# Patient Record
Sex: Female | Born: 1935 | Race: White | Hispanic: No | Smoking: Former smoker
Health system: Southern US, Community
[De-identification: ages and names within clinical notes are randomized; demographics above are authoritative.]

## PROBLEM LIST (undated history)

## (undated) DIAGNOSIS — J45909 Unspecified asthma, uncomplicated: Secondary | ICD-10-CM

## (undated) DIAGNOSIS — Z860100 Personal history of colon polyps, unspecified: Secondary | ICD-10-CM

## (undated) DIAGNOSIS — R319 Hematuria, unspecified: Secondary | ICD-10-CM

## (undated) DIAGNOSIS — K222 Esophageal obstruction: Secondary | ICD-10-CM

## (undated) DIAGNOSIS — M109 Gout, unspecified: Secondary | ICD-10-CM

## (undated) DIAGNOSIS — D649 Anemia, unspecified: Secondary | ICD-10-CM

## (undated) DIAGNOSIS — K589 Irritable bowel syndrome without diarrhea: Secondary | ICD-10-CM

## (undated) DIAGNOSIS — E119 Type 2 diabetes mellitus without complications: Secondary | ICD-10-CM

## (undated) DIAGNOSIS — G4730 Sleep apnea, unspecified: Secondary | ICD-10-CM

## (undated) DIAGNOSIS — C189 Malignant neoplasm of colon, unspecified: Secondary | ICD-10-CM

## (undated) DIAGNOSIS — I10 Essential (primary) hypertension: Secondary | ICD-10-CM

## (undated) DIAGNOSIS — T8859XA Other complications of anesthesia, initial encounter: Secondary | ICD-10-CM

## (undated) DIAGNOSIS — E039 Hypothyroidism, unspecified: Secondary | ICD-10-CM

## (undated) DIAGNOSIS — R0600 Dyspnea, unspecified: Secondary | ICD-10-CM

## (undated) DIAGNOSIS — R079 Chest pain, unspecified: Secondary | ICD-10-CM

## (undated) DIAGNOSIS — E785 Hyperlipidemia, unspecified: Secondary | ICD-10-CM

## (undated) DIAGNOSIS — Z8739 Personal history of other diseases of the musculoskeletal system and connective tissue: Secondary | ICD-10-CM

## (undated) DIAGNOSIS — C801 Malignant (primary) neoplasm, unspecified: Secondary | ICD-10-CM

## (undated) DIAGNOSIS — Z87898 Personal history of other specified conditions: Secondary | ICD-10-CM

## (undated) DIAGNOSIS — I350 Nonrheumatic aortic (valve) stenosis: Secondary | ICD-10-CM

## (undated) HISTORY — PX: CHOLECYSTECTOMY: SHX55

## (undated) HISTORY — PX: COLONOSCOPY: SHX174

## (undated) HISTORY — PX: ESOPHAGOGASTRODUODENOSCOPY: SHX1529

## (undated) HISTORY — PX: CARDIAC PACEMAKER PLACEMENT: SHX583

## (undated) HISTORY — PX: AORTIC VALVE REPLACEMENT: SHX41

---

## 1995-02-08 HISTORY — PX: COLON SURGERY: SHX602

## 2004-05-20 ENCOUNTER — Emergency Department: Payer: Self-pay | Admitting: Emergency Medicine

## 2004-06-08 ENCOUNTER — Ambulatory Visit: Payer: Self-pay

## 2004-07-08 ENCOUNTER — Ambulatory Visit: Payer: Self-pay | Admitting: Internal Medicine

## 2004-07-30 ENCOUNTER — Ambulatory Visit: Payer: Self-pay | Admitting: Internal Medicine

## 2004-08-07 ENCOUNTER — Ambulatory Visit: Payer: Self-pay | Admitting: Internal Medicine

## 2005-09-13 ENCOUNTER — Ambulatory Visit: Payer: Self-pay | Admitting: Internal Medicine

## 2005-09-16 ENCOUNTER — Ambulatory Visit: Payer: Self-pay | Admitting: Internal Medicine

## 2005-12-02 ENCOUNTER — Ambulatory Visit: Payer: Self-pay | Admitting: Unknown Physician Specialty

## 2006-03-13 ENCOUNTER — Ambulatory Visit: Payer: Self-pay | Admitting: Internal Medicine

## 2006-04-03 ENCOUNTER — Ambulatory Visit: Payer: Self-pay | Admitting: Vascular Surgery

## 2006-05-01 ENCOUNTER — Ambulatory Visit: Payer: Self-pay | Admitting: General Surgery

## 2006-12-15 ENCOUNTER — Ambulatory Visit: Payer: Self-pay | Admitting: General Surgery

## 2007-12-24 ENCOUNTER — Ambulatory Visit: Payer: Self-pay | Admitting: Internal Medicine

## 2008-11-14 ENCOUNTER — Ambulatory Visit: Payer: Self-pay | Admitting: Otolaryngology

## 2008-12-25 ENCOUNTER — Ambulatory Visit: Payer: Self-pay | Admitting: Internal Medicine

## 2009-12-28 ENCOUNTER — Ambulatory Visit: Payer: Self-pay | Admitting: Internal Medicine

## 2010-02-24 ENCOUNTER — Inpatient Hospital Stay: Payer: Self-pay | Admitting: Internal Medicine

## 2011-01-06 ENCOUNTER — Ambulatory Visit: Payer: Self-pay | Admitting: Internal Medicine

## 2011-03-07 ENCOUNTER — Ambulatory Visit: Payer: Self-pay | Admitting: Unknown Physician Specialty

## 2012-01-19 ENCOUNTER — Ambulatory Visit: Payer: Self-pay | Admitting: Internal Medicine

## 2013-01-21 ENCOUNTER — Ambulatory Visit: Payer: Self-pay | Admitting: Family Medicine

## 2013-02-05 ENCOUNTER — Ambulatory Visit: Payer: Self-pay | Admitting: Internal Medicine

## 2013-03-06 DIAGNOSIS — E782 Mixed hyperlipidemia: Secondary | ICD-10-CM | POA: Insufficient documentation

## 2013-03-08 DIAGNOSIS — Z952 Presence of prosthetic heart valve: Secondary | ICD-10-CM | POA: Insufficient documentation

## 2013-03-08 HISTORY — PX: TEE WITHOUT CARDIOVERSION: SHX5443

## 2013-03-13 DIAGNOSIS — I442 Atrioventricular block, complete: Secondary | ICD-10-CM | POA: Insufficient documentation

## 2013-03-13 DIAGNOSIS — I444 Left anterior fascicular block: Secondary | ICD-10-CM | POA: Insufficient documentation

## 2013-03-21 ENCOUNTER — Encounter: Payer: Self-pay | Admitting: Internal Medicine

## 2013-03-26 LAB — BASIC METABOLIC PANEL
ANION GAP: 8 (ref 7–16)
BUN: 37 mg/dL — ABNORMAL HIGH (ref 7–18)
CHLORIDE: 105 mmol/L (ref 98–107)
CREATININE: 1.49 mg/dL — AB (ref 0.60–1.30)
Calcium, Total: 8.9 mg/dL (ref 8.5–10.1)
Co2: 26 mmol/L (ref 21–32)
EGFR (Non-African Amer.): 34 — ABNORMAL LOW
GFR CALC AF AMER: 39 — AB
GLUCOSE: 83 mg/dL (ref 65–99)
Osmolality: 285 (ref 275–301)
POTASSIUM: 3.9 mmol/L (ref 3.5–5.1)
Sodium: 139 mmol/L (ref 136–145)

## 2013-03-26 LAB — PROTIME-INR
INR: 3.4
Prothrombin Time: 33.2 secs — ABNORMAL HIGH (ref 11.5–14.7)

## 2013-03-29 LAB — PROTIME-INR
INR: 2.2
Prothrombin Time: 23.8 secs — ABNORMAL HIGH (ref 11.5–14.7)

## 2013-04-02 LAB — PROTIME-INR
INR: 2.1
Prothrombin Time: 23.3 secs — ABNORMAL HIGH (ref 11.5–14.7)

## 2013-04-07 ENCOUNTER — Encounter: Payer: Self-pay | Admitting: Internal Medicine

## 2013-04-26 ENCOUNTER — Emergency Department: Payer: Self-pay | Admitting: Emergency Medicine

## 2013-08-01 LAB — CBC WITH DIFFERENTIAL/PLATELET
Basophil #: 0.1 10*3/uL (ref 0.0–0.1)
Basophil %: 0.6 %
EOS ABS: 0.2 10*3/uL (ref 0.0–0.7)
Eosinophil %: 1 %
HCT: 49.2 % — ABNORMAL HIGH (ref 35.0–47.0)
HGB: 15.7 g/dL (ref 12.0–16.0)
LYMPHS PCT: 5.4 %
Lymphocyte #: 1 10*3/uL (ref 1.0–3.6)
MCH: 26.4 pg (ref 26.0–34.0)
MCHC: 32 g/dL (ref 32.0–36.0)
MCV: 83 fL (ref 80–100)
Monocyte #: 1.6 x10 3/mm — ABNORMAL HIGH (ref 0.2–0.9)
Monocyte %: 8.5 %
NEUTROS PCT: 84.5 %
Neutrophil #: 16.2 10*3/uL — ABNORMAL HIGH (ref 1.4–6.5)
Platelet: 230 10*3/uL (ref 150–440)
RBC: 5.96 10*6/uL — ABNORMAL HIGH (ref 3.80–5.20)
RDW: 16.4 % — ABNORMAL HIGH (ref 11.5–14.5)
WBC: 19.1 10*3/uL — ABNORMAL HIGH (ref 3.6–11.0)

## 2013-08-02 ENCOUNTER — Observation Stay: Payer: Self-pay | Admitting: Internal Medicine

## 2013-08-02 LAB — CBC WITH DIFFERENTIAL/PLATELET
Basophil #: 0 10*3/uL (ref 0.0–0.1)
Basophil %: 0.7 %
EOS ABS: 0.1 10*3/uL (ref 0.0–0.7)
Eosinophil %: 1.4 %
HCT: 38.7 % (ref 35.0–47.0)
HGB: 12.6 g/dL (ref 12.0–16.0)
LYMPHS ABS: 1.9 10*3/uL (ref 1.0–3.6)
Lymphocyte %: 25.9 %
MCH: 26.6 pg (ref 26.0–34.0)
MCHC: 32.5 g/dL (ref 32.0–36.0)
MCV: 82 fL (ref 80–100)
Monocyte #: 1 x10 3/mm — ABNORMAL HIGH (ref 0.2–0.9)
Monocyte %: 13.4 %
NEUTROS ABS: 4.2 10*3/uL (ref 1.4–6.5)
NEUTROS PCT: 58.6 %
Platelet: 200 10*3/uL (ref 150–440)
RBC: 4.74 10*6/uL (ref 3.80–5.20)
RDW: 16.4 % — AB (ref 11.5–14.5)
WBC: 7.2 10*3/uL (ref 3.6–11.0)

## 2013-08-02 LAB — COMPREHENSIVE METABOLIC PANEL
ALK PHOS: 149 U/L — AB
ALT: 56 U/L (ref 12–78)
Albumin: 3.8 g/dL (ref 3.4–5.0)
Anion Gap: 10 (ref 7–16)
BUN: 22 mg/dL — ABNORMAL HIGH (ref 7–18)
Bilirubin,Total: 0.6 mg/dL (ref 0.2–1.0)
CALCIUM: 9.6 mg/dL (ref 8.5–10.1)
CO2: 23 mmol/L (ref 21–32)
Chloride: 104 mmol/L (ref 98–107)
Creatinine: 1.67 mg/dL — ABNORMAL HIGH (ref 0.60–1.30)
EGFR (African American): 34 — ABNORMAL LOW
GFR CALC NON AF AMER: 29 — AB
Glucose: 196 mg/dL — ABNORMAL HIGH (ref 65–99)
Osmolality: 283 (ref 275–301)
Potassium: 4 mmol/L (ref 3.5–5.1)
SGOT(AST): 41 U/L — ABNORMAL HIGH (ref 15–37)
Sodium: 137 mmol/L (ref 136–145)
TOTAL PROTEIN: 8.6 g/dL — AB (ref 6.4–8.2)

## 2013-08-02 LAB — TROPONIN I: Troponin-I: <0.02 ng/mL

## 2013-08-02 LAB — BASIC METABOLIC PANEL
ANION GAP: 6 — AB (ref 7–16)
BUN: 26 mg/dL — ABNORMAL HIGH (ref 7–18)
CALCIUM: 8.2 mg/dL — AB (ref 8.5–10.1)
Chloride: 108 mmol/L — ABNORMAL HIGH (ref 98–107)
Co2: 27 mmol/L (ref 21–32)
Creatinine: 1.72 mg/dL — ABNORMAL HIGH (ref 0.60–1.30)
EGFR (African American): 32 — ABNORMAL LOW
EGFR (Non-African Amer.): 28 — ABNORMAL LOW
GLUCOSE: 103 mg/dL — AB (ref 65–99)
Osmolality: 286 (ref 275–301)
POTASSIUM: 3.7 mmol/L (ref 3.5–5.1)
SODIUM: 141 mmol/L (ref 136–145)

## 2013-08-02 LAB — URINALYSIS, COMPLETE
BILIRUBIN, UR: NEGATIVE
Blood: NEGATIVE
GLUCOSE, UR: NEGATIVE mg/dL (ref 0–75)
Hyaline Cast: 11
KETONE: NEGATIVE
NITRITE: NEGATIVE
Ph: 5 (ref 4.5–8.0)
Protein: 25
RBC,UR: 3 /HPF (ref 0–5)
Specific Gravity: 1.015 (ref 1.003–1.030)
Squamous Epithelial: 1

## 2013-08-02 LAB — AMYLASE: AMYLASE: 112 U/L (ref 25–115)

## 2013-08-02 LAB — PROTIME-INR
INR: 1.5
INR: 1.4
PROTHROMBIN TIME: 17 s — AB (ref 11.5–14.7)
Prothrombin Time: 17.7 secs — ABNORMAL HIGH (ref 11.5–14.7)

## 2013-08-02 LAB — LIPASE, BLOOD
LIPASE: 877 U/L — AB (ref 73–393)
Lipase: 161 U/L (ref 73–393)

## 2013-08-02 LAB — CLOSTRIDIUM DIFFICILE(ARMC)

## 2013-08-03 LAB — CBC WITH DIFFERENTIAL/PLATELET
BASOS ABS: 0.1 10*3/uL (ref 0.0–0.1)
Basophil %: 1.3 %
Eosinophil #: 0.3 10*3/uL (ref 0.0–0.7)
Eosinophil %: 6.9 %
HCT: 36.5 % (ref 35.0–47.0)
HGB: 11.9 g/dL — AB (ref 12.0–16.0)
LYMPHS PCT: 36.6 %
Lymphocyte #: 1.8 10*3/uL (ref 1.0–3.6)
MCH: 26.8 pg (ref 26.0–34.0)
MCHC: 32.5 g/dL (ref 32.0–36.0)
MCV: 82 fL (ref 80–100)
MONO ABS: 0.6 x10 3/mm (ref 0.2–0.9)
Monocyte %: 12.2 %
NEUTROS PCT: 43 %
Neutrophil #: 2.1 10*3/uL (ref 1.4–6.5)
PLATELETS: 168 10*3/uL (ref 150–440)
RBC: 4.44 10*6/uL (ref 3.80–5.20)
RDW: 16.6 % — AB (ref 11.5–14.5)
WBC: 4.9 10*3/uL (ref 3.6–11.0)

## 2013-08-03 LAB — BASIC METABOLIC PANEL
Anion Gap: 8 (ref 7–16)
BUN: 20 mg/dL — ABNORMAL HIGH (ref 7–18)
CHLORIDE: 112 mmol/L — AB (ref 98–107)
Calcium, Total: 8.3 mg/dL — ABNORMAL LOW (ref 8.5–10.1)
Co2: 23 mmol/L (ref 21–32)
Creatinine: 1.34 mg/dL — ABNORMAL HIGH (ref 0.60–1.30)
EGFR (African American): 44 — ABNORMAL LOW
EGFR (Non-African Amer.): 38 — ABNORMAL LOW
GLUCOSE: 87 mg/dL (ref 65–99)
Osmolality: 287 (ref 275–301)
POTASSIUM: 3.7 mmol/L (ref 3.5–5.1)
Sodium: 143 mmol/L (ref 136–145)

## 2013-08-03 LAB — MAGNESIUM: MAGNESIUM: 1.8 mg/dL

## 2013-08-03 LAB — PROTIME-INR
INR: 1.6
Prothrombin Time: 18.4 secs — ABNORMAL HIGH (ref 11.5–14.7)

## 2013-08-03 LAB — LIPASE, BLOOD: Lipase: 113 U/L (ref 73–393)

## 2013-08-07 LAB — CULTURE, BLOOD (SINGLE)

## 2013-09-29 ENCOUNTER — Emergency Department: Payer: Self-pay | Admitting: Student

## 2013-09-29 LAB — COMPREHENSIVE METABOLIC PANEL
ALBUMIN: 3.2 g/dL — AB (ref 3.4–5.0)
ALT: 40 U/L
AST: 48 U/L — AB (ref 15–37)
Alkaline Phosphatase: 96 U/L
Anion Gap: 10 (ref 7–16)
BUN: 20 mg/dL — AB (ref 7–18)
Bilirubin,Total: 0.9 mg/dL (ref 0.2–1.0)
CALCIUM: 8.4 mg/dL — AB (ref 8.5–10.1)
Chloride: 105 mmol/L (ref 98–107)
Co2: 25 mmol/L (ref 21–32)
Creatinine: 1.43 mg/dL — ABNORMAL HIGH (ref 0.60–1.30)
EGFR (African American): 41 — ABNORMAL LOW
EGFR (Non-African Amer.): 35 — ABNORMAL LOW
Glucose: 153 mg/dL — ABNORMAL HIGH (ref 65–99)
Osmolality: 285 (ref 275–301)
POTASSIUM: 4.1 mmol/L (ref 3.5–5.1)
Sodium: 140 mmol/L (ref 136–145)
TOTAL PROTEIN: 7.6 g/dL (ref 6.4–8.2)

## 2013-09-29 LAB — URINALYSIS, COMPLETE
BLOOD: NEGATIVE
Bacteria: NONE SEEN
Bilirubin,UR: NEGATIVE
Glucose,UR: NEGATIVE mg/dL (ref 0–75)
Hyaline Cast: 2
Ketone: NEGATIVE
Leukocyte Esterase: NEGATIVE
NITRITE: NEGATIVE
Ph: 5 (ref 4.5–8.0)
Protein: NEGATIVE
RBC,UR: 1 /HPF (ref 0–5)
SPECIFIC GRAVITY: 1.016 (ref 1.003–1.030)
Squamous Epithelial: 2
WBC UR: <1 /HPF (ref 0–5)

## 2013-09-29 LAB — PROTIME-INR
INR: 1.8
Prothrombin Time: 20.2 secs — ABNORMAL HIGH (ref 11.5–14.7)

## 2013-09-29 LAB — CBC
HCT: 38.5 % (ref 35.0–47.0)
HGB: 12.6 g/dL (ref 12.0–16.0)
MCH: 27.7 pg (ref 26.0–34.0)
MCHC: 32.6 g/dL (ref 32.0–36.0)
MCV: 85 fL (ref 80–100)
PLATELETS: 198 10*3/uL (ref 150–440)
RBC: 4.54 10*6/uL (ref 3.80–5.20)
RDW: 15.7 % — ABNORMAL HIGH (ref 11.5–14.5)
WBC: 10.3 10*3/uL (ref 3.6–11.0)

## 2013-09-29 LAB — LIPASE, BLOOD: Lipase: 136 U/L (ref 73–393)

## 2013-09-29 LAB — HEPATIC FUNCTION PANEL A (ARMC): Bilirubin, Direct: 0.3 mg/dL — ABNORMAL HIGH (ref 0.00–0.20)

## 2013-09-29 LAB — TROPONIN I

## 2014-01-22 ENCOUNTER — Ambulatory Visit: Payer: Self-pay | Admitting: Family Medicine

## 2014-01-29 ENCOUNTER — Ambulatory Visit: Payer: Self-pay | Admitting: Family Medicine

## 2014-05-31 NOTE — H&P (Signed)
PATIENT NAME:  Krystal Goodwin, Krystal Goodwin MR#:  [id number] DATE OF BIRTH:  1933/11/16  DATE OF ADMISSION:  08/02/2013  ADDENDUM  ASSESSMENT AND PLAN: Leukocytosis: The patient is afebrile. There is no acute finding on her CT abdomen and pelvis, as well there is no acute finding on the chest x-ray, so this is most likely reactive secondary to her vomiting. As well, we will hold on antibiotics as there is no apparent infection. Will check urinalysis.   ____________________________ Albertine Patricia, MD dse:sb D: 08/02/2013 05:24:49 ET T: 08/02/2013 08:10:31 ET JOB#: [id number]  cc: Albertine Patricia, MD, <Dictator> DAWOOD Graciela Husbands MD ELECTRONICALLY SIGNED 08/02/2013 20:36

## 2014-05-31 NOTE — Discharge Summary (Signed)
PATIENT NAME:  Krystal Goodwin, Krystal Goodwin MR#:  [id number] DATE OF BIRTH:  Jan 10, 1934  DATE OF ADMISSION:  08/02/2013 DATE OF DISCHARGE:  08/03/2013  ADMITTING DIAGNOSIS: Acute gastroenteritis.  DISCHARGE DIAGNOSES:  1.  Acute gastroenteritis, likely viral. 2.  Urinary tract infection, unknown etiology at this time. Urine cultures are pending. 3.  Acute on likely chronic renal failure.  4.  Atrial fibrillation on Coumadin therapy, subtherapeutic with INR of 1.6 today, on 08/03/2013. 5.  Hypertension.  6.  Diabetes mellitus.  7.  Gout.  8.  Hypothyroidism.  9.  Gastroesophageal reflux disease.  10.  Recent aortic valve replacement with bovine valve.  11.  Sinus syndrome, status post permanent pacemaker placement.  12.  Chronic diastolic congestive heart failure, stable.    DISCHARGE CONDITION: Stable.   DISCHARGE MEDICATIONS: The patient is to continue amlodipine 10 mg p.o. daily, levothyroxine 75 mcg p.o. daily, glipizide XL 10 mg p.o. daily, warfarin 3.5 mg once daily, aspirin 81 mg p.o. daily, iron sulfate 325 mg p.o. daily, Lasix 40 mg p.o. daily, metoprolol succinate 25 mg p.o. daily, Protonix 40 mg p.o. daily, potassium chloride 20, 2 tablets once daily, multivitamins once daily, melatonin 5 mg p.o. at bedtime, alprazolam 0.5 mg p.o. daily at bedtime, Levofloxacin 250 mg p.o. daily for five more days. The patient is not to take senna +50/8.6.   HOME OXYGEN: None.   DIET: 2 grams salt, low-fat, low-cholesterol, carbohydrate-controlled diet, mechanical soft, low-residue diet. The patient was advised to drink plenty of fluids and stop Lasix and potassium supplements if diarrhea recurs. She is to follow up with Dr. Loney Hering in two days after discharge.   CONSULTANTS: Care management, social work.   RADIOLOGIC STUDIES: Chest x-ray, PA and lateral, 08/02/2013 revealed indistinctness of pulmonary vasculature versus cephalization of blood flow, suggesting pulmonary venous hypertension, although no  cardiomegaly or overt edema was identified. Interval aortic valve prosthesis was noted. Small hiatal hernia and dual-lead pacemaker noted. Chronically calcified right axillary lymph node. CT scan of head without contrast, 08/02/2013, due to dizziness, showed no acute abnormalities, chronic atrophy and small vessel ischemic disease. CT of abdomen and pelvis without contrast, 08/02/2013, revealed large esophageal hiatal hernia, stable appearance, with findings of tiny nodules in the lung bases, present reactive lymph nodes, no focal acute process was demonstrated.   HISTORY AND HOSPITAL COURSE: The patient is a 81 year old Caucasian female with past medical history significant for history of recent aortic valve replacement, who presents to the hospital with complaints of abdominal pain, nausea, as well as vomiting, as well as diarrhea. Please refer to Dr. Graciela Husbands admission note on 08/02/2013. On arrival to the hospital, the patient's temperature was 98.2, pulse was 73, respiration rate was 20, blood pressure 133/60, saturation was 97% on room air. Physical exam was unremarkable.   The patient's lab data done on arrival to the hospital, 08/02/2013, showed elevation of BUN as well as creatinine to 22 and 1.67, glucose 196. Otherwise BMP was unremarkable. The patient's lipase level was evaluated at 877, total protein was 8.6, alkaline phosphatase 149, AST was 41. Otherwise liver enzymes were normal. Troponin was less than 0.02. White blood cell count was elevated 19.1, hemoglobin was 15.7, and platelet count was 230. Absolute neutrophil count was 16.2. Coagulation panel shows pro time of 17.7 and INR was 1.5. The patient's blood cultures taken on 08/02/2013 showed no growth. C. diff test was negative. Enteric pathogen test was pending. Urinalysis was remarkable for 34 white blood cells, 3 red blood  cells, 2+ leukocyte esterase, trace bacteria.  The patient was admitted to the hospital for further evaluation.  She was initiated on IV fluids, and her Lasix was suspended. With hydration as well as supportive therapy, antiemetics, the patient's condition improved, and her diarrhea resolved as time progressed. She was able to tolerate a full liquid diet which was advanced to a soft diet by the day of discharge. On 08/03/2013, she was able to eat a full liquid diet, and recommended to advance to soft low-residue diet at home. She was slowly to advance her diet to a regular diet, as long as she is able to tolerate. She was advised to continue high fluid intake if her diarrhea recurs. She was rehydrated, and her kidney function improved.   On the day of admission, 08/02/2013, the patient's creatinine was 1.57. It even worsened to 1.72 on 08/02/2013. However, it improved to 1.34 by the day of discharge, 08/03/2013. It is recommended to follow the patient's creatinine level and make decisions about holding her diuretics if her kidney function worsens again. The patient was felt to have, also, a urinary tract infection. She was initiated on Levaquin, and her condition improved. The patient was advised to continue antibiotic therapy for five more days to complete course.   In regards to her chronic medical problems including A. fib, hypertension, diabetes, gout, hypothyroidism, gastroesophageal reflux disease, aortic replacement, chronic diastolic congestive heart failure, the patient was advised to continue her outpatient medications. In regards to Coumadin use, it was felt that the patient's Coumadin dose should not be advanced at this point, as she is going to be on antibiotic therapy. It is recommended, however, to follow the patient's Coumadin levels as outpatient in the next two days after discharge, and make decisions about holding Coumadin therapy if needed, if her pro time is significantly elevated. On the day of discharge 08/03/2013, the patient's INR is 1.6. The patient has dysarthria on the day of discharge,  08/03/2013. She was able to eat, as mentioned above, and her vitals were stable. On the day of discharge, the patient's temperature was 97.6, pulse is 70, respiration was 18, blood pressure 120/68, saturation was 97% on room air at rest.   TIME SPENT: 40 minutes.   ____________________________ Theodoro Grist, MD rv:cg D: 08/03/2013 16:10:29 ET T: 08/04/2013 05:56:59 ET JOB#: [id number]  cc: Theodoro Grist, MD, <Dictator> Derinda Late, MD [place] MD ELECTRONICALLY SIGNED 08/26/2013 16:05

## 2014-05-31 NOTE — H&P (Signed)
PATIENT NAME:  Krystal Goodwin, GALAS MR#:  [id number] DATE OF BIRTH:  1933/07/25  DATE OF ADMISSION:  08/02/2013.  REFERRING PHYSICIAN:  Connye Burkitt. Lovena Le, M.D.    CHIEF COMPLAINT: Abdominal pain, nausea and vomiting.   HISTORY OF PRESENT ILLNESS: This is a 81 year old female with known past medical history of diabetes mellitus, hypertension, obstructive sleep apnea, hypothyroidism, and recent admission to [facility] in January of this year where she had bovine aortic valve replacement with diagnosis of atrial fibrillation and where she was started on anticoagulation and had pacemaker insertion, as well. The patient presents with complaints of abdominal pain, nausea and vomiting. She reports that it started yesterday evening. She initially had diarrhea and then abdominal cramps, then nausea and vomiting. The patient had nausea x 1 in the Emergency Department.  CT of the abdomen and pelvis did not show any acute findings. The patient was afebrile but had significant leukocytosis at 19,000. Apparently the patient's pain is much improved and she had no further diarrhea in the Emergency Department. She denies any recent antibiotic use. The patient has chronic kidney disease at baseline, which appears to be stable. Hospitalists were requested to admit the patient and as she appears clinically dehydrated, and for further evaluation of her abdominal pain, nausea and vomiting.   PAST MEDICAL HISTORY:  1.  History of atrial fibrillation on warfarin.  2.  Diabetes mellitus type 2.  3.  Hypertension.  4.  Asthma.  5.  Obstructive sleep apnea, on CPAP.  6.  History of gout.  7.  Hypothyroidism.   PAST SURGICAL HISTORY:  1.  Recent aortic valve replacement surgery with prosthetic valve, per her daughter.  2.  Permanent pacemaker insertion.  3.  Appendectomy.  4.  Cholecystectomy.  5.  Colon resection for a large polyp.   SOCIAL HISTORY: The patient lives at home alone. No alcohol. No illicit drug use. No  smoking.   FAMILY HISTORY: Significant for diabetes and colon cancer in her mother.   ALLERGIES: ERYTHROMYCIN. METFORMIN. VICODIN.   HOME MEDICATIONS:  1.  Aspirin 81 mg daily.  2.  Warfarin 3.5 mg oral daily.  3.  Glipizide XL 10 mg oral daily.  4.  Alprazolam 0.5 mg oral at bedtime.  5.  Metoprolol extended-release 25 mg daily.  6.  Amlodipine 10 mg daily.  7.  Lasix 40 mg daily.  8.  Ferrous sulfate 325 mg oral daily.  9.  Senna Plus 50/8.6 mg 2 tablets 2 times a day.  10. Potassium chloride 20 mEq oral 2 tablets daily.  11. Melatonin 5 mg oral at bedtime.  12. Protonix 40 mg oral daily.  13. Multivitamin 1 tablet daily.  14. Levothyroxine 75 mcg oral daily.   REVIEW OF SYSTEMS:  CONSTITUTIONAL: The patient denies fever or chills. Reports some fatigue, weakness. Denies weight gain, weight loss.  EYES: Denies blurred vision, double vision, inflammation, glaucoma.  ENT: Denies any epistaxis or hearing loss, runny nose.  RESPIRATORY: Denies cough productive of sputum, COPD. CARDIOVASCULAR: Denies chest pain, orthopnea, edema, palpitation.  GASTROINTESTINAL: Reports abdominal pain, nausea and vomiting, and diarrhea. Denies coffee-ground emesis, hematemesis.  GENITOURINARY: Denies dysuria, hematuria, renal colic.  ENDOCRINE: Denies polyuria, polydipsia, heat or cold intolerance.  INTEGUMENTARY: Denies any acne, rash, or skin lesion.  HEMATOLOGIC: Denies anemia, bleeding diathesis.   MUSCULOSKELETAL: No joint effusion. No back pain. Denies any arthritis, any cramps. Reports history of gout.  NEUROLOGIC: Denies CVA, TIA, focal deficits, dizziness.  PSYCHIATRIC: Denies anxiety, insomnia, or  depression.   PHYSICAL EXAMINATION:  VITAL SIGNS: Temperature 98.2, pulse 73, respiratory rate 20, blood pressure 133/60, saturating 97% on room air.  GENERAL: Well-nourished female, looks comfortable in bed, in no apparent distress.  HEENT: Head atraumatic, normocephalic. Pupils equal,  reactive to light. Pink conjunctivae. Anicteric sclerae. Dry oral mucosa.  NECK: Supple. No thyromegaly. No JVD.  CHEST: Good air entry bilaterally. No wheezes, rales, rhonchi.  CARDIOVASCULAR: S1, S2 heard. No rubs, or gallops. Has mild systolic ejection murmur.  ABDOMEN: Soft, nontender, nondistended. Bowel sounds present. No rebound, no guarding.  EXTREMITIES: No edema. No clubbing. No cyanosis. Pedal and radial pulses felt bilaterally.  PSYCHIATRIC: Appropriate affect. Awake, alert x3. Intact judgment and insight.  NEUROLOGIC: Cranial nerves grossly intact. Motor 5/5. No focal deficits.  MUSCULOSKELETAL: No joint effusion or erythema. No lower back tenderness.  LYMPHATIC: No cervical lymphadenopathy.   PERTINENT LABORATORY DATA: Glucose 196, BUN 22, creatinine 1.67, sodium 137, potassium 4, chloride 104, CO2 23, ALT 56, AST 41, alkaline phosphatase 149. Troponin less than 0.02. WBCs 19.1, hemoglobin 15.7, hematocrit 49.2, platelets 230.   IMAGING STUDIES: CT abdomen and pelvis without contrast showing a large spherical hiatal hernia, stable appearance of tiny nodules in the lung bases, present reactive lymph nodes. ASSESSMENT AND PLAN:  1.  Abdominal pain, nausea and vomiting. This is most likely related to her viral gastroenteritis. The patient will be kept on p.r.n. nausea and pain medicine, and we will hydrate her with IV fluids. We will check C. difficile as well, given her leukocytosis, even though it seems unlikely as she does not have any abdominal pain no recent antibiotic exposures; we will keep her on clear liquid diet, advance as tolerated.  2.  Mild acute pancreatitis with mild elevated lipase; unclear if this is pancreatitis or if this is related to her nausea and vomiting. Will keep her on clear liquids and advance as tolerated, will hydrate aggressively.  Will recheck lipase level in 24 hours.  3.  History of atrial fibrillation, the patient is rate controlled, on warfarin.  Continue with metoprolol. We will consult pharmacy to dose warfarin.  4.  Diabetes mellitus. Hold glipizide. Continue with insulin sliding scale.  5.  Hypertension: Blood pressure acceptable. Continue with her medication.  6.  History of obstructive sleep apnea. Continue with CPAP.  7.  Hypothyroidism. Continue with Synthroid.  8.  DVT prophylaxis. Continue with SCDs. The patient is on warfarin.   CODE STATUS: The patient has a living will, reports she is a full code.   TOTAL TIME SPENT:  Admission and patient care: 55 minutes.     ____________________________ Albertine Patricia, MD dse:lt D: 08/02/2013 05:21:59 ET T: 08/02/2013 08:03:09 ET JOB#: [id number]  cc: Albertine Patricia, MD, <Dictator> DAWOOD Graciela Husbands MD ELECTRONICALLY SIGNED 08/02/2013 20:36

## 2014-08-29 DIAGNOSIS — I10 Essential (primary) hypertension: Secondary | ICD-10-CM | POA: Insufficient documentation

## 2015-05-08 ENCOUNTER — Other Ambulatory Visit: Payer: Self-pay | Admitting: Family Medicine

## 2015-05-08 DIAGNOSIS — Z1231 Encounter for screening mammogram for malignant neoplasm of breast: Secondary | ICD-10-CM

## 2015-05-19 ENCOUNTER — Ambulatory Visit
Admission: RE | Admit: 2015-05-19 | Discharge: 2015-05-19 | Disposition: A | Discharge status: Home / Self Care | Payer: Medicare Other | Source: Ambulatory Visit | Attending: Family Medicine | Admitting: Family Medicine

## 2015-05-19 ENCOUNTER — Other Ambulatory Visit: Payer: Self-pay | Admitting: Family Medicine

## 2015-05-19 DIAGNOSIS — Z1231 Encounter for screening mammogram for malignant neoplasm of breast: Secondary | ICD-10-CM | POA: Insufficient documentation

## 2015-05-19 HISTORY — DX: Malignant (primary) neoplasm, unspecified: C80.1

## 2016-03-14 ENCOUNTER — Other Ambulatory Visit: Payer: Self-pay | Admitting: Family Medicine

## 2016-03-14 DIAGNOSIS — Z1231 Encounter for screening mammogram for malignant neoplasm of breast: Secondary | ICD-10-CM

## 2016-05-19 ENCOUNTER — Ambulatory Visit
Admission: RE | Admit: 2016-05-19 | Discharge: 2016-05-19 | Disposition: A | Discharge status: Home / Self Care | Payer: Medicare Other | Source: Ambulatory Visit | Attending: Family Medicine | Admitting: Family Medicine

## 2016-05-19 DIAGNOSIS — Z1231 Encounter for screening mammogram for malignant neoplasm of breast: Secondary | ICD-10-CM | POA: Diagnosis present

## 2016-06-10 DIAGNOSIS — Z86010 Personal history of colonic polyps: Secondary | ICD-10-CM | POA: Insufficient documentation

## 2016-06-24 DIAGNOSIS — M7062 Trochanteric bursitis, left hip: Secondary | ICD-10-CM | POA: Insufficient documentation

## 2016-09-06 ENCOUNTER — Encounter: Payer: Self-pay | Admitting: *Deleted

## 2016-09-07 ENCOUNTER — Encounter: Payer: Self-pay | Admitting: Anesthesiology

## 2016-09-07 ENCOUNTER — Ambulatory Visit
Admission: RE | Admit: 2016-09-07 | Discharge: 2016-09-07 | Disposition: A | Discharge status: Home / Self Care | Payer: Medicare Other | Source: Ambulatory Visit | Attending: Unknown Physician Specialty | Admitting: Unknown Physician Specialty

## 2016-09-07 ENCOUNTER — Ambulatory Visit: Payer: Medicare Other | Admitting: Anesthesiology

## 2016-09-07 ENCOUNTER — Encounter
Admission: RE | Disposition: A | Discharge status: Home / Self Care | Payer: Self-pay | Source: Ambulatory Visit | Attending: Unknown Physician Specialty

## 2016-09-07 DIAGNOSIS — E785 Hyperlipidemia, unspecified: Secondary | ICD-10-CM | POA: Insufficient documentation

## 2016-09-07 DIAGNOSIS — I10 Essential (primary) hypertension: Secondary | ICD-10-CM | POA: Diagnosis not present

## 2016-09-07 DIAGNOSIS — E119 Type 2 diabetes mellitus without complications: Secondary | ICD-10-CM | POA: Insufficient documentation

## 2016-09-07 DIAGNOSIS — Z87891 Personal history of nicotine dependence: Secondary | ICD-10-CM | POA: Insufficient documentation

## 2016-09-07 DIAGNOSIS — D123 Benign neoplasm of transverse colon: Secondary | ICD-10-CM | POA: Diagnosis not present

## 2016-09-07 DIAGNOSIS — G4730 Sleep apnea, unspecified: Secondary | ICD-10-CM | POA: Insufficient documentation

## 2016-09-07 DIAGNOSIS — J45909 Unspecified asthma, uncomplicated: Secondary | ICD-10-CM | POA: Insufficient documentation

## 2016-09-07 DIAGNOSIS — Z1211 Encounter for screening for malignant neoplasm of colon: Secondary | ICD-10-CM | POA: Insufficient documentation

## 2016-09-07 DIAGNOSIS — Z950 Presence of cardiac pacemaker: Secondary | ICD-10-CM | POA: Insufficient documentation

## 2016-09-07 DIAGNOSIS — Z86010 Personal history of colonic polyps: Secondary | ICD-10-CM | POA: Insufficient documentation

## 2016-09-07 DIAGNOSIS — E039 Hypothyroidism, unspecified: Secondary | ICD-10-CM | POA: Insufficient documentation

## 2016-09-07 DIAGNOSIS — Z952 Presence of prosthetic heart valve: Secondary | ICD-10-CM | POA: Diagnosis not present

## 2016-09-07 DIAGNOSIS — M810 Age-related osteoporosis without current pathological fracture: Secondary | ICD-10-CM | POA: Insufficient documentation

## 2016-09-07 DIAGNOSIS — K589 Irritable bowel syndrome without diarrhea: Secondary | ICD-10-CM | POA: Insufficient documentation

## 2016-09-07 DIAGNOSIS — M109 Gout, unspecified: Secondary | ICD-10-CM | POA: Diagnosis not present

## 2016-09-07 DIAGNOSIS — Z79899 Other long term (current) drug therapy: Secondary | ICD-10-CM | POA: Diagnosis not present

## 2016-09-07 DIAGNOSIS — I350 Nonrheumatic aortic (valve) stenosis: Secondary | ICD-10-CM | POA: Diagnosis not present

## 2016-09-07 DIAGNOSIS — K5730 Diverticulosis of large intestine without perforation or abscess without bleeding: Secondary | ICD-10-CM | POA: Diagnosis not present

## 2016-09-07 DIAGNOSIS — K635 Polyp of colon: Secondary | ICD-10-CM | POA: Diagnosis not present

## 2016-09-07 DIAGNOSIS — Z7982 Long term (current) use of aspirin: Secondary | ICD-10-CM | POA: Insufficient documentation

## 2016-09-07 DIAGNOSIS — D509 Iron deficiency anemia, unspecified: Secondary | ICD-10-CM | POA: Insufficient documentation

## 2016-09-07 DIAGNOSIS — Z9049 Acquired absence of other specified parts of digestive tract: Secondary | ICD-10-CM | POA: Insufficient documentation

## 2016-09-07 DIAGNOSIS — Z85038 Personal history of other malignant neoplasm of large intestine: Secondary | ICD-10-CM | POA: Insufficient documentation

## 2016-09-07 DIAGNOSIS — K648 Other hemorrhoids: Secondary | ICD-10-CM | POA: Diagnosis not present

## 2016-09-07 HISTORY — DX: Personal history of colon polyps, unspecified: Z86.0100

## 2016-09-07 HISTORY — DX: Personal history of other specified conditions: Z87.898

## 2016-09-07 HISTORY — DX: Dyspnea, unspecified: R06.00

## 2016-09-07 HISTORY — DX: Nonrheumatic aortic (valve) stenosis: I35.0

## 2016-09-07 HISTORY — DX: Personal history of colonic polyps: Z86.010

## 2016-09-07 HISTORY — DX: Personal history of other diseases of the musculoskeletal system and connective tissue: Z87.39

## 2016-09-07 HISTORY — DX: Anemia, unspecified: D64.9

## 2016-09-07 HISTORY — DX: Unspecified asthma, uncomplicated: J45.909

## 2016-09-07 HISTORY — PX: COLONOSCOPY WITH PROPOFOL: SHX5780

## 2016-09-07 HISTORY — DX: Type 2 diabetes mellitus without complications: E11.9

## 2016-09-07 HISTORY — DX: Sleep apnea, unspecified: G47.30

## 2016-09-07 HISTORY — DX: Essential (primary) hypertension: I10

## 2016-09-07 HISTORY — DX: Gout, unspecified: M10.9

## 2016-09-07 HISTORY — DX: Hematuria, unspecified: R31.9

## 2016-09-07 HISTORY — DX: Hyperlipidemia, unspecified: E78.5

## 2016-09-07 HISTORY — DX: Irritable bowel syndrome, unspecified: K58.9

## 2016-09-07 HISTORY — DX: Hypothyroidism, unspecified: E03.9

## 2016-09-07 HISTORY — DX: Esophageal obstruction: K22.2

## 2016-09-07 HISTORY — DX: Chest pain, unspecified: R07.9

## 2016-09-07 LAB — GLUCOSE, CAPILLARY: Glucose-Capillary: 99 mg/dL (ref 65–99)

## 2016-09-07 SURGERY — COLONOSCOPY WITH PROPOFOL
Anesthesia: General

## 2016-09-07 MED ORDER — PROPOFOL 500 MG/50ML IV EMUL
INTRAVENOUS | Status: DC | PRN
  Administered 2016-09-07: 50 ug/kg/min via INTRAVENOUS

## 2016-09-07 MED ORDER — VANCOMYCIN HCL IN DEXTROSE 1-5 GM/200ML-% IV SOLN
INTRAVENOUS | Status: AC
  Filled 2016-09-07: qty 200

## 2016-09-07 MED ORDER — GENTAMICIN IN SALINE 1-0.9 MG/ML-% IV SOLN
100.0000 mg | Freq: Once | INTRAVENOUS | Status: DC
  Filled 2016-09-07: qty 100

## 2016-09-07 MED ORDER — LIDOCAINE HCL (PF) 2 % IJ SOLN
INTRAMUSCULAR | Status: AC
  Filled 2016-09-07: qty 2

## 2016-09-07 MED ORDER — GENTAMICIN SULFATE 40 MG/ML IJ SOLN
100.0000 mg | Freq: Once | INTRAVENOUS | Status: AC
  Administered 2016-09-07: 100 mg via INTRAVENOUS
  Filled 2016-09-07: qty 2.5

## 2016-09-07 MED ORDER — SODIUM CHLORIDE 0.9 % IV SOLN: INTRAVENOUS | Status: DC

## 2016-09-07 MED ORDER — SODIUM CHLORIDE 0.9 % IV SOLN
INTRAVENOUS | Status: DC
  Administered 2016-09-07: 08:00:00 via INTRAVENOUS

## 2016-09-07 MED ORDER — FENTANYL CITRATE (PF) 100 MCG/2ML IJ SOLN
INTRAMUSCULAR | Status: AC
  Filled 2016-09-07: qty 2

## 2016-09-07 MED ORDER — PHENYLEPHRINE HCL 10 MG/ML IJ SOLN
INTRAMUSCULAR | Status: AC
  Filled 2016-09-07: qty 1

## 2016-09-07 MED ORDER — MIDAZOLAM HCL 2 MG/2ML IJ SOLN
INTRAMUSCULAR | Status: AC
  Filled 2016-09-07: qty 2

## 2016-09-07 MED ORDER — PROPOFOL 500 MG/50ML IV EMUL
INTRAVENOUS | Status: AC
  Filled 2016-09-07: qty 50

## 2016-09-07 MED ORDER — VANCOMYCIN HCL IN DEXTROSE 1-5 GM/200ML-% IV SOLN
INTRAVENOUS | Status: AC
  Administered 2016-09-07: 1000 mg via INTRAVENOUS
  Filled 2016-09-07: qty 200

## 2016-09-07 MED ORDER — EPHEDRINE SULFATE 50 MG/ML IJ SOLN
INTRAMUSCULAR | Status: AC
  Filled 2016-09-07: qty 1

## 2016-09-07 MED ORDER — DIPHENHYDRAMINE HCL 50 MG/ML IJ SOLN
INTRAMUSCULAR | Status: DC | PRN
  Administered 2016-09-07: 6.25 mg via INTRAVENOUS

## 2016-09-07 MED ORDER — FENTANYL CITRATE (PF) 100 MCG/2ML IJ SOLN
INTRAMUSCULAR | Status: DC | PRN
  Administered 2016-09-07: 50 ug via INTRAVENOUS
  Administered 2016-09-07 (×2): 25 ug via INTRAVENOUS

## 2016-09-07 MED ORDER — PROPOFOL 10 MG/ML IV BOLUS
INTRAVENOUS | Status: DC | PRN
  Administered 2016-09-07: 30 mg via INTRAVENOUS

## 2016-09-07 MED ORDER — MIDAZOLAM HCL 5 MG/5ML IJ SOLN
INTRAMUSCULAR | Status: DC | PRN
  Administered 2016-09-07 (×2): 1 mg via INTRAVENOUS

## 2016-09-07 MED ORDER — LIDOCAINE HCL (PF) 2 % IJ SOLN
INTRAMUSCULAR | Status: DC | PRN
  Administered 2016-09-07: 50 mg

## 2016-09-07 MED ORDER — VANCOMYCIN HCL IN DEXTROSE 1-5 GM/200ML-% IV SOLN
1000.0000 mg | Freq: Once | INTRAVENOUS | Status: AC
  Administered 2016-09-07: 1000 mg via INTRAVENOUS
  Filled 2016-09-07: qty 200

## 2016-09-07 NOTE — H&P (Signed)
Primary Care Physician:  Derinda Late, MD Primary Gastroenterologist:  Dr. Vira Agar  Pre-Procedure History & Physical: HPI:  Krystal Goodwin is a 83 y.o. female is here for an colonoscopy.   Past Medical History:  Diagnosis Date  . Anemia    iron def.anemia  . Aortic stenosis, severe   . Asthma   . Cancer ([place])    colon  . Chest pain   . Diabetes mellitus without complication ([place])   . Dyslipidemia   . Dyspnea   . Gout, joint   . H/O postmenopausal osteoporosis   . Hematuria   . History of palpitations   . Hx of colonic polyps   . Hypertension   . Hypothyroidism   . IBS (irritable bowel syndrome)   . Schatzki's ring   . Sleep apnea     Past Surgical History:  Procedure Laterality Date  . AORTIC VALVE REPLACEMENT    . CARDIAC PACEMAKER PLACEMENT    . CHOLECYSTECTOMY    . COLON SURGERY  1997   RIGHT HEMICOLECTOMY  . COLONOSCOPY    . ESOPHAGOGASTRODUODENOSCOPY    . TEE WITHOUT CARDIOVERSION  03/08/2013    Prior to Admission medications   Medication Sig Start Date End Date Taking? Authorizing Provider  ALPRAZolam Duanne Moron) 0.5 MG tablet Take 0.5 mg by mouth at bedtime as needed for anxiety.   Yes [provider]  amLODipine (NORVASC) 10 MG tablet Take 10 mg by mouth daily.   Yes [provider]  aspirin EC 81 MG tablet Take 81 mg by mouth daily.   Yes [provider]  budesonide-formoterol (SYMBICORT) 160-4.5 MCG/ACT inhaler Inhale 2 puffs into the lungs 2 (two) times daily.   Yes [provider]  ferrous sulfate 325 (65 FE) MG tablet Take 325 mg by mouth daily with breakfast.   Yes [provider]  furosemide (LASIX) 40 MG tablet Take 40 mg by mouth.   Yes [provider]  glipiZIDE (GLUCOTROL XL) 10 MG 24 hr tablet Take 10 mg by mouth daily with breakfast.   Yes [provider]  levothyroxine (SYNTHROID, LEVOTHROID) 88 MCG tablet Take 88 mcg by mouth daily before breakfast.   Yes [provider]   losartan (COZAAR) 100 MG tablet Take 100 mg by mouth daily.   Yes [provider]  Melatonin-Pyridoxine 1-10 MG TABS Take by mouth at bedtime as needed.   Yes [provider]  Multiple Vitamin (MULTIVITAMIN) tablet Take 1 tablet by mouth daily.   Yes [provider]  pantoprazole (PROTONIX) 40 MG tablet Take 40 mg by mouth daily.   Yes [provider]  PARoxetine (PAXIL) 20 MG tablet Take 20 mg by mouth daily.    [provider]  traMADol (ULTRAM) 50 MG tablet Take 50 mg by mouth every 6 (six) hours as needed.    [provider]    Allergies as of 07/14/2016  . (Not on File)    Family History  Problem Relation Age of Onset  . Breast cancer Neg Hx     Social History   Social History  . Marital status: Widowed    Spouse name: N/A  . Number of children: N/A  . Years of education: N/A   Occupational History  . Not on file.   Social History Main Topics  . Smoking status: Former Smoker    Packs/day: 1.00    Years: 20.00    Types: Cigarettes    Quit date: 02/21/1989  . Smokeless tobacco:  Never Used  . Alcohol use No  . Drug use: No  . Sexual activity: Not on file   Other Topics Concern  . Not on file   Social History Narrative  . No narrative on file    Review of Systems: See HPI, otherwise negative ROS  Physical Exam: BP (!) 128/58   Pulse 92   Temp 98.8 F (37.1 C) (Tympanic)   Resp 20   Ht 5\' 5"  (1.651 m)   Wt 90.7 kg (200 lb)   SpO2 99%   BMI 33.28 kg/m  General:   Alert,  pleasant and cooperative in NAD Head:  Normocephalic and atraumatic. Neck:  Supple; no masses or thyromegaly. Lungs:  Clear throughout to auscultation.    Heart:  Regular rate and rhythm. Abdomen:  Soft, nontender and nondistended. Normal bowel sounds, without guarding, and without rebound.   Neurologic:  Alert and  oriented x4;  grossly normal neurologically.  Impression/Plan: [name] is here for an colonoscopy to be  performed for [facility] colon polyp removal.  Risks, benefits, limitations, and alternatives regarding  colonoscopy have been reviewed with the patient.  Questions have been answered.  All parties agreeable.   Gaylyn Cheers, MD  09/07/2016, 8:57 AM

## 2016-09-07 NOTE — Transfer of Care (Signed)
Immediate Anesthesia Transfer of Care Note  Patient: Krystal Goodwin  Procedure(s) Performed: Procedure(s): COLONOSCOPY WITH PROPOFOL (N/A)  Patient Location: PACU  Anesthesia Type:General  Level of Consciousness: sedated  Airway & Oxygen Therapy: Patient Spontanous Breathing and Patient connected to nasal cannula oxygen  Post-op Assessment: Report given to RN and Post -op Vital signs reviewed and stable  Post vital signs: Reviewed and stable  Last Vitals:  Vitals:   09/07/16 0717 09/07/16 0937  BP: (!) 128/58 (!) 107/56  Pulse: 92 79  Resp: 20 18  Temp: 37.1 C (!) 36.2 C    Last Pain:  Vitals:   09/07/16 0937  TempSrc: Tympanic         Complications: No apparent anesthesia complications

## 2016-09-07 NOTE — Anesthesia Postprocedure Evaluation (Signed)
Anesthesia Post Note  Patient: Krystal Goodwin  Procedure(s) Performed: Procedure(s) (LRB): COLONOSCOPY WITH PROPOFOL (N/A)  Patient location during evaluation: Endoscopy Anesthesia Type: General Level of consciousness: awake and alert Pain management: pain level controlled Vital Signs Assessment: post-procedure vital signs reviewed and stable Respiratory status: spontaneous breathing, nonlabored ventilation, respiratory function stable and patient connected to nasal cannula oxygen Cardiovascular status: blood pressure returned to baseline and stable Postop Assessment: no signs of nausea or vomiting Anesthetic complications: no     Last Vitals:  Vitals:   09/07/16 1007 09/07/16 1017  BP: (!) 118/58 (!) 103/55  Pulse: 79 77  Resp: 19 20  Temp:      Last Pain:  Vitals:   09/07/16 0937  TempSrc: Tympanic                 Anias Bartol S

## 2016-09-07 NOTE — Anesthesia Preprocedure Evaluation (Addendum)
Anesthesia Evaluation  Patient identified by MRN, date of birth, ID band Patient awake    Reviewed: Allergy & Precautions, NPO status , Patient's Chart, lab work & pertinent test results, reviewed documented beta blocker date and time   Airway Mallampati: III  TM Distance: >3 FB     Dental  (+) Chipped, Partial Lower, Missing   Pulmonary shortness of breath, asthma , sleep apnea and Continuous Positive Airway Pressure Ventilation , former smoker,           Cardiovascular hypertension, Pt. on medications + pacemaker      Neuro/Psych    GI/Hepatic   Endo/Other  diabetes, Type 2Hypothyroidism   Renal/GU      Musculoskeletal  (+) Arthritis ,   Abdominal   Peds  Hematology  (+) anemia ,   Anesthesia Other Findings Gout. AVR.  Reproductive/Obstetrics                            Anesthesia Physical Anesthesia Plan  ASA: III  Anesthesia Plan: General   Post-op Pain Management:    Induction: Intravenous  PONV Risk Score and Plan:   Airway Management Planned:   Additional Equipment:   Intra-op Plan:   Post-operative Plan:   Informed Consent: I have reviewed the patients History and Physical, chart, labs and discussed the procedure including the risks, benefits and alternatives for the proposed anesthesia with the patient or authorized representative who has indicated his/her understanding and acceptance.     Plan Discussed with: CRNA  Anesthesia Plan Comments:         Anesthesia Quick Evaluation

## 2016-09-07 NOTE — Op Note (Signed)
[facility] Gastroenterology Patient Name: Krystal Goodwin Procedure Date: 09/07/2016 8:38 AM MRN: [id number] Account #: [id number] Date of Birth: November 28, 1933 Admit Type: Outpatient Age: 83 Room: [facility] ENDO ROOM 3 Gender: Female Note Status: Finalized Procedure:            Colonoscopy Indications:          High risk colon cancer surveillance: Personal history                        of colonic polyps Providers:            Manya Silvas, MD Referring MD:         Caprice Renshaw MD (Referring MD) Medicines:            Propofol per Anesthesia Complications:        No immediate complications. Procedure:            Pre-Anesthesia Assessment:                       - After reviewing the risks and benefits, the patient                        was deemed in satisfactory condition to undergo the                        procedure.                       After obtaining informed consent, the colonoscope was                        passed under direct vision. Throughout the procedure,                        the patient's blood pressure, pulse, and oxygen                        saturations were monitored continuously. The                        Colonoscope was introduced through the anus and                        advanced to the the ileocolonic anastomosis. The                        colonoscopy was somewhat difficult due to a tortuous                        colon. The patient tolerated the procedure well. The                        quality of the bowel preparation was excellent. Findings:      A small polyp was found in the transverse colon. The polyp was sessile.       The polyp was removed with a hot snare. Resection and retrieval were       complete.      Two sessile polyps were found in the transverse colon. The polyps were       diminutive in size. These polyps were removed with a jumbo cold forceps.  Resection and retrieval were complete.      A diminutive polyp was  found in the descending colon. The polyp was       sessile. The polyp was removed with a cold snare. Resection and       retrieval were complete.      The exam was otherwise without abnormality. Impression:           - One small polyp in the transverse colon, removed with                        a hot snare. Resected and retrieved.                       - Two diminutive polyps in the transverse colon,                        removed with a jumbo cold forceps. Resected and                        retrieved.                       - One diminutive polyp in the descending colon, removed                        with a cold snare. Resected and retrieved.                       - The examination was otherwise normal. Recommendation:       - Await pathology results. Manya Silvas, MD 09/07/2016 9:36:35 AM This report has been signed electronically. Number of Addenda: 0 Note Initiated On: 09/07/2016 8:38 AM Scope Withdrawal Time: 0 hours 11 minutes 57 seconds  Total Procedure Duration: 0 hours 24 minutes 18 seconds       [facility]

## 2016-09-07 NOTE — Anesthesia Post-op Follow-up Note (Cosign Needed)
Anesthesia QCDR form completed.        

## 2016-09-08 ENCOUNTER — Encounter: Payer: Self-pay | Admitting: Unknown Physician Specialty

## 2016-09-08 LAB — SURGICAL PATHOLOGY

## 2016-10-25 DIAGNOSIS — M109 Gout, unspecified: Secondary | ICD-10-CM | POA: Insufficient documentation

## 2016-12-06 DIAGNOSIS — H353210 Exudative age-related macular degeneration, right eye, stage unspecified: Secondary | ICD-10-CM | POA: Insufficient documentation

## 2017-04-10 ENCOUNTER — Other Ambulatory Visit: Payer: Self-pay | Admitting: Family Medicine

## 2017-04-11 ENCOUNTER — Other Ambulatory Visit: Payer: Self-pay | Admitting: Family Medicine

## 2017-04-11 DIAGNOSIS — Z1231 Encounter for screening mammogram for malignant neoplasm of breast: Secondary | ICD-10-CM

## 2017-05-23 ENCOUNTER — Ambulatory Visit
Admission: RE | Admit: 2017-05-23 | Discharge: 2017-05-23 | Disposition: A | Discharge status: Home / Self Care | Payer: Medicare Other | Source: Ambulatory Visit | Attending: Family Medicine | Admitting: Family Medicine

## 2017-05-23 DIAGNOSIS — Z1231 Encounter for screening mammogram for malignant neoplasm of breast: Secondary | ICD-10-CM | POA: Diagnosis not present

## 2017-05-23 HISTORY — DX: Malignant neoplasm of colon, unspecified: C18.9

## 2017-06-26 DIAGNOSIS — K449 Diaphragmatic hernia without obstruction or gangrene: Secondary | ICD-10-CM | POA: Insufficient documentation

## 2017-07-26 DIAGNOSIS — R0789 Other chest pain: Secondary | ICD-10-CM | POA: Insufficient documentation

## 2017-10-06 ENCOUNTER — Other Ambulatory Visit: Payer: Self-pay | Admitting: Physical Medicine and Rehabilitation

## 2017-10-06 DIAGNOSIS — M5416 Radiculopathy, lumbar region: Secondary | ICD-10-CM

## 2017-10-07 ENCOUNTER — Emergency Department
Admission: EM | Admit: 2017-10-07 | Discharge: 2017-10-07 | Disposition: A | Discharge status: Home / Self Care | Payer: Medicare Other | Attending: Emergency Medicine | Admitting: Emergency Medicine

## 2017-10-07 ENCOUNTER — Other Ambulatory Visit: Payer: Self-pay

## 2017-10-07 DIAGNOSIS — J45909 Unspecified asthma, uncomplicated: Secondary | ICD-10-CM | POA: Insufficient documentation

## 2017-10-07 DIAGNOSIS — Z87891 Personal history of nicotine dependence: Secondary | ICD-10-CM | POA: Diagnosis not present

## 2017-10-07 DIAGNOSIS — R739 Hyperglycemia, unspecified: Secondary | ICD-10-CM

## 2017-10-07 DIAGNOSIS — Z85038 Personal history of other malignant neoplasm of large intestine: Secondary | ICD-10-CM | POA: Diagnosis not present

## 2017-10-07 DIAGNOSIS — E039 Hypothyroidism, unspecified: Secondary | ICD-10-CM | POA: Diagnosis not present

## 2017-10-07 DIAGNOSIS — Z950 Presence of cardiac pacemaker: Secondary | ICD-10-CM | POA: Diagnosis not present

## 2017-10-07 DIAGNOSIS — I10 Essential (primary) hypertension: Secondary | ICD-10-CM | POA: Diagnosis not present

## 2017-10-07 DIAGNOSIS — Z7984 Long term (current) use of oral hypoglycemic drugs: Secondary | ICD-10-CM | POA: Insufficient documentation

## 2017-10-07 DIAGNOSIS — Z952 Presence of prosthetic heart valve: Secondary | ICD-10-CM | POA: Insufficient documentation

## 2017-10-07 DIAGNOSIS — Z79899 Other long term (current) drug therapy: Secondary | ICD-10-CM | POA: Insufficient documentation

## 2017-10-07 DIAGNOSIS — E1165 Type 2 diabetes mellitus with hyperglycemia: Secondary | ICD-10-CM | POA: Diagnosis present

## 2017-10-07 DIAGNOSIS — Z7982 Long term (current) use of aspirin: Secondary | ICD-10-CM | POA: Diagnosis not present

## 2017-10-07 LAB — BASIC METABOLIC PANEL
ANION GAP: 11 (ref 5–15)
BUN: 42 mg/dL — ABNORMAL HIGH (ref 8–23)
CO2: 18 mmol/L — AB (ref 22–32)
Calcium: 9.1 mg/dL (ref 8.9–10.3)
Chloride: 106 mmol/L (ref 98–111)
Creatinine, Ser: 1.46 mg/dL — ABNORMAL HIGH (ref 0.44–1.00)
GFR calc non Af Amer: 32 mL/min — ABNORMAL LOW (ref >60)
GFR, EST AFRICAN AMERICAN: 37 mL/min — AB (ref >60)
GLUCOSE: 467 mg/dL — AB (ref 70–99)
Potassium: 4.6 mmol/L (ref 3.5–5.1)
Sodium: 135 mmol/L (ref 135–145)

## 2017-10-07 LAB — CBC
HCT: 39.5 % (ref 35.0–47.0)
HEMOGLOBIN: 13.2 g/dL (ref 12.0–16.0)
MCH: 29.7 pg (ref 26.0–34.0)
MCHC: 33.3 g/dL (ref 32.0–36.0)
MCV: 89 fL (ref 80.0–100.0)
Platelets: 207 10*3/uL (ref 150–440)
RBC: 4.44 MIL/uL (ref 3.80–5.20)
RDW: 14.8 % — ABNORMAL HIGH (ref 11.5–14.5)
WBC: 7.4 10*3/uL (ref 3.6–11.0)

## 2017-10-07 LAB — URINALYSIS, COMPLETE (UACMP) WITH MICROSCOPIC
BACTERIA UA: NONE SEEN
Bilirubin Urine: NEGATIVE
Glucose, UA: >=500 mg/dL — AB
Hgb urine dipstick: NEGATIVE
KETONES UR: NEGATIVE mg/dL
Nitrite: NEGATIVE
PROTEIN: NEGATIVE mg/dL
Specific Gravity, Urine: 1.011 (ref 1.005–1.030)
pH: 5 (ref 5.0–8.0)

## 2017-10-07 LAB — GLUCOSE, CAPILLARY
GLUCOSE-CAPILLARY: 228 mg/dL — AB (ref 70–99)
Glucose-Capillary: 414 mg/dL — ABNORMAL HIGH (ref 70–99)

## 2017-10-07 MED ORDER — INSULIN ASPART 100 UNIT/ML ~~LOC~~ SOLN
6.0000 [IU] | Freq: Once | SUBCUTANEOUS | Status: AC
  Administered 2017-10-07: 6 [IU] via INTRAVENOUS
  Filled 2017-10-07: qty 1

## 2017-10-07 MED ORDER — SODIUM CHLORIDE 0.9 % IV BOLUS
1000.0000 mL | Freq: Once | INTRAVENOUS | Status: AC
  Administered 2017-10-07: 1000 mL via INTRAVENOUS

## 2017-10-07 NOTE — ED Provider Notes (Addendum)
[facility] Emergency Department Provider Note  ____________________________________________  Time seen: Approximately 6:46 PM  I have reviewed the triage vital signs and the nursing notes.   HISTORY  Chief Complaint Hyperglycemia    HPI LOWELL MCGURK is a 84 y.o. female with a history of aortic stenosis colon cancer diabetes who comes today for evaluation of her blood sugar being 500.  Denies any specific symptoms but has been having increased thirst today and increased urine output.  Usually her blood sugar ranges from about 70-1 10 on her oral hypoglycemic medicines, but yesterday she saw orthopedics for her bilateral hip pain and they prescribed her a steroid taper.  This morning she took 60 mg of prednisone and has felt jittery since and watched her blood sugars go up on repeat assessments.  Denies any acute pain anywhere.  Denies dizziness fevers chills or shortness of breath.      Past Medical History:  Diagnosis Date  . Anemia    iron def.anemia  . Aortic stenosis, severe   . Asthma   . Cancer ([place])    colon  . Chest pain   . Colon cancer ([place])   . Diabetes mellitus without complication ([place])   . Dyslipidemia   . Dyspnea   . Gout, joint   . H/O postmenopausal osteoporosis   . Hematuria   . History of palpitations   . Hx of colonic polyps   . Hypertension   . Hypothyroidism   . IBS (irritable bowel syndrome)   . Schatzki's ring   . Sleep apnea      There are no active problems to display for this patient.    Past Surgical History:  Procedure Laterality Date  . AORTIC VALVE REPLACEMENT    . CARDIAC PACEMAKER PLACEMENT    . CHOLECYSTECTOMY    . COLON SURGERY  1997   RIGHT HEMICOLECTOMY  . COLONOSCOPY    . COLONOSCOPY WITH PROPOFOL N/A 09/07/2016   Procedure: COLONOSCOPY WITH PROPOFOL;  Surgeon: Manya Silvas, MD;  Location: [facility] ENDOSCOPY;  Service: Endoscopy;  Laterality: N/A;  . ESOPHAGOGASTRODUODENOSCOPY    . TEE WITHOUT  CARDIOVERSION  03/08/2013     Prior to Admission medications   Medication Sig Start Date End Date Taking? Authorizing Provider  ALPRAZolam Duanne Moron) 0.5 MG tablet Take 0.5 mg by mouth at bedtime as needed for anxiety.    [provider]  amLODipine (NORVASC) 10 MG tablet Take 10 mg by mouth daily.    [provider]  aspirin EC 81 MG tablet Take 81 mg by mouth daily.    [provider]  budesonide-formoterol (SYMBICORT) 160-4.5 MCG/ACT inhaler Inhale 2 puffs into the lungs 2 (two) times daily.    [provider]  ferrous sulfate 325 (65 FE) MG tablet Take 325 mg by mouth daily with breakfast.    [provider]  furosemide (LASIX) 40 MG tablet Take 40 mg by mouth.    [provider]  glipiZIDE (GLUCOTROL XL) 10 MG 24 hr tablet Take 10 mg by mouth daily with breakfast.    [provider]  levothyroxine (SYNTHROID, LEVOTHROID) 88 MCG tablet Take 88 mcg by mouth daily before breakfast.    [provider]  losartan (COZAAR) 100 MG tablet Take 100 mg by mouth daily.    [provider]  Melatonin-Pyridoxine 1-10 MG TABS Take by mouth at bedtime as needed.    [provider]  Multiple Vitamin (MULTIVITAMIN) tablet Take 1 tablet by mouth  daily.    [provider]  pantoprazole (PROTONIX) 40 MG tablet Take 40 mg by mouth daily.    [provider]  PARoxetine (PAXIL) 20 MG tablet Take 20 mg by mouth daily.    [provider]  traMADol (ULTRAM) 50 MG tablet Take 50 mg by mouth every 6 (six) hours as needed.    [provider]     Allergies Amoxicillin; Erythromycin; and Vicodin [hydrocodone-acetaminophen]   Family History  Problem Relation Age of Onset  . Breast cancer Neg Hx     Social History Social History   Tobacco Use  . Smoking status: Former Smoker    Packs/day: 1.00    Years: 20.00    Pack years: 20.00    Types: Cigarettes    Last attempt to quit: 02/21/1989     Years since quitting: 28.6  . Smokeless tobacco: Never Used  Substance Use Topics  . Alcohol use: No  . Drug use: No    Review of Systems  Constitutional:   No fever or chills.  ENT:   No sore throat. No rhinorrhea. Cardiovascular:   No chest pain or syncope. Respiratory:   No dyspnea or cough. Gastrointestinal:   Negative for abdominal pain, vomiting and diarrhea.  Musculoskeletal:   Chronic bilateral hip pain All other systems reviewed and are negative except as documented above in ROS and HPI.  ____________________________________________   PHYSICAL EXAM:  VITAL SIGNS: ED Triage Vitals [10/07/17 1530]  Enc Vitals Group     BP (!) 156/71     Pulse Rate 87     Resp 16     Temp 98.3 F (36.8 C)     Temp Source Oral     SpO2 95 %     Weight 200 lb (90.7 kg)     Height 5\' 6"  (1.676 m)     Head Circumference      Peak Flow      Pain Score 0     Pain Loc      Pain Edu?      Excl. in [place]?     Vital signs reviewed, nursing assessments reviewed.   Constitutional:   Alert and oriented. Non-toxic appearance. Eyes:   Conjunctivae are normal. EOMI. PERRL. ENT      Head:   Normocephalic and atraumatic.      Nose:   No congestion/rhinnorhea.       Mouth/Throat:   MMM, no pharyngeal erythema. No peritonsillar mass.       Neck:   No meningismus. Full ROM. Hematological/Lymphatic/Immunilogical:   No cervical lymphadenopathy. Cardiovascular:   RRR. Symmetric bilateral radial and DP pulses.  No murmurs. Cap refill less than 2 seconds. Respiratory:   Normal respiratory effort without tachypnea/retractions. Breath sounds are clear and equal bilaterally. No wheezes/rales/rhonchi. Gastrointestinal:   Soft and nontender. Non distended. There is no CVA tenderness.  No rebound, rigidity, or guarding.  Musculoskeletal:   Normal range of motion in all extremities. No joint effusions.  No lower extremity tenderness.  No edema. Neurologic:   Normal speech and language.  Motor  grossly intact. No acute focal neurologic deficits are appreciated.  Skin:    Skin is warm, dry and intact. No rash noted.  No petechiae, purpura, or bullae.  ____________________________________________    LABS (pertinent positives/negatives) (all labs ordered are listed, but only abnormal results are displayed) Labs Reviewed  GLUCOSE, CAPILLARY - Abnormal; Notable for the following components:      Result Value  Glucose-Capillary 414 (*)    All other components within normal limits  BASIC METABOLIC PANEL - Abnormal; Notable for the following components:   CO2 18 (*)    Glucose, Bld 467 (*)    BUN 42 (*)    Creatinine, Ser 1.46 (*)    GFR calc non Af Amer 32 (*)    GFR calc Af Amer 37 (*)    All other components within normal limits  CBC - Abnormal; Notable for the following components:   RDW 14.8 (*)    All other components within normal limits  URINALYSIS, COMPLETE (UACMP) WITH MICROSCOPIC - Abnormal; Notable for the following components:   Color, Urine YELLOW (*)    APPearance HAZY (*)    Glucose, UA >=500 (*)    Leukocytes, UA SMALL (*)    All other components within normal limits  CBG MONITORING, ED   ____________________________________________   EKG    ____________________________________________    RADIOLOGY  No results found.  ____________________________________________   PROCEDURES Procedures  ____________________________________________    CLINICAL IMPRESSION / ASSESSMENT AND PLAN / ED COURSE  Pertinent labs & imaging results that were available during my care of the patient were reviewed by me and considered in my medical decision making (see chart for details).    Patient comes to the ED for evaluation of her blood sugar being 500 at home.  She is having jitteriness which is likely secondary to the prednisone.  I think this is also causing the hyperglycemia which is resulted in some increased thirst and urine output today.  She also takes  Lasix every day.  Advised her to hold the Lasix for 24 hours and stop the prednisone as she is going to have significant diuresis from the hyperglycemia.    Labs here confirm a blood sugar of 500.  No ketosis or acidosis.  Chemistry is overall unremarkable and not worrisome for DKA at this time.  No evidence of infection.  Patient given a liter of saline and 6 units of IV insulin given that she is insulin nave.  She is very well-appearing calm and comfortable, exam is reassuring and benign I think she is suitable for outpatient follow-up with the instructions to stop the prednisone and follow-up with her doctor this week.  Return precautions discussed for any worsening symptoms.  We will recheck her blood sugar to ensure that it is improved with a goal being less than 300 for now.      ____________________________________________   FINAL CLINICAL IMPRESSION(S) / ED DIAGNOSES    Final diagnoses:  Hyperglycemia     ED Discharge Orders    None      Portions of this note were generated with dragon dictation software. Dictation errors may occur despite best attempts at proofreading.    Carrie Mew, MD 10/07/17 [place], MD 10/07/17 [phone]

## 2017-10-07 NOTE — ED Triage Notes (Signed)
Pt arrives to ED alert, oriented, ambulatory. Taking prednisone for her back, started this AM (taper pack. Took 6 this AM). Type 2 DM. CBG at home 511.

## 2017-10-07 NOTE — Discharge Instructions (Signed)
As we discussed, stop taking the prednisone since it is causing problems with your glucose control.  Resume taking your lasix tomorrow, and follow up with your doctor to continue monitoring your symptoms. It will be helpful to weigh yourself every day to track your fluid balance during the time.

## 2017-10-12 ENCOUNTER — Ambulatory Visit
Admission: RE | Admit: 2017-10-12 | Discharge: 2017-10-12 | Disposition: A | Discharge status: Home / Self Care | Payer: Medicare Other | Source: Ambulatory Visit | Attending: Physical Medicine and Rehabilitation | Admitting: Physical Medicine and Rehabilitation

## 2017-10-12 DIAGNOSIS — I700 Atherosclerosis of aorta: Secondary | ICD-10-CM | POA: Diagnosis not present

## 2017-10-12 DIAGNOSIS — M48061 Spinal stenosis, lumbar region without neurogenic claudication: Secondary | ICD-10-CM | POA: Diagnosis not present

## 2017-10-12 DIAGNOSIS — M5416 Radiculopathy, lumbar region: Secondary | ICD-10-CM

## 2017-10-12 DIAGNOSIS — M4807 Spinal stenosis, lumbosacral region: Secondary | ICD-10-CM | POA: Insufficient documentation

## 2018-04-13 ENCOUNTER — Other Ambulatory Visit: Payer: Self-pay | Admitting: Family Medicine

## 2018-04-13 DIAGNOSIS — Z1231 Encounter for screening mammogram for malignant neoplasm of breast: Secondary | ICD-10-CM

## 2018-06-28 ENCOUNTER — Other Ambulatory Visit: Payer: Self-pay

## 2018-06-28 ENCOUNTER — Emergency Department
Admission: EM | Admit: 2018-06-28 | Discharge: 2018-06-28 | Disposition: A | Discharge status: Home / Self Care | Payer: Medicare Other | Attending: Emergency Medicine | Admitting: Emergency Medicine

## 2018-06-28 ENCOUNTER — Encounter: Payer: Self-pay | Admitting: Emergency Medicine

## 2018-06-28 DIAGNOSIS — Z5321 Procedure and treatment not carried out due to patient leaving prior to being seen by health care provider: Secondary | ICD-10-CM | POA: Insufficient documentation

## 2018-06-28 DIAGNOSIS — R739 Hyperglycemia, unspecified: Secondary | ICD-10-CM | POA: Diagnosis not present

## 2018-06-28 LAB — COMPREHENSIVE METABOLIC PANEL
ALT: 23 U/L (ref 0–44)
AST: 30 U/L (ref 15–41)
Albumin: 4 g/dL (ref 3.5–5.0)
Alkaline Phosphatase: 94 U/L (ref 38–126)
Anion gap: 13 (ref 5–15)
BUN: 43 mg/dL — ABNORMAL HIGH (ref 8–23)
CO2: 17 mmol/L — ABNORMAL LOW (ref 22–32)
Calcium: 9.4 mg/dL (ref 8.9–10.3)
Chloride: 107 mmol/L (ref 98–111)
Creatinine, Ser: 1.32 mg/dL — ABNORMAL HIGH (ref 0.44–1.00)
GFR calc Af Amer: 43 mL/min — ABNORMAL LOW (ref >60)
GFR calc non Af Amer: 37 mL/min — ABNORMAL LOW (ref >60)
Glucose, Bld: 291 mg/dL — ABNORMAL HIGH (ref 70–99)
Potassium: 4.4 mmol/L (ref 3.5–5.1)
Sodium: 137 mmol/L (ref 135–145)
Total Bilirubin: 0.8 mg/dL (ref 0.3–1.2)
Total Protein: 7.9 g/dL (ref 6.5–8.1)

## 2018-06-28 LAB — CBC WITH DIFFERENTIAL/PLATELET
Abs Immature Granulocytes: 0.1 10*3/uL — ABNORMAL HIGH (ref 0.00–0.07)
Basophils Absolute: 0 10*3/uL (ref 0.0–0.1)
Basophils Relative: 0 %
Eosinophils Absolute: 0 10*3/uL (ref 0.0–0.5)
Eosinophils Relative: 0 %
HCT: 41.5 % (ref 36.0–46.0)
Hemoglobin: 13.6 g/dL (ref 12.0–15.0)
Immature Granulocytes: 2 %
Lymphocytes Relative: 13 %
Lymphs Abs: 0.9 10*3/uL (ref 0.7–4.0)
MCH: 28.7 pg (ref 26.0–34.0)
MCHC: 32.8 g/dL (ref 30.0–36.0)
MCV: 87.6 fL (ref 80.0–100.0)
Monocytes Absolute: 0.1 10*3/uL (ref 0.1–1.0)
Monocytes Relative: 1 %
Neutro Abs: 5.7 10*3/uL (ref 1.7–7.7)
Neutrophils Relative %: 84 %
Platelets: 234 10*3/uL (ref 150–400)
RBC: 4.74 MIL/uL (ref 3.87–5.11)
RDW: 14.5 % (ref 11.5–15.5)
WBC: 6.8 10*3/uL (ref 4.0–10.5)
nRBC: 0 % (ref 0.0–0.2)

## 2018-06-28 LAB — GLUCOSE, CAPILLARY: Glucose-Capillary: 265 mg/dL — ABNORMAL HIGH (ref 70–99)

## 2018-06-28 NOTE — ED Notes (Signed)
Pt reports feeling much better, denies any c/o and going home now; instr to return for any new or worsening symptoms

## 2018-06-28 NOTE — ED Triage Notes (Signed)
Patient ambulatory to triage with steady gait, without difficulty or distress noted, mask in place; pt reports that she received a steroid injection today in her right hip; c/o elevated FSBS (315 at home); denies any accomp symptoms

## 2018-07-11 ENCOUNTER — Other Ambulatory Visit: Payer: Self-pay

## 2018-07-11 ENCOUNTER — Ambulatory Visit
Admission: RE | Admit: 2018-07-11 | Discharge: 2018-07-11 | Disposition: A | Discharge status: Home / Self Care | Payer: Medicare Other | Source: Ambulatory Visit | Attending: Family Medicine | Admitting: Family Medicine

## 2018-07-11 DIAGNOSIS — Z1231 Encounter for screening mammogram for malignant neoplasm of breast: Secondary | ICD-10-CM | POA: Insufficient documentation

## 2018-11-05 ENCOUNTER — Emergency Department: Payer: Medicare Other

## 2018-11-05 ENCOUNTER — Other Ambulatory Visit: Payer: Self-pay

## 2018-11-05 ENCOUNTER — Emergency Department
Admission: EM | Admit: 2018-11-05 | Discharge: 2018-11-05 | Disposition: A | Discharge status: Home / Self Care | Payer: Medicare Other | Attending: Student | Admitting: Student

## 2018-11-05 ENCOUNTER — Encounter: Payer: Self-pay | Admitting: Emergency Medicine

## 2018-11-05 DIAGNOSIS — Z7984 Long term (current) use of oral hypoglycemic drugs: Secondary | ICD-10-CM | POA: Insufficient documentation

## 2018-11-05 DIAGNOSIS — Z952 Presence of prosthetic heart valve: Secondary | ICD-10-CM | POA: Insufficient documentation

## 2018-11-05 DIAGNOSIS — M25511 Pain in right shoulder: Secondary | ICD-10-CM | POA: Diagnosis not present

## 2018-11-05 DIAGNOSIS — E119 Type 2 diabetes mellitus without complications: Secondary | ICD-10-CM | POA: Diagnosis not present

## 2018-11-05 DIAGNOSIS — Z87891 Personal history of nicotine dependence: Secondary | ICD-10-CM | POA: Insufficient documentation

## 2018-11-05 DIAGNOSIS — W19XXXA Unspecified fall, initial encounter: Secondary | ICD-10-CM

## 2018-11-05 DIAGNOSIS — E039 Hypothyroidism, unspecified: Secondary | ICD-10-CM | POA: Insufficient documentation

## 2018-11-05 DIAGNOSIS — J45909 Unspecified asthma, uncomplicated: Secondary | ICD-10-CM | POA: Diagnosis not present

## 2018-11-05 DIAGNOSIS — Z950 Presence of cardiac pacemaker: Secondary | ICD-10-CM | POA: Insufficient documentation

## 2018-11-05 DIAGNOSIS — I10 Essential (primary) hypertension: Secondary | ICD-10-CM | POA: Diagnosis not present

## 2018-11-05 DIAGNOSIS — R51 Headache: Secondary | ICD-10-CM | POA: Insufficient documentation

## 2018-11-05 DIAGNOSIS — Z7982 Long term (current) use of aspirin: Secondary | ICD-10-CM | POA: Insufficient documentation

## 2018-11-05 DIAGNOSIS — Z79899 Other long term (current) drug therapy: Secondary | ICD-10-CM | POA: Diagnosis not present

## 2018-11-05 LAB — GLUCOSE, CAPILLARY: Glucose-Capillary: 137 mg/dL — ABNORMAL HIGH (ref 70–99)

## 2018-11-05 MED ORDER — LIDOCAINE 5 % EX PTCH: 1.0000 | MEDICATED_PATCH | Freq: Every day | CUTANEOUS | 0 refills | Status: AC

## 2018-11-05 MED ORDER — LIDOCAINE 5 % EX PTCH
1.0000 | MEDICATED_PATCH | CUTANEOUS | Status: DC
  Administered 2018-11-05: 18:00:00 1 via TRANSDERMAL
  Filled 2018-11-05: qty 1

## 2018-11-05 NOTE — ED Triage Notes (Addendum)
States approx 1 hour ago got foot caught on piece of furniture and fell. Pain R shoulder. States takes Eloquis due to pacemaker but has no head pain.

## 2018-11-05 NOTE — Discharge Instructions (Signed)
Your exam, XR, and CT scans are essentially normal following your fall. There is no evidence of any shoulder fracture or dislocation. You also have no evidence of a serious head injury or bleeding. Take your home meds as directed. Use the arm sling and the Lidoderm Patches as directed. Follow-up with your provider for ongoing symptoms.

## 2018-11-05 NOTE — ED Notes (Signed)
CBG 137 

## 2018-11-05 NOTE — ED Notes (Addendum)
See triage note  Presents with pain to right shoulder  States she fell   Hitting her shoulder  Also having pain to right knee and pain to ear

## 2018-11-05 NOTE — ED Provider Notes (Signed)
[facility] Emergency Department Provider Note ____________________________________________  Time seen: 1650  I have reviewed the triage vital signs and the nursing notes.  HISTORY  Chief Complaint  Shoulder Pain  HPI Krystal Goodwin is a 85 y.o. female presents to the ED for evaluation following a mechanical fall at home.  Patient describes about an hour ago she tripped on a piece of furniture after her foot got caught, and fell to the right side.  She describes right shoulder pain following the fall.  She denies any head pain   Past Medical History:  Diagnosis Date  . Anemia    iron def.anemia  . Aortic stenosis, severe   . Asthma   . Cancer ([place])    colon  . Chest pain   . Colon cancer ([place])   . Diabetes mellitus without complication ([place])   . Dyslipidemia   . Dyspnea   . Gout, joint   . H/O postmenopausal osteoporosis   . Hematuria   . History of palpitations   . Hx of colonic polyps   . Hypertension   . Hypothyroidism   . IBS (irritable bowel syndrome)   . Schatzki's ring   . Sleep apnea     There are no active problems to display for this patient.   Past Surgical History:  Procedure Laterality Date  . AORTIC VALVE REPLACEMENT    . CARDIAC PACEMAKER PLACEMENT    . CHOLECYSTECTOMY    . COLON SURGERY  1997   RIGHT HEMICOLECTOMY  . COLONOSCOPY    . COLONOSCOPY WITH PROPOFOL N/A 09/07/2016   Procedure: COLONOSCOPY WITH PROPOFOL;  Surgeon: Manya Silvas, MD;  Location: [facility] ENDOSCOPY;  Service: Endoscopy;  Laterality: N/A;  . ESOPHAGOGASTRODUODENOSCOPY    . TEE WITHOUT CARDIOVERSION  03/08/2013    Prior to Admission medications   Medication Sig Start Date End Date Taking? Authorizing Provider  ALPRAZolam Duanne Moron) 0.5 MG tablet Take 0.5 mg by mouth at bedtime as needed for anxiety.    [provider]  amLODipine (NORVASC) 10 MG tablet Take 10 mg by mouth daily.    [provider]  aspirin EC 81 MG tablet Take 81 mg by  mouth daily.    [provider]  budesonide-formoterol (SYMBICORT) 160-4.5 MCG/ACT inhaler Inhale 2 puffs into the lungs 2 (two) times daily.    [provider]  ferrous sulfate 325 (65 FE) MG tablet Take 325 mg by mouth daily with breakfast.    [provider]  furosemide (LASIX) 40 MG tablet Take 40 mg by mouth.    [provider]  glipiZIDE (GLUCOTROL XL) 10 MG 24 hr tablet Take 10 mg by mouth daily with breakfast.    [provider]  levothyroxine (SYNTHROID, LEVOTHROID) 88 MCG tablet Take 88 mcg by mouth daily before breakfast.    [provider]  lidocaine (LIDODERM) 5 % Place 1 patch onto the skin daily for 5 days. Remove & Discard patch after 12 hours of wear each day. 11/05/18 11/10/18  Asher Babilonia, Dannielle Karvonen, PA-C  losartan (COZAAR) 100 MG tablet Take 100 mg by mouth daily.    [provider]  Melatonin-Pyridoxine 1-10 MG TABS Take by mouth at bedtime as needed.    [provider]  Multiple Vitamin (MULTIVITAMIN) tablet Take 1 tablet by mouth daily.    [provider]  pantoprazole (PROTONIX) 40 MG tablet Take 40 mg by mouth daily.    [provider]  PARoxetine (PAXIL) 20 MG tablet  Take 20 mg by mouth daily.    [provider]  traMADol (ULTRAM) 50 MG tablet Take 50 mg by mouth every 6 (six) hours as needed.    [provider]    Allergies Amoxicillin, Erythromycin, and Vicodin [hydrocodone-acetaminophen]  Family History  Problem Relation Age of Onset  . Breast cancer Neg Hx     Social History Social History   Tobacco Use  . Smoking status: Former Smoker    Packs/day: 1.00    Years: 20.00    Pack years: 20.00    Types: Cigarettes    Quit date: 02/21/1989    Years since quitting: 29.7  . Smokeless tobacco: Never Used  Substance Use Topics  . Alcohol use: No  . Drug use: No    Review of Systems  Constitutional: Negative for fever. Eyes: Negative for visual  changes. ENT: Negative for sore throat. Cardiovascular: Negative for chest pain. Respiratory: Negative for shortness of breath. Gastrointestinal: Negative for abdominal pain, vomiting and diarrhea. Genitourinary: Negative for dysuria. Musculoskeletal: Negative for back pain. Right shoulder pain as above Skin: Negative for rash. Neurological: Negative for headaches, focal weakness or numbness. ____________________________________________  PHYSICAL EXAM:  VITAL SIGNS: ED Triage Vitals  Enc Vitals Group     BP 11/05/18 1557 (!) 120/44     Pulse Rate 11/05/18 1557 81     Resp 11/05/18 1557 18     Temp 11/05/18 1557 98 F (36.7 C)     Temp Source 11/05/18 1557 Oral     SpO2 11/05/18 1557 97 %     Weight 11/05/18 1559 200 lb (90.7 kg)     Height 11/05/18 1559 5\' 6"  (1.676 m)     Head Circumference --      Peak Flow --      Pain Score --      Pain Loc --      Pain Edu? --      Excl. in [place]? --     Constitutional: Alert and oriented. Well appearing and in no distress. GCS =15 Head: Normocephalic and atraumatic. No Battle's Sign Eyes: Conjunctivae are normal. PERRL. Normal extraocular movements Ears: Canals clear. TMs intact bilaterally. Superficial wound to the left pinna Nose: No congestion/rhinorrhea/epistaxis. Neck: Supple. No midline tenderness.  Cardiovascular: Normal rate, regular rhythm. Normal distal pulses. Respiratory: Normal respiratory effort. No wheezes/rales/rhonchi. Gastrointestinal: Soft and nontender. No distention. Musculoskeletal: Right shoulder without deformity or dislocation or sulcus sign. Normal ROM without crepitus. Right knee without deformity. Nontender with normal range of motion in all extremities.  Neurologic:  Normal gait without ataxia. Normal speech and language. No gross focal neurologic deficits are appreciated. Skin:  Skin is warm, dry and intact. No rash noted. ____________________________________________   LABORATORY  Labs Reviewed   GLUCOSE, CAPILLARY - Abnormal; Notable for the following components:      Result Value   Glucose-Capillary 137 (*)    All other components within normal limits  CBG MONITORING, ED  ____________________________________________  RADIOLOGY  DG Right Shoulder Negative  CT Head w/o CM Negative  I, Britlee Skolnik V Bacon-Natalia Wittmeyer, personally viewed and evaluated these images (plain radiographs) as part of my medical decision making, as well as reviewing the written report by the radiologist. ____________________________________________  PROCEDURES  Lidoderm patch Arm sling Procedures ____________________________________________  INITIAL IMPRESSION / ASSESSMENT AND PLAN / ED COURSE  Geriatric patient with ED evaluation of injury sustained following mechanical fall.  Patient's clinical features overall benign reassuring at this time.  No signs of any  acute neuromuscular deficit or close head injury.  CT imaging of the head screen by me does not indicate any acute intracranial process or subdural hemorrhage.  X-ray of the shoulder is also negative and reassuring at this time.  Patient was placed in arm sling for support and is given Lidoderm patches to the shoulder and knee for pain relief.  She will follow with primary provider for ongoing symptom management.  Return precautions have been reviewed.  Krystal Goodwin was evaluated in Emergency Department on 11/05/2018 for the symptoms described in the history of present illness. She was evaluated in the context of the global COVID-19 pandemic, which necessitated consideration that the patient might be at risk for infection with the SARS-CoV-2 virus that causes COVID-19. Institutional protocols and algorithms that pertain to the evaluation of patients at risk for COVID-19 are in a state of rapid change based on information released by regulatory bodies including the CDC and federal and state organizations. These policies and algorithms were followed during  the patient's care in the ED. ____________________________________________  FINAL CLINICAL IMPRESSION(S) / ED DIAGNOSES  Final diagnoses:  Fall, initial encounter  Acute pain of right shoulder      Edia Pursifull, Dannielle Karvonen, PA-C 11/05/18 1926    Lilia Pro., MD 11/05/18 2222

## 2019-05-01 ENCOUNTER — Other Ambulatory Visit: Payer: Self-pay | Admitting: Otolaryngology

## 2019-05-01 DIAGNOSIS — K118 Other diseases of salivary glands: Secondary | ICD-10-CM

## 2019-05-13 ENCOUNTER — Ambulatory Visit
Admission: RE | Admit: 2019-05-13 | Discharge: 2019-05-13 | Disposition: A | Discharge status: Home / Self Care | Payer: Medicare PPO | Source: Ambulatory Visit | Attending: Otolaryngology | Admitting: Otolaryngology

## 2019-05-13 ENCOUNTER — Other Ambulatory Visit: Payer: Self-pay

## 2019-05-13 ENCOUNTER — Other Ambulatory Visit: Payer: Medicare Other

## 2019-05-13 DIAGNOSIS — K118 Other diseases of salivary glands: Secondary | ICD-10-CM | POA: Diagnosis present

## 2019-05-13 MED ORDER — IOHEXOL 300 MG/ML  SOLN
60.0000 mL | Freq: Once | INTRAMUSCULAR | Status: AC | PRN
  Administered 2019-05-13: 14:00:00 60 mL via INTRAVENOUS

## 2019-05-27 DIAGNOSIS — Z6841 Body Mass Index (BMI) 40.0 and over, adult: Secondary | ICD-10-CM | POA: Insufficient documentation

## 2019-05-31 ENCOUNTER — Other Ambulatory Visit: Payer: Self-pay | Admitting: Otolaryngology

## 2019-05-31 DIAGNOSIS — R42 Dizziness and giddiness: Secondary | ICD-10-CM

## 2019-06-07 ENCOUNTER — Ambulatory Visit
Admission: RE | Admit: 2019-06-07 | Discharge: 2019-06-07 | Disposition: A | Discharge status: Home / Self Care | Payer: Medicare PPO | Source: Ambulatory Visit | Attending: Otolaryngology | Admitting: Otolaryngology

## 2019-06-07 ENCOUNTER — Other Ambulatory Visit: Payer: Self-pay

## 2019-06-07 DIAGNOSIS — R42 Dizziness and giddiness: Secondary | ICD-10-CM

## 2019-09-04 ENCOUNTER — Other Ambulatory Visit: Payer: Self-pay

## 2019-09-04 ENCOUNTER — Inpatient Hospital Stay
Admission: EM | Admit: 2019-09-04 | Discharge: 2019-09-10 | DRG: 522 | Disposition: A | Discharge status: Skilled Nursing Facility | Payer: Medicare PPO | Attending: Internal Medicine | Admitting: Internal Medicine

## 2019-09-04 ENCOUNTER — Emergency Department: Payer: Medicare PPO

## 2019-09-04 ENCOUNTER — Encounter: Payer: Self-pay | Admitting: *Deleted

## 2019-09-04 DIAGNOSIS — Z96649 Presence of unspecified artificial hip joint: Secondary | ICD-10-CM

## 2019-09-04 DIAGNOSIS — S72012A Unspecified intracapsular fracture of left femur, initial encounter for closed fracture: Secondary | ICD-10-CM | POA: Diagnosis not present

## 2019-09-04 DIAGNOSIS — Z7984 Long term (current) use of oral hypoglycemic drugs: Secondary | ICD-10-CM

## 2019-09-04 DIAGNOSIS — Z952 Presence of prosthetic heart valve: Secondary | ICD-10-CM

## 2019-09-04 DIAGNOSIS — Z79890 Hormone replacement therapy (postmenopausal): Secondary | ICD-10-CM

## 2019-09-04 DIAGNOSIS — G8929 Other chronic pain: Secondary | ICD-10-CM | POA: Diagnosis present

## 2019-09-04 DIAGNOSIS — I129 Hypertensive chronic kidney disease with stage 1 through stage 4 chronic kidney disease, or unspecified chronic kidney disease: Secondary | ICD-10-CM | POA: Diagnosis present

## 2019-09-04 DIAGNOSIS — S72002A Fracture of unspecified part of neck of left femur, initial encounter for closed fracture: Secondary | ICD-10-CM

## 2019-09-04 DIAGNOSIS — J45909 Unspecified asthma, uncomplicated: Secondary | ICD-10-CM | POA: Diagnosis present

## 2019-09-04 DIAGNOSIS — W010XXA Fall on same level from slipping, tripping and stumbling without subsequent striking against object, initial encounter: Secondary | ICD-10-CM | POA: Diagnosis present

## 2019-09-04 DIAGNOSIS — Z85038 Personal history of other malignant neoplasm of large intestine: Secondary | ICD-10-CM

## 2019-09-04 DIAGNOSIS — Y92000 Kitchen of unspecified non-institutional (private) residence as  the place of occurrence of the external cause: Secondary | ICD-10-CM

## 2019-09-04 DIAGNOSIS — W19XXXA Unspecified fall, initial encounter: Secondary | ICD-10-CM

## 2019-09-04 DIAGNOSIS — Z9049 Acquired absence of other specified parts of digestive tract: Secondary | ICD-10-CM

## 2019-09-04 DIAGNOSIS — E669 Obesity, unspecified: Secondary | ICD-10-CM | POA: Diagnosis present

## 2019-09-04 DIAGNOSIS — N1832 Chronic kidney disease, stage 3b: Secondary | ICD-10-CM | POA: Diagnosis not present

## 2019-09-04 DIAGNOSIS — Z87891 Personal history of nicotine dependence: Secondary | ICD-10-CM

## 2019-09-04 DIAGNOSIS — Z20822 Contact with and (suspected) exposure to covid-19: Secondary | ICD-10-CM | POA: Diagnosis present

## 2019-09-04 DIAGNOSIS — Z6833 Body mass index (BMI) 33.0-33.9, adult: Secondary | ICD-10-CM

## 2019-09-04 DIAGNOSIS — E1122 Type 2 diabetes mellitus with diabetic chronic kidney disease: Secondary | ICD-10-CM | POA: Diagnosis present

## 2019-09-04 DIAGNOSIS — I10 Essential (primary) hypertension: Secondary | ICD-10-CM | POA: Diagnosis not present

## 2019-09-04 DIAGNOSIS — G4733 Obstructive sleep apnea (adult) (pediatric): Secondary | ICD-10-CM | POA: Diagnosis present

## 2019-09-04 DIAGNOSIS — Z79899 Other long term (current) drug therapy: Secondary | ICD-10-CM

## 2019-09-04 DIAGNOSIS — E785 Hyperlipidemia, unspecified: Secondary | ICD-10-CM | POA: Diagnosis present

## 2019-09-04 DIAGNOSIS — D72829 Elevated white blood cell count, unspecified: Secondary | ICD-10-CM | POA: Diagnosis present

## 2019-09-04 DIAGNOSIS — D509 Iron deficiency anemia, unspecified: Secondary | ICD-10-CM | POA: Diagnosis present

## 2019-09-04 DIAGNOSIS — I480 Paroxysmal atrial fibrillation: Secondary | ICD-10-CM | POA: Diagnosis present

## 2019-09-04 DIAGNOSIS — Z950 Presence of cardiac pacemaker: Secondary | ICD-10-CM

## 2019-09-04 DIAGNOSIS — E7800 Pure hypercholesterolemia, unspecified: Secondary | ICD-10-CM | POA: Diagnosis present

## 2019-09-04 DIAGNOSIS — E119 Type 2 diabetes mellitus without complications: Secondary | ICD-10-CM

## 2019-09-04 DIAGNOSIS — Z7901 Long term (current) use of anticoagulants: Secondary | ICD-10-CM

## 2019-09-04 DIAGNOSIS — Y9301 Activity, walking, marching and hiking: Secondary | ICD-10-CM | POA: Diagnosis present

## 2019-09-04 DIAGNOSIS — E039 Hypothyroidism, unspecified: Secondary | ICD-10-CM | POA: Diagnosis present

## 2019-09-04 DIAGNOSIS — N183 Chronic kidney disease, stage 3 unspecified: Secondary | ICD-10-CM | POA: Diagnosis present

## 2019-09-04 DIAGNOSIS — I440 Atrioventricular block, first degree: Secondary | ICD-10-CM | POA: Diagnosis present

## 2019-09-04 DIAGNOSIS — M25562 Pain in left knee: Secondary | ICD-10-CM | POA: Diagnosis present

## 2019-09-04 DIAGNOSIS — G4730 Sleep apnea, unspecified: Secondary | ICD-10-CM | POA: Diagnosis present

## 2019-09-04 DIAGNOSIS — M810 Age-related osteoporosis without current pathological fracture: Secondary | ICD-10-CM | POA: Diagnosis present

## 2019-09-04 DIAGNOSIS — Z7951 Long term (current) use of inhaled steroids: Secondary | ICD-10-CM

## 2019-09-04 DIAGNOSIS — Z8719 Personal history of other diseases of the digestive system: Secondary | ICD-10-CM

## 2019-09-04 DIAGNOSIS — I495 Sick sinus syndrome: Secondary | ICD-10-CM | POA: Diagnosis present

## 2019-09-04 HISTORY — DX: Other complications of anesthesia, initial encounter: T88.59XA

## 2019-09-04 LAB — CBC WITH DIFFERENTIAL/PLATELET
Abs Immature Granulocytes: 0.19 10*3/uL — ABNORMAL HIGH (ref 0.00–0.07)
Basophils Absolute: 0.1 10*3/uL (ref 0.0–0.1)
Basophils Relative: 1 %
Eosinophils Absolute: 0.1 10*3/uL (ref 0.0–0.5)
Eosinophils Relative: 1 %
HCT: 36.5 % (ref 36.0–46.0)
Hemoglobin: 11.9 g/dL — ABNORMAL LOW (ref 12.0–15.0)
Immature Granulocytes: 1 %
Lymphocytes Relative: 14 %
Lymphs Abs: 2.2 10*3/uL (ref 0.7–4.0)
MCH: 27.8 pg (ref 26.0–34.0)
MCHC: 32.6 g/dL (ref 30.0–36.0)
MCV: 85.3 fL (ref 80.0–100.0)
Monocytes Absolute: 1 10*3/uL (ref 0.1–1.0)
Monocytes Relative: 6 %
Neutro Abs: 11.8 10*3/uL — ABNORMAL HIGH (ref 1.7–7.7)
Neutrophils Relative %: 77 %
Platelets: 212 10*3/uL (ref 150–400)
RBC: 4.28 MIL/uL (ref 3.87–5.11)
RDW: 15 % (ref 11.5–15.5)
WBC: 15.4 10*3/uL — ABNORMAL HIGH (ref 4.0–10.5)
nRBC: 0 % (ref 0.0–0.2)

## 2019-09-04 LAB — BASIC METABOLIC PANEL
Anion gap: 10 (ref 5–15)
BUN: 37 mg/dL — ABNORMAL HIGH (ref 8–23)
CO2: 22 mmol/L (ref 22–32)
Calcium: 9.1 mg/dL (ref 8.9–10.3)
Chloride: 105 mmol/L (ref 98–111)
Creatinine, Ser: 1.21 mg/dL — ABNORMAL HIGH (ref 0.44–1.00)
GFR calc Af Amer: 48 mL/min — ABNORMAL LOW (ref >60)
GFR calc non Af Amer: 41 mL/min — ABNORMAL LOW (ref >60)
Glucose, Bld: 99 mg/dL (ref 70–99)
Potassium: 4.2 mmol/L (ref 3.5–5.1)
Sodium: 137 mmol/L (ref 135–145)

## 2019-09-04 MED ORDER — CEFAZOLIN SODIUM-DEXTROSE 2-4 GM/100ML-% IV SOLN
2.0000 g | INTRAVENOUS | Status: AC
  Administered 2019-09-05: 2 g via INTRAVENOUS
  Filled 2019-09-04: qty 100

## 2019-09-04 MED ORDER — MORPHINE SULFATE (PF) 4 MG/ML IV SOLN
4.0000 mg | Freq: Once | INTRAVENOUS | Status: AC
  Administered 2019-09-04: 4 mg via INTRAVENOUS
  Filled 2019-09-04: qty 1

## 2019-09-04 MED ORDER — LORAZEPAM 2 MG/ML IJ SOLN
INTRAMUSCULAR | Status: AC
  Administered 2019-09-05: 0.5 mg via INTRAVENOUS
  Filled 2019-09-04: qty 1

## 2019-09-04 NOTE — ED Triage Notes (Addendum)
Pt brought in via ems from home.  Pt fell at home in the kitchen.  Pt has left hip pain.  Pt has swelling to left knee  No loc.  No back or neck pain.  Pt alert  Speech clear.

## 2019-09-04 NOTE — ED Provider Notes (Signed)
[facility] Emergency Department Provider Note   ____________________________________________   First MD Initiated Contact with Patient 09/04/19 2156     (approximate)  I have reviewed the triage vital signs and the nursing notes.   HISTORY  Chief Complaint Fall    HPI Krystal Goodwin is a 86 y.o. female with past medical history of hypertension, hyperlipidemia, diabetes, asthma, aortic stenosis, and colon cancer who presents to the ED following fall.  Patient reports that she was walking in her home when she fell backwards onto her backside.  She is not sure what caused her to fall and she does not think she hit her head, denies losing consciousness.  She now primarily complains of pain in her left hip, also states she has had increasing pain in her left knee for the past couple of days with associated swelling.  She denies any headache, vision changes, numbness, or weakness.  She denies any pain to her chest or abdomen.        Past Medical History:  Diagnosis Date  . Anemia    iron def.anemia  . Aortic stenosis, severe   . Asthma   . Chest pain   . Colon cancer ([place])   . Diabetes mellitus without complication ([place])   . Dyslipidemia   . Dyspnea   . Gout, joint   . H/O postmenopausal osteoporosis   . Hematuria   . History of palpitations   . Hx of colonic polyps   . Hypertension   . Hypothyroidism   . IBS (irritable bowel syndrome)   . Schatzki's ring   . Sleep apnea     There are no problems to display for this patient.   Past Surgical History:  Procedure Laterality Date  . AORTIC VALVE REPLACEMENT    . CARDIAC PACEMAKER PLACEMENT    . CHOLECYSTECTOMY    . COLON SURGERY  1997   RIGHT HEMICOLECTOMY  . COLONOSCOPY    . COLONOSCOPY WITH PROPOFOL N/A 09/07/2016   Procedure: COLONOSCOPY WITH PROPOFOL;  Surgeon: Manya Silvas, MD;  Location: [facility] ENDOSCOPY;  Service: Endoscopy;  Laterality: N/A;  . ESOPHAGOGASTRODUODENOSCOPY    . TEE  WITHOUT CARDIOVERSION  03/08/2013    Prior to Admission medications   Medication Sig Start Date End Date Taking? Authorizing Provider  ALPRAZolam Duanne Moron) 0.5 MG tablet Take 0.5 mg by mouth at bedtime as needed for anxiety.    [provider]  amLODipine (NORVASC) 10 MG tablet Take 10 mg by mouth daily.    [provider]  aspirin EC 81 MG tablet Take 81 mg by mouth daily.    [provider]  budesonide-formoterol (SYMBICORT) 160-4.5 MCG/ACT inhaler Inhale 2 puffs into the lungs 2 (two) times daily.    [provider]  ferrous sulfate 325 (65 FE) MG tablet Take 325 mg by mouth daily with breakfast.    [provider]  furosemide (LASIX) 40 MG tablet Take 40 mg by mouth.    [provider]  glipiZIDE (GLUCOTROL XL) 10 MG 24 hr tablet Take 10 mg by mouth daily with breakfast.    [provider]  levothyroxine (SYNTHROID, LEVOTHROID) 88 MCG tablet Take 88 mcg by mouth daily before breakfast.    [provider]  losartan (COZAAR) 100 MG tablet Take 100 mg by mouth daily.    [provider]  Melatonin-Pyridoxine 1-10 MG TABS Take by mouth at bedtime as needed.    [provider]  Multiple Vitamin (MULTIVITAMIN) tablet  Take 1 tablet by mouth daily.    [provider]  pantoprazole (PROTONIX) 40 MG tablet Take 40 mg by mouth daily.    [provider]  PARoxetine (PAXIL) 20 MG tablet Take 20 mg by mouth daily.    [provider]  traMADol (ULTRAM) 50 MG tablet Take 50 mg by mouth every 6 (six) hours as needed.    [provider]    Allergies Amoxicillin, Erythromycin, and Vicodin [hydrocodone-acetaminophen]  Family History  Problem Relation Age of Onset  . Breast cancer Neg Hx     Social History Social History   Tobacco Use  . Smoking status: Former Smoker    Packs/day: 1.00    Years: 20.00    Pack years: 20.00    Types: Cigarettes    Quit date: 02/21/1989     Years since quitting: 30.5  . Smokeless tobacco: Never Used  Vaping Use  . Vaping Use: Never used  Substance Use Topics  . Alcohol use: No  . Drug use: No    Review of Systems  Constitutional: No fever/chills Eyes: No visual changes. ENT: No sore throat. Cardiovascular: Denies chest pain. Respiratory: Denies shortness of breath. Gastrointestinal: No abdominal pain.  No nausea, no vomiting.  No diarrhea.  No constipation. Genitourinary: Negative for dysuria. Musculoskeletal: Negative for back pain.  Positive for left hip pain and left knee pain. Skin: Negative for rash. Neurological: Negative for headaches, focal weakness or numbness.  ____________________________________________   PHYSICAL EXAM:  VITAL SIGNS: ED Triage Vitals  Enc Vitals Group     BP 09/04/19 2055 (!) 147/53     Pulse Rate 09/04/19 2055 83     Resp 09/04/19 2055 17     Temp 09/04/19 2055 98.1 F (36.7 C)     Temp Source 09/04/19 2055 Oral     SpO2 09/04/19 2055 98 %     Weight 09/04/19 2055 200 lb (90.7 kg)     Height 09/04/19 2055 5\' 5"  (1.651 m)     Head Circumference --      Peak Flow --      Pain Score 09/04/19 2108 10     Pain Loc --      Pain Edu? --      Excl. in [place]? --     Constitutional: Alert and oriented. Eyes: Conjunctivae are normal. Head: Atraumatic. Nose: No congestion/rhinnorhea. Mouth/Throat: Mucous membranes are moist. Neck: Normal ROM, no midline cervical spine tenderness. Cardiovascular: Normal rate, regular rhythm. Grossly normal heart sounds. Respiratory: Normal respiratory effort.  No retractions. Lungs CTAB. Gastrointestinal: Soft and nontender. No distention. Genitourinary: deferred Musculoskeletal: Diffuse tenderness to palpation at left hip with range of motion of left hip limited secondary to pain.  Diffuse tenderness to left knee with mild edema.  2+ DP pulses bilaterally, full range of motion to right lower extremity without pain.  No tenderness to bilateral upper  extremities. Neurologic:  Normal speech and language. No gross focal neurologic deficits are appreciated. Skin:  Skin is warm, dry and intact. No rash noted. Psychiatric: Mood and affect are normal. Speech and behavior are normal.  ____________________________________________   LABS (all labs ordered are listed, but only abnormal results are displayed)  Labs Reviewed  CBC WITH DIFFERENTIAL/PLATELET - Abnormal; Notable for the following components:      Result Value   WBC 15.4 (*)    Hemoglobin 11.9 (*)    Neutro Abs 11.8 (*)    Abs Immature Granulocytes 0.19 (*)  All other components within normal limits  BASIC METABOLIC PANEL - Abnormal; Notable for the following components:   BUN 37 (*)    Creatinine, Ser 1.21 (*)    GFR calc non Af Amer 41 (*)    GFR calc Af Amer 48 (*)    All other components within normal limits  SARS CORONAVIRUS 2 BY RT PCR (HOSPITAL ORDER, [place] [facility])  URINALYSIS, COMPLETE (UACMP) WITH MICROSCOPIC   ____________________________________________  EKG  ED ECG REPORT I, Blake Divine, the attending physician, personally viewed and interpreted this ECG.   Date: 09/04/2019  EKG Time: 23:03  Rate: 87  Rhythm: normal sinus rhythm  Axis: Normal  Intervals:none  ST&T Change: None   PROCEDURES  Procedure(s) performed (including Critical Care):  Procedures   ____________________________________________   INITIAL IMPRESSION / ASSESSMENT AND PLAN / ED COURSE       86 year old female with past medical history of hypertension, hyperlipidemia, diabetes, asthma, colon cancer, and aortic stenosis who presents to the ED following fall of unclear etiology.  She now primarily complains of left hip pain, but is unsure whether she hit her head.  She is neurologically intact with no focal deficits.  Initial x-rays of chest, left hip, and left knee are negative for acute process.  We will further assess with CT scan of head,  C-spine, and left hip, also screen EKG and labs given unclear etiology of fall.  CT head and C-spine are negative for acute process, CT of left hip is consistent with femoral neck fracture.  Case discussed with Dr. Roland Rack of orthopedics, who states that patient will likely undergo surgery tomorrow.  Her pain is improved following IV morphine and she remains neurovascularly intact to her left lower extremity.  Lab work thus far is unremarkable, case discussed with hospitalist for admission.      ____________________________________________   FINAL CLINICAL IMPRESSION(S) / ED DIAGNOSES  Final diagnoses:  Closed fracture of left hip, initial encounter [facility])     ED Discharge Orders    None       Note:  This document was prepared using Dragon voice recognition software and may include unintentional dictation errors.   Blake Divine, MD 09/05/19 0001

## 2019-09-04 NOTE — ED Notes (Signed)
Pt transported to CT ?

## 2019-09-05 ENCOUNTER — Inpatient Hospital Stay: Payer: Medicare PPO | Admitting: Anesthesiology

## 2019-09-05 ENCOUNTER — Encounter
Admission: EM | Disposition: A | Discharge status: Skilled Nursing Facility | Payer: Self-pay | Source: Home / Self Care | Attending: Internal Medicine

## 2019-09-05 ENCOUNTER — Encounter: Payer: Self-pay | Admitting: Family Medicine

## 2019-09-05 ENCOUNTER — Inpatient Hospital Stay: Payer: Medicare PPO

## 2019-09-05 ENCOUNTER — Other Ambulatory Visit: Payer: Self-pay

## 2019-09-05 DIAGNOSIS — I129 Hypertensive chronic kidney disease with stage 1 through stage 4 chronic kidney disease, or unspecified chronic kidney disease: Secondary | ICD-10-CM | POA: Diagnosis present

## 2019-09-05 DIAGNOSIS — S72002A Fracture of unspecified part of neck of left femur, initial encounter for closed fracture: Secondary | ICD-10-CM

## 2019-09-05 DIAGNOSIS — M25562 Pain in left knee: Secondary | ICD-10-CM | POA: Diagnosis present

## 2019-09-05 DIAGNOSIS — I495 Sick sinus syndrome: Secondary | ICD-10-CM | POA: Diagnosis present

## 2019-09-05 DIAGNOSIS — J45909 Unspecified asthma, uncomplicated: Secondary | ICD-10-CM | POA: Diagnosis present

## 2019-09-05 DIAGNOSIS — Z7901 Long term (current) use of anticoagulants: Secondary | ICD-10-CM | POA: Diagnosis not present

## 2019-09-05 DIAGNOSIS — I480 Paroxysmal atrial fibrillation: Secondary | ICD-10-CM | POA: Diagnosis present

## 2019-09-05 DIAGNOSIS — W19XXXA Unspecified fall, initial encounter: Secondary | ICD-10-CM | POA: Diagnosis not present

## 2019-09-05 DIAGNOSIS — E039 Hypothyroidism, unspecified: Secondary | ICD-10-CM | POA: Diagnosis present

## 2019-09-05 DIAGNOSIS — S72012A Unspecified intracapsular fracture of left femur, initial encounter for closed fracture: Secondary | ICD-10-CM | POA: Diagnosis present

## 2019-09-05 DIAGNOSIS — Z7984 Long term (current) use of oral hypoglycemic drugs: Secondary | ICD-10-CM | POA: Diagnosis not present

## 2019-09-05 DIAGNOSIS — I10 Essential (primary) hypertension: Secondary | ICD-10-CM | POA: Diagnosis present

## 2019-09-05 DIAGNOSIS — E119 Type 2 diabetes mellitus without complications: Secondary | ICD-10-CM

## 2019-09-05 DIAGNOSIS — E785 Hyperlipidemia, unspecified: Secondary | ICD-10-CM | POA: Diagnosis present

## 2019-09-05 DIAGNOSIS — Y9301 Activity, walking, marching and hiking: Secondary | ICD-10-CM | POA: Diagnosis present

## 2019-09-05 DIAGNOSIS — Z20822 Contact with and (suspected) exposure to covid-19: Secondary | ICD-10-CM | POA: Diagnosis present

## 2019-09-05 DIAGNOSIS — D509 Iron deficiency anemia, unspecified: Secondary | ICD-10-CM | POA: Diagnosis present

## 2019-09-05 DIAGNOSIS — Z7951 Long term (current) use of inhaled steroids: Secondary | ICD-10-CM | POA: Diagnosis not present

## 2019-09-05 DIAGNOSIS — Z85038 Personal history of other malignant neoplasm of large intestine: Secondary | ICD-10-CM | POA: Diagnosis not present

## 2019-09-05 DIAGNOSIS — Z952 Presence of prosthetic heart valve: Secondary | ICD-10-CM | POA: Diagnosis not present

## 2019-09-05 DIAGNOSIS — Y92000 Kitchen of unspecified non-institutional (private) residence as  the place of occurrence of the external cause: Secondary | ICD-10-CM | POA: Diagnosis not present

## 2019-09-05 DIAGNOSIS — Z79890 Hormone replacement therapy (postmenopausal): Secondary | ICD-10-CM | POA: Diagnosis not present

## 2019-09-05 DIAGNOSIS — Z79899 Other long term (current) drug therapy: Secondary | ICD-10-CM | POA: Diagnosis not present

## 2019-09-05 DIAGNOSIS — N183 Chronic kidney disease, stage 3 unspecified: Secondary | ICD-10-CM | POA: Diagnosis present

## 2019-09-05 DIAGNOSIS — E1122 Type 2 diabetes mellitus with diabetic chronic kidney disease: Secondary | ICD-10-CM | POA: Diagnosis present

## 2019-09-05 DIAGNOSIS — G4730 Sleep apnea, unspecified: Secondary | ICD-10-CM | POA: Diagnosis present

## 2019-09-05 DIAGNOSIS — W010XXA Fall on same level from slipping, tripping and stumbling without subsequent striking against object, initial encounter: Secondary | ICD-10-CM | POA: Diagnosis present

## 2019-09-05 DIAGNOSIS — Z9049 Acquired absence of other specified parts of digestive tract: Secondary | ICD-10-CM | POA: Diagnosis not present

## 2019-09-05 DIAGNOSIS — Z950 Presence of cardiac pacemaker: Secondary | ICD-10-CM | POA: Diagnosis not present

## 2019-09-05 DIAGNOSIS — G4733 Obstructive sleep apnea (adult) (pediatric): Secondary | ICD-10-CM | POA: Diagnosis present

## 2019-09-05 DIAGNOSIS — N1832 Chronic kidney disease, stage 3b: Secondary | ICD-10-CM | POA: Diagnosis present

## 2019-09-05 DIAGNOSIS — M810 Age-related osteoporosis without current pathological fracture: Secondary | ICD-10-CM | POA: Diagnosis present

## 2019-09-05 DIAGNOSIS — Z87891 Personal history of nicotine dependence: Secondary | ICD-10-CM | POA: Diagnosis not present

## 2019-09-05 HISTORY — PX: HIP ARTHROPLASTY: SHX981

## 2019-09-05 LAB — BASIC METABOLIC PANEL
Anion gap: 9 (ref 5–15)
BUN: 35 mg/dL — ABNORMAL HIGH (ref 8–23)
CO2: 26 mmol/L (ref 22–32)
Calcium: 8.9 mg/dL (ref 8.9–10.3)
Chloride: 104 mmol/L (ref 98–111)
Creatinine, Ser: 1.29 mg/dL — ABNORMAL HIGH (ref 0.44–1.00)
GFR calc Af Amer: 44 mL/min — ABNORMAL LOW (ref >60)
GFR calc non Af Amer: 38 mL/min — ABNORMAL LOW (ref >60)
Glucose, Bld: 141 mg/dL — ABNORMAL HIGH (ref 70–99)
Potassium: 4 mmol/L (ref 3.5–5.1)
Sodium: 139 mmol/L (ref 135–145)

## 2019-09-05 LAB — HEMOGLOBIN A1C
Hgb A1c MFr Bld: 5.5 % (ref 4.8–5.6)
Mean Plasma Glucose: 111.15 mg/dL

## 2019-09-05 LAB — CBC
HCT: 34.1 % — ABNORMAL LOW (ref 36.0–46.0)
Hemoglobin: 10.7 g/dL — ABNORMAL LOW (ref 12.0–15.0)
MCH: 27.2 pg (ref 26.0–34.0)
MCHC: 31.4 g/dL (ref 30.0–36.0)
MCV: 86.8 fL (ref 80.0–100.0)
Platelets: 203 10*3/uL (ref 150–400)
RBC: 3.93 MIL/uL (ref 3.87–5.11)
RDW: 14.6 % (ref 11.5–15.5)
WBC: 12.7 10*3/uL — ABNORMAL HIGH (ref 4.0–10.5)
nRBC: 0 % (ref 0.0–0.2)

## 2019-09-05 LAB — TYPE AND SCREEN
ABO/RH(D): O POS
Antibody Screen: NEGATIVE

## 2019-09-05 LAB — ABO/RH: ABO/RH(D): O POS

## 2019-09-05 LAB — GLUCOSE, CAPILLARY
Glucose-Capillary: 100 mg/dL — ABNORMAL HIGH (ref 70–99)
Glucose-Capillary: 107 mg/dL — ABNORMAL HIGH (ref 70–99)
Glucose-Capillary: 127 mg/dL — ABNORMAL HIGH (ref 70–99)
Glucose-Capillary: 128 mg/dL — ABNORMAL HIGH (ref 70–99)
Glucose-Capillary: 139 mg/dL — ABNORMAL HIGH (ref 70–99)
Glucose-Capillary: 154 mg/dL — ABNORMAL HIGH (ref 70–99)
Glucose-Capillary: 80 mg/dL (ref 70–99)
Glucose-Capillary: 97 mg/dL (ref 70–99)

## 2019-09-05 LAB — SARS CORONAVIRUS 2 BY RT PCR (HOSPITAL ORDER, PERFORMED IN ~~LOC~~ HOSPITAL LAB): SARS Coronavirus 2: NEGATIVE

## 2019-09-05 SURGERY — HEMIARTHROPLASTY, HIP, DIRECT ANTERIOR APPROACH, FOR FRACTURE
Anesthesia: General | Site: Hip | Laterality: Left

## 2019-09-05 MED ORDER — SODIUM CHLORIDE FLUSH 0.9 % IV SOLN
INTRAVENOUS | Status: AC
  Filled 2019-09-05: qty 40

## 2019-09-05 MED ORDER — ACETAMINOPHEN 10 MG/ML IV SOLN
INTRAVENOUS | Status: AC
  Filled 2019-09-05: qty 100

## 2019-09-05 MED ORDER — HYDROCODONE-ACETAMINOPHEN 5-325 MG PO TABS
1.0000 | ORAL_TABLET | ORAL | Status: DC | PRN
  Administered 2019-09-06 (×2): 1 via ORAL
  Administered 2019-09-06 – 2019-09-09 (×3): 2 via ORAL
  Administered 2019-09-10 (×2): 1 via ORAL
  Filled 2019-09-05: qty 2
  Filled 2019-09-05: qty 1
  Filled 2019-09-05: qty 2
  Filled 2019-09-05: qty 1
  Filled 2019-09-05: qty 2
  Filled 2019-09-05 (×2): qty 1

## 2019-09-05 MED ORDER — CEFAZOLIN SODIUM-DEXTROSE 2-4 GM/100ML-% IV SOLN
INTRAVENOUS | Status: AC
  Filled 2019-09-05: qty 100

## 2019-09-05 MED ORDER — LORAZEPAM 2 MG/ML IJ SOLN: 0.5000 mg | Freq: Once | INTRAMUSCULAR | Status: AC

## 2019-09-05 MED ORDER — FUROSEMIDE 40 MG PO TABS: 40.0000 mg | ORAL_TABLET | Freq: Every day | ORAL | Status: DC

## 2019-09-05 MED ORDER — BISACODYL 10 MG RE SUPP
10.0000 mg | Freq: Every day | RECTAL | Status: DC | PRN
  Administered 2019-09-09: 10 mg via RECTAL
  Filled 2019-09-05: qty 1

## 2019-09-05 MED ORDER — BUPIVACAINE LIPOSOME 1.3 % IJ SUSP: INTRAMUSCULAR | Status: DC | PRN

## 2019-09-05 MED ORDER — SODIUM CHLORIDE 0.45 % IV SOLN: INTRAVENOUS | Status: AC

## 2019-09-05 MED ORDER — ACETAMINOPHEN 325 MG PO TABS: 325.0000 mg | ORAL_TABLET | ORAL | Status: DC | PRN

## 2019-09-05 MED ORDER — ACETAMINOPHEN 500 MG PO TABS
500.0000 mg | ORAL_TABLET | Freq: Four times a day (QID) | ORAL | Status: AC
  Administered 2019-09-05 – 2019-09-06 (×4): 500 mg via ORAL
  Filled 2019-09-05 (×4): qty 1

## 2019-09-05 MED ORDER — ROCURONIUM BROMIDE 100 MG/10ML IV SOLN
INTRAVENOUS | Status: DC | PRN
  Administered 2019-09-05: 50 mg via INTRAVENOUS

## 2019-09-05 MED ORDER — MAGNESIUM HYDROXIDE 400 MG/5ML PO SUSP
30.0000 mL | Freq: Every day | ORAL | Status: DC | PRN
  Administered 2019-09-08: 30 mL via ORAL
  Filled 2019-09-05: qty 30

## 2019-09-05 MED ORDER — PHENOL 1.4 % MT LIQD
1.0000 | OROMUCOSAL | Status: DC | PRN
  Filled 2019-09-05: qty 177

## 2019-09-05 MED ORDER — PROPOFOL 10 MG/ML IV BOLUS
INTRAVENOUS | Status: DC | PRN
  Administered 2019-09-05: 80 mg via INTRAVENOUS

## 2019-09-05 MED ORDER — BUPIVACAINE LIPOSOME 1.3 % IJ SUSP
INTRAMUSCULAR | Status: AC
  Filled 2019-09-05: qty 20

## 2019-09-05 MED ORDER — METOCLOPRAMIDE HCL 5 MG/ML IJ SOLN: 5.0000 mg | Freq: Three times a day (TID) | INTRAMUSCULAR | Status: DC | PRN

## 2019-09-05 MED ORDER — DEXTROSE-NACL 5-0.45 % IV SOLN: INTRAVENOUS | Status: DC

## 2019-09-05 MED ORDER — CEFAZOLIN SODIUM-DEXTROSE 2-4 GM/100ML-% IV SOLN
2.0000 g | Freq: Four times a day (QID) | INTRAVENOUS | Status: AC
  Administered 2019-09-05 – 2019-09-06 (×3): 2 g via INTRAVENOUS
  Filled 2019-09-05 (×5): qty 100

## 2019-09-05 MED ORDER — SODIUM CHLORIDE 0.45 % IV SOLN: INTRAVENOUS | Status: DC

## 2019-09-05 MED ORDER — FENTANYL CITRATE (PF) 100 MCG/2ML IJ SOLN: 25.0000 ug | INTRAMUSCULAR | Status: DC | PRN

## 2019-09-05 MED ORDER — ACETAMINOPHEN 160 MG/5ML PO SOLN
325.0000 mg | ORAL | Status: DC | PRN
  Filled 2019-09-05: qty 20.3

## 2019-09-05 MED ORDER — ATORVASTATIN CALCIUM 10 MG PO TABS
10.0000 mg | ORAL_TABLET | Freq: Every evening | ORAL | Status: DC
  Administered 2019-09-05 – 2019-09-09 (×5): 10 mg via ORAL
  Filled 2019-09-05 (×5): qty 1

## 2019-09-05 MED ORDER — PANTOPRAZOLE SODIUM 40 MG PO TBEC
40.0000 mg | DELAYED_RELEASE_TABLET | Freq: Every day | ORAL | Status: DC
  Administered 2019-09-05 – 2019-09-10 (×6): 40 mg via ORAL
  Filled 2019-09-05 (×6): qty 1

## 2019-09-05 MED ORDER — MORPHINE SULFATE (PF) 2 MG/ML IV SOLN: 0.5000 mg | INTRAVENOUS | Status: DC | PRN

## 2019-09-05 MED ORDER — SODIUM CHLORIDE FLUSH 0.9 % IV SOLN
INTRAVENOUS | Status: AC
  Filled 2019-09-05: qty 10

## 2019-09-05 MED ORDER — BUPIVACAINE-EPINEPHRINE (PF) 0.5% -1:200000 IJ SOLN
INTRAMUSCULAR | Status: DC | PRN
  Administered 2019-09-05: 30 mL

## 2019-09-05 MED ORDER — GLYCOPYRROLATE 0.2 MG/ML IJ SOLN
INTRAMUSCULAR | Status: DC | PRN
  Administered 2019-09-05: .2 mg via INTRAVENOUS

## 2019-09-05 MED ORDER — ENSURE MAX PROTEIN PO LIQD
11.0000 [oz_av] | Freq: Every day | ORAL | Status: DC
  Administered 2019-09-06 – 2019-09-10 (×5): 11 [oz_av] via ORAL
  Filled 2019-09-05: qty 330

## 2019-09-05 MED ORDER — SODIUM CHLORIDE 0.9 % IV SOLN
INTRAVENOUS | Status: DC | PRN
  Administered 2019-09-05: 40 ug/min via INTRAVENOUS

## 2019-09-05 MED ORDER — CITALOPRAM HYDROBROMIDE 20 MG PO TABS
20.0000 mg | ORAL_TABLET | Freq: Every day | ORAL | Status: DC
  Administered 2019-09-06 – 2019-09-10 (×5): 20 mg via ORAL
  Filled 2019-09-05 (×6): qty 1

## 2019-09-05 MED ORDER — METOCLOPRAMIDE HCL 10 MG PO TABS: 5.0000 mg | ORAL_TABLET | Freq: Three times a day (TID) | ORAL | Status: DC | PRN

## 2019-09-05 MED ORDER — TRAMADOL HCL 50 MG PO TABS
50.0000 mg | ORAL_TABLET | Freq: Four times a day (QID) | ORAL | Status: DC | PRN
  Administered 2019-09-05: 50 mg via ORAL
  Filled 2019-09-05: qty 1

## 2019-09-05 MED ORDER — METOPROLOL SUCCINATE ER 25 MG PO TB24
25.0000 mg | ORAL_TABLET | Freq: Every day | ORAL | Status: DC
  Administered 2019-09-05 – 2019-09-10 (×6): 25 mg via ORAL
  Filled 2019-09-05 (×6): qty 1

## 2019-09-05 MED ORDER — SUGAMMADEX SODIUM 200 MG/2ML IV SOLN
INTRAVENOUS | Status: DC | PRN
  Administered 2019-09-05: 200 mg via INTRAVENOUS

## 2019-09-05 MED ORDER — APIXABAN 5 MG PO TABS
5.0000 mg | ORAL_TABLET | Freq: Two times a day (BID) | ORAL | Status: DC
  Administered 2019-09-06 – 2019-09-10 (×9): 5 mg via ORAL
  Filled 2019-09-05 (×9): qty 1

## 2019-09-05 MED ORDER — LIDOCAINE HCL (CARDIAC) PF 100 MG/5ML IV SOSY
PREFILLED_SYRINGE | INTRAVENOUS | Status: DC | PRN
  Administered 2019-09-05: 20 mg via INTRAVENOUS

## 2019-09-05 MED ORDER — TRAMADOL HCL 50 MG PO TABS
50.0000 mg | ORAL_TABLET | Freq: Four times a day (QID) | ORAL | Status: DC | PRN
  Filled 2019-09-05: qty 1

## 2019-09-05 MED ORDER — DIPHENHYDRAMINE HCL 12.5 MG/5ML PO ELIX: 12.5000 mg | ORAL_SOLUTION | ORAL | Status: DC | PRN

## 2019-09-05 MED ORDER — TRAMADOL HCL 50 MG PO TABS: 50.0000 mg | ORAL_TABLET | Freq: Four times a day (QID) | ORAL | Status: DC | PRN

## 2019-09-05 MED ORDER — INSULIN ASPART 100 UNIT/ML ~~LOC~~ SOLN
0.0000 [IU] | SUBCUTANEOUS | Status: DC
  Administered 2019-09-05: 2 [IU] via SUBCUTANEOUS
  Administered 2019-09-05 – 2019-09-06 (×3): 1 [IU] via SUBCUTANEOUS
  Administered 2019-09-06: 2 [IU] via SUBCUTANEOUS
  Administered 2019-09-06 – 2019-09-07 (×4): 1 [IU] via SUBCUTANEOUS
  Filled 2019-09-05 (×9): qty 1

## 2019-09-05 MED ORDER — EPHEDRINE SULFATE 50 MG/ML IJ SOLN
INTRAMUSCULAR | Status: AC
  Filled 2019-09-05: qty 1

## 2019-09-05 MED ORDER — ONDANSETRON HCL 4 MG PO TABS: 4.0000 mg | ORAL_TABLET | Freq: Four times a day (QID) | ORAL | Status: DC | PRN

## 2019-09-05 MED ORDER — ONDANSETRON HCL 4 MG/2ML IJ SOLN: 4.0000 mg | Freq: Four times a day (QID) | INTRAMUSCULAR | Status: DC | PRN

## 2019-09-05 MED ORDER — BUPIVACAINE-EPINEPHRINE (PF) 0.5% -1:200000 IJ SOLN
INTRAMUSCULAR | Status: AC
  Filled 2019-09-05: qty 30

## 2019-09-05 MED ORDER — EPHEDRINE 5 MG/ML INJ
INTRAVENOUS | Status: AC
  Filled 2019-09-05: qty 10

## 2019-09-05 MED ORDER — EPHEDRINE SULFATE 50 MG/ML IJ SOLN
5.0000 mg | Freq: Once | INTRAMUSCULAR | Status: AC
  Administered 2019-09-05: 5 mg via INTRAVENOUS

## 2019-09-05 MED ORDER — GLYCOPYRROLATE 0.2 MG/ML IJ SOLN
INTRAMUSCULAR | Status: AC
  Filled 2019-09-05: qty 1

## 2019-09-05 MED ORDER — TRANEXAMIC ACID 1000 MG/10ML IV SOLN
INTRAVENOUS | Status: AC
  Filled 2019-09-05: qty 10

## 2019-09-05 MED ORDER — SENNOSIDES-DOCUSATE SODIUM 8.6-50 MG PO TABS
1.0000 | ORAL_TABLET | Freq: Every evening | ORAL | Status: DC | PRN
  Administered 2019-09-09: 1 via ORAL
  Filled 2019-09-05: qty 1

## 2019-09-05 MED ORDER — ACETAMINOPHEN 325 MG PO TABS
325.0000 mg | ORAL_TABLET | Freq: Four times a day (QID) | ORAL | Status: DC | PRN
  Administered 2019-09-07 – 2019-09-08 (×3): 650 mg via ORAL
  Filled 2019-09-05 (×3): qty 2

## 2019-09-05 MED ORDER — KETOROLAC TROMETHAMINE 30 MG/ML IJ SOLN
INTRAMUSCULAR | Status: AC
  Filled 2019-09-05: qty 1

## 2019-09-05 MED ORDER — FLEET ENEMA 7-19 GM/118ML RE ENEM: 1.0000 | ENEMA | Freq: Once | RECTAL | Status: DC | PRN

## 2019-09-05 MED ORDER — ALPRAZOLAM 0.25 MG PO TABS
0.2500 mg | ORAL_TABLET | ORAL | Status: DC | PRN
  Administered 2019-09-09: 0.25 mg via ORAL
  Filled 2019-09-05: qty 1

## 2019-09-05 MED ORDER — SODIUM CHLORIDE 0.9 % IV SOLN
INTRAVENOUS | Status: DC | PRN
  Administered 2019-09-05: 60 mL

## 2019-09-05 MED ORDER — PHENYLEPHRINE HCL (PRESSORS) 10 MG/ML IV SOLN
INTRAVENOUS | Status: AC
  Filled 2019-09-05: qty 1

## 2019-09-05 MED ORDER — KETAMINE HCL 50 MG/ML IJ SOLN
INTRAMUSCULAR | Status: DC | PRN
  Administered 2019-09-05: 40 mg via INTRAMUSCULAR

## 2019-09-05 MED ORDER — PROMETHAZINE HCL 25 MG/ML IJ SOLN: 6.2500 mg | INTRAMUSCULAR | Status: DC | PRN

## 2019-09-05 MED ORDER — LEVOTHYROXINE SODIUM 100 MCG PO TABS
100.0000 ug | ORAL_TABLET | Freq: Every day | ORAL | Status: DC
  Administered 2019-09-06 – 2019-09-10 (×5): 100 ug via ORAL
  Filled 2019-09-05 (×5): qty 1

## 2019-09-05 MED ORDER — FENTANYL CITRATE (PF) 100 MCG/2ML IJ SOLN
INTRAMUSCULAR | Status: DC | PRN
  Administered 2019-09-05 (×2): 50 ug via INTRAVENOUS

## 2019-09-05 MED ORDER — LOSARTAN POTASSIUM 50 MG PO TABS: 100.0000 mg | ORAL_TABLET | Freq: Every day | ORAL | Status: DC

## 2019-09-05 MED ORDER — KETAMINE HCL 50 MG/ML IJ SOLN
INTRAMUSCULAR | Status: AC
  Filled 2019-09-05: qty 10

## 2019-09-05 MED ORDER — DOCUSATE SODIUM 100 MG PO CAPS
100.0000 mg | ORAL_CAPSULE | Freq: Two times a day (BID) | ORAL | Status: DC
  Administered 2019-09-05 – 2019-09-10 (×10): 100 mg via ORAL
  Filled 2019-09-05 (×10): qty 1

## 2019-09-05 MED ORDER — ADULT MULTIVITAMIN W/MINERALS CH
1.0000 | ORAL_TABLET | Freq: Every day | ORAL | Status: DC
  Administered 2019-09-06 – 2019-09-10 (×5): 1 via ORAL
  Filled 2019-09-05 (×5): qty 1

## 2019-09-05 MED ORDER — PHENYLEPHRINE HCL (PRESSORS) 10 MG/ML IV SOLN
INTRAVENOUS | Status: DC | PRN
  Administered 2019-09-05 (×3): 100 ug via INTRAVENOUS

## 2019-09-05 MED ORDER — FENTANYL CITRATE (PF) 100 MCG/2ML IJ SOLN
INTRAMUSCULAR | Status: AC
  Filled 2019-09-05: qty 2

## 2019-09-05 MED ORDER — FERROUS SULFATE 325 (65 FE) MG PO TABS
325.0000 mg | ORAL_TABLET | Freq: Every day | ORAL | Status: DC
  Administered 2019-09-06 – 2019-09-10 (×5): 325 mg via ORAL
  Filled 2019-09-05 (×5): qty 1

## 2019-09-05 MED ORDER — TRANEXAMIC ACID 1000 MG/10ML IV SOLN
INTRAVENOUS | Status: DC | PRN
  Administered 2019-09-05: 1000 mg via TOPICAL

## 2019-09-05 MED ORDER — PROPOFOL 10 MG/ML IV BOLUS
INTRAVENOUS | Status: AC
  Filled 2019-09-05: qty 20

## 2019-09-05 MED ORDER — EPHEDRINE SULFATE 50 MG/ML IJ SOLN
INTRAMUSCULAR | Status: DC | PRN
  Administered 2019-09-05: 10 mg via INTRAVENOUS

## 2019-09-05 MED ORDER — SODIUM CHLORIDE 0.9 % IV BOLUS: 500.0000 mL | Freq: Once | INTRAVENOUS | Status: DC

## 2019-09-05 SURGICAL SUPPLY — 64 items
BAG DECANTER FOR FLEXI CONT (MISCELLANEOUS) IMPLANT
BLADE SAGITTAL WIDE XTHICK NO (BLADE) ×3 IMPLANT
BLADE SURG SZ20 CARB STEEL (BLADE) ×3 IMPLANT
BNDG COHESIVE 6X5 TAN STRL LF (GAUZE/BANDAGES/DRESSINGS) ×3 IMPLANT
BOWL CEMENT MIXING ADV NOZZLE (MISCELLANEOUS) IMPLANT
CANISTER SUCT 1200ML W/VALVE (MISCELLANEOUS) ×3 IMPLANT
CANISTER SUCT 3000ML PPV (MISCELLANEOUS) ×6 IMPLANT
CHLORAPREP W/TINT 26 (MISCELLANEOUS) ×6 IMPLANT
COVER WAND RF STERILE (DRAPES) ×3 IMPLANT
DECANTER SPIKE VIAL GLASS SM (MISCELLANEOUS) IMPLANT
DRAPE 3/4 80X56 (DRAPES) ×3 IMPLANT
DRAPE INCISE IOBAN 66X60 STRL (DRAPES) ×3 IMPLANT
DRAPE SPLIT 6X30 W/TAPE (DRAPES) ×6 IMPLANT
DRAPE SURG 17X11 SM STRL (DRAPES) ×3 IMPLANT
DRAPE SURG 17X23 STRL (DRAPES) ×3 IMPLANT
DRSG OPSITE POSTOP 4X12 (GAUZE/BANDAGES/DRESSINGS) ×3 IMPLANT
DRSG OPSITE POSTOP 4X14 (GAUZE/BANDAGES/DRESSINGS) IMPLANT
DRSG OPSITE POSTOP 4X8 (GAUZE/BANDAGES/DRESSINGS) IMPLANT
ELECT BLADE 6.5 EXT (BLADE) ×3 IMPLANT
ELECT CAUTERY BLADE 6.4 (BLADE) ×3 IMPLANT
ELECT REM PT RETURN 9FT ADLT (ELECTROSURGICAL) ×3
ELECTRODE REM PT RTRN 9FT ADLT (ELECTROSURGICAL) ×1 IMPLANT
GAUZE PACK 2X3YD (GAUZE/BANDAGES/DRESSINGS) IMPLANT
GLOVE BIO SURGEON STRL SZ8 (GLOVE) ×6 IMPLANT
GLOVE BIOGEL M STRL SZ7.5 (GLOVE) IMPLANT
GLOVE BIOGEL PI IND STRL 8 (GLOVE) IMPLANT
GLOVE BIOGEL PI INDICATOR 8 (GLOVE)
GLOVE INDICATOR 8.0 STRL GRN (GLOVE) ×3 IMPLANT
GOWN STRL REUS W/ TWL LRG LVL3 (GOWN DISPOSABLE) ×1 IMPLANT
GOWN STRL REUS W/ TWL XL LVL3 (GOWN DISPOSABLE) ×1 IMPLANT
GOWN STRL REUS W/TWL LRG LVL3 (GOWN DISPOSABLE) ×2
GOWN STRL REUS W/TWL XL LVL3 (GOWN DISPOSABLE) ×2
HANDLE YANKAUER SUCT BULB TIP (MISCELLANEOUS) ×3 IMPLANT
HEAD ENDO II MOD SZ 47 (Orthopedic Implant) ×3 IMPLANT
HOOD PEEL AWAY FLYTE STAYCOOL (MISCELLANEOUS) ×12 IMPLANT
INSERT TAPER ENDO II STD (Orthopedic Implant) ×3 IMPLANT
IV NS 100ML SINGLE PACK (IV SOLUTION) IMPLANT
LABEL OR SOLS (LABEL) ×3 IMPLANT
NDL SAFETY ECLIPSE 18X1.5 (NEEDLE) ×1 IMPLANT
NEEDLE FILTER BLUNT 18X 1/2SAF (NEEDLE) ×2
NEEDLE FILTER BLUNT 18X1 1/2 (NEEDLE) ×1 IMPLANT
NEEDLE HYPO 18GX1.5 SHARP (NEEDLE) ×2
NEEDLE SPNL 20GX3.5 QUINCKE YW (NEEDLE) ×3 IMPLANT
NS IRRIG 1000ML POUR BTL (IV SOLUTION) ×3 IMPLANT
PACK HIP PROSTHESIS (MISCELLANEOUS) ×3 IMPLANT
PULSAVAC PLUS IRRIG FAN TIP (DISPOSABLE) ×3
SOL .9 NS 3000ML IRR  AL (IV SOLUTION) ×4
SOL .9 NS 3000ML IRR UROMATIC (IV SOLUTION) ×2 IMPLANT
STAPLER SKIN PROX 35W (STAPLE) ×3 IMPLANT
STEM FEMORAL 11MM X 130MM ECHO (Stem) ×3 IMPLANT
STRAP SAFETY 5IN WIDE (MISCELLANEOUS) ×3 IMPLANT
SUT ETHIBOND 2 V 37 (SUTURE) ×3 IMPLANT
SUT VIC AB 1 CT1 36 (SUTURE) IMPLANT
SUT VIC AB 2-0 CT1 (SUTURE) ×6 IMPLANT
SUT VIC AB 2-0 CT1 27 (SUTURE) ×2
SUT VIC AB 2-0 CT1 TAPERPNT 27 (SUTURE) ×1 IMPLANT
SUT VICRYL 1-0 27IN ABS (SUTURE) ×6
SUTURE VICRYL 1-0 27IN ABS (SUTURE) ×2 IMPLANT
SYR 10ML LL (SYRINGE) ×3 IMPLANT
SYR 30ML LL (SYRINGE) ×9 IMPLANT
SYR TB 1ML 27GX1/2 LL (SYRINGE) IMPLANT
TAPE TRANSPORE STRL 2 31045 (GAUZE/BANDAGES/DRESSINGS) ×3 IMPLANT
TIP BRUSH PULSAVAC PLUS 24.33 (MISCELLANEOUS) ×3 IMPLANT
TIP FAN IRRIG PULSAVAC PLUS (DISPOSABLE) ×1 IMPLANT

## 2019-09-05 NOTE — Anesthesia Procedure Notes (Signed)
Procedure Name: Intubation Date/Time: 09/05/2019 2:53 PM Performed by: Lerry Liner, CRNA Pre-anesthesia Checklist: Patient identified, Emergency Drugs available, Suction available, Patient being monitored and Timeout performed Patient Re-evaluated:Patient Re-evaluated prior to induction Oxygen Delivery Method: Circle system utilized Preoxygenation: Pre-oxygenation with 100% oxygen Induction Type: IV induction Ventilation: Mask ventilation without difficulty Laryngoscope Size: McGraph and 3 Grade View: Grade I Tube type: Oral Tube size: 7.0 mm Number of attempts: 1 Airway Equipment and Method: Stylet Placement Confirmation: ETT inserted through vocal cords under direct vision Secured at: 21 cm Tube secured with: Tape Dental Injury: Teeth and Oropharynx as per pre-operative assessment

## 2019-09-05 NOTE — Progress Notes (Signed)
[place] [facility] [place] NAME: Krystal Goodwin    MR#:  [id number]  DATE OF BIRTH:  03-Mar-1933  SUBJECTIVE:  patient came in from [place] independent living after mechanical fall. Complains of left hip pain. Daughter in the room. Denies chest pain or shortness of breath.  REVIEW OF SYSTEMS:   Review of Systems  Constitutional: Negative for chills, fever and weight loss.  HENT: Negative for ear discharge, ear pain and nosebleeds.   Eyes: Negative for blurred vision, pain and discharge.  Respiratory: Negative for sputum production, shortness of breath, wheezing and stridor.   Cardiovascular: Negative for chest pain, palpitations, orthopnea and PND.  Gastrointestinal: Negative for abdominal pain, diarrhea, nausea and vomiting.  Genitourinary: Negative for frequency and urgency.  Musculoskeletal: Positive for falls and joint pain. Negative for back pain.  Neurological: Positive for weakness. Negative for sensory change, speech change and focal weakness.  Psychiatric/Behavioral: Positive for hallucinations. Negative for depression. The patient is not nervous/anxious.    Tolerating Diet:npo Tolerating PT: pending  DRUG ALLERGIES:   Allergies  Allergen Reactions  . Amoxicillin Itching    Breaks out in a rash  . Erythromycin Nausea And Vomiting  . Vicodin [Hydrocodone-Acetaminophen] Nausea And Vomiting    VITALS:  Blood pressure (!) 113/43, pulse 78, temperature 98.7 F (37.1 C), temperature source Oral, resp. rate 17, height 5\' 5"  (1.651 m), weight 90.7 kg, SpO2 100 %.  PHYSICAL EXAMINATION:   Physical Exam  GENERAL:  86 y.o.-year-old patient lying in the bed with no acute distress. Obese EYES: Pupils equal, round, reactive to light and accommodation. No scleral icterus.   HEENT: Head atraumatic, normocephalic. Oropharynx and nasopharynx clear.  NECK:  Supple, no jugular venous distention. No thyroid enlargement, no tenderness.  LUNGS:  Normal breath sounds bilaterally, no wheezing, rales, rhonchi. No use of accessory muscles of respiration.  CARDIOVASCULAR: S1, S2 normal. No murmurs, rubs, or gallops.  ABDOMEN: Soft, nontender, nondistended. Bowel sounds present. No organomegaly or mass.  EXTREMITIES: left lower extremity restricted range of motion secondary to fracture NEUROLOGIC: Cranial nerves II through XII are intact. No focal Motor or sensory deficits b/l.   PSYCHIATRIC:  patient is alert and oriented x 3.  SKIN: No obvious rash, lesion, or ulcer.   LABORATORY PANEL:  CBC Recent Labs  Lab 09/05/19 0353  WBC 12.7*  HGB 10.7*  HCT 34.1*  PLT 203    Chemistries  Recent Labs  Lab 09/05/19 0353  NA 139  K 4.0  CL 104  CO2 26  GLUCOSE 141*  BUN 35*  CREATININE 1.29*  CALCIUM 8.9   Cardiac Enzymes No results for input(s): TROPONINI in the last 168 hours. RADIOLOGY:  DG Chest 1 View  Result Date: 09/04/2019 CLINICAL DATA:  Recent fall with left hip pain, initial encounter EXAM: CHEST  1 VIEW COMPARISON:  08/02/2013 FINDINGS: Cardiac shadow is within normal limits. Postsurgical changes are noted. Pacing device is again seen. The lungs are well aerated bilaterally. No focal infiltrate or sizable effusion is noted. No bony abnormality noted. IMPRESSION: No active disease. Electronically Signed   By: Inez Catalina M.D.   On: 09/04/2019 22:00   CT Head Wo Contrast  Result Date: 09/04/2019 CLINICAL DATA:  Fall at home EXAM: CT HEAD WITHOUT CONTRAST TECHNIQUE: Contiguous axial images were obtained from the base of the skull through the vertex without intravenous contrast. COMPARISON:  None. FINDINGS: Brain: No evidence of acute territorial infarction, hemorrhage, hydrocephalus,extra-axial collection or  mass lesion/mass effect. There is dilatation the ventricles and sulci consistent with age-related atrophy. Low-attenuation changes in the deep white matter consistent with small vessel ischemia. Vascular: No hyperdense  vessel or unexpected calcification. Skull: The skull is intact. No fracture or focal lesion identified. Sinuses/Orbits: The visualized paranasal sinuses and mastoid air cells are clear. The orbits and globes intact. Other: None Cervical spine: Alignment: There is straightening of the normal cervical lordosis. Skull base and vertebrae: Visualized skull base is intact. No atlanto-occipital dissociation. The vertebral body heights are well maintained. No fracture or pathologic osseous lesion seen. Soft tissues and spinal canal: The visualized paraspinal soft tissues are unremarkable. No prevertebral soft tissue swelling is seen. The spinal canal is grossly unremarkable, no large epidural collection or significant canal narrowing. Disc levels: Multilevel cervical spine spondylosis is seen with disc height loss disc osteophyte complex and uncovertebral osteophytes most C5-C6 C6-C7 mild neural foraminal narrowing and central stenosis. Upper chest: The lung apices are clear. Thoracic inlet is within normal limits. Scattered carotid artery calcifications are seen. Other: None IMPRESSION: No acute intracranial abnormality. Findings consistent with age related atrophy and chronic small vessel ischemia No acute fracture or malalignment of the spine. Electronically Signed   By: Prudencio Pair M.D.   On: 09/04/2019 23:02   CT Cervical Spine Wo Contrast  Result Date: 09/04/2019 CLINICAL DATA:  Fall at home EXAM: CT HEAD WITHOUT CONTRAST TECHNIQUE: Contiguous axial images were obtained from the base of the skull through the vertex without intravenous contrast. COMPARISON:  None. FINDINGS: Brain: No evidence of acute territorial infarction, hemorrhage, hydrocephalus,extra-axial collection or mass lesion/mass effect. There is dilatation the ventricles and sulci consistent with age-related atrophy. Low-attenuation changes in the deep white matter consistent with small vessel ischemia. Vascular: No hyperdense vessel or unexpected  calcification. Skull: The skull is intact. No fracture or focal lesion identified. Sinuses/Orbits: The visualized paranasal sinuses and mastoid air cells are clear. The orbits and globes intact. Other: None Cervical spine: Alignment: There is straightening of the normal cervical lordosis. Skull base and vertebrae: Visualized skull base is intact. No atlanto-occipital dissociation. The vertebral body heights are well maintained. No fracture or pathologic osseous lesion seen. Soft tissues and spinal canal: The visualized paraspinal soft tissues are unremarkable. No prevertebral soft tissue swelling is seen. The spinal canal is grossly unremarkable, no large epidural collection or significant canal narrowing. Disc levels: Multilevel cervical spine spondylosis is seen with disc height loss disc osteophyte complex and uncovertebral osteophytes most C5-C6 C6-C7 mild neural foraminal narrowing and central stenosis. Upper chest: The lung apices are clear. Thoracic inlet is within normal limits. Scattered carotid artery calcifications are seen. Other: None IMPRESSION: No acute intracranial abnormality. Findings consistent with age related atrophy and chronic small vessel ischemia No acute fracture or malalignment of the spine. Electronically Signed   By: Prudencio Pair M.D.   On: 09/04/2019 23:02   CT Hip Left Wo Contrast  Result Date: 09/04/2019 CLINICAL DATA:  Left hip pain after fall at home in the kitchen. EXAM: CT OF THE LEFT HIP WITHOUT CONTRAST TECHNIQUE: Multidetector CT imaging of the left hip was performed according to the standard protocol. Multiplanar CT image reconstructions were also generated. COMPARISON:  Radiograph earlier this day. FINDINGS: Bones/Joint/Cartilage Impacted mildly displaced subcapital femoral neck fracture. Mild comminution and apex anterior angulation. Acetabulum and pubic rami are intact. Minor hip joint osteoarthritis. Ligaments Suboptimally assessed by CT. Muscles and Tendons No  evidence of intramuscular hematoma. Soft tissues No focal soft tissue  hematoma. IMPRESSION: Impacted mildly displaced subcapital femoral neck fracture. Electronically Signed   By: Keith Rake M.D.   On: 09/04/2019 22:59   DG Knee Complete 4 Views Left  Result Date: 09/04/2019 CLINICAL DATA:  Recent fall with left knee pain, initial encounter EXAM: LEFT KNEE - COMPLETE 4+ VIEW COMPARISON:  None. FINDINGS: Degenerative changes are noted within the lateral and patellofemoral joint spaces. No joint effusion is seen. No acute fracture or dislocation is noted. IMPRESSION: Degenerative change without acute abnormality. Electronically Signed   By: Inez Catalina M.D.   On: 09/04/2019 21:58   DG Hip Unilat With Pelvis 2-3 Views Left  Result Date: 09/04/2019 CLINICAL DATA:  Recent fall with hip pain, initial encounter EXAM: DG HIP (WITH OR WITHOUT PELVIS) 3V LEFT COMPARISON:  None. FINDINGS: Pelvic ring is intact. Degenerative changes of lower lumbar spine are noted. Right hip shows mild degenerative change. The femur somewhat rotated although no definitive fracture is seen. No soft tissue abnormality is noted. IMPRESSION: No definitive fracture is noted. Electronically Signed   By: Inez Catalina M.D.   On: 09/04/2019 21:57   ASSESSMENT AND PLAN:   Krystal Goodwin is a 86 y.o. female with medical history significant for type 2 diabetes mellitus, hypertension, hypothyroidism, chronic kidney disease stage III, heart block with pacer, and paroxysmal atrial fibrillation on Eliquis, now presenting to the emergency department with severe left hip and knee pain after fall at home.  Patient reports that she had been in her usual state of health but fell twice recently, is unsure of exactly how this happened, but denies any loss of consciousness or head injury.  She fell backwards last night and began to experience severe left hip pain  1. Left hip fracture  status post mechanical fall - Presents with left hip pain after  a fall and is found to have femoral neck fracture  - Orthopedic surgery consult with Dr Roland Rack appreciated -- surgery plan for 230 this afternoon - Based on the available data, Mrs. Diop presents an estimated 1.7% risk of perioperative MI or cardiac arrest  -cardiology consultation with Dr. Nehemiah Massed placed  for preop cardiac clearance. Patient has history of paroxysmal atrial fibrillation and severe aortic stenosis status post aortic valve replacement by prostatic.  - Hold Eliquis (last dose 9:30 am on 7/28) - hold ARB, continue pain-control and supportive care   2. Paroxysmal atrial fibrillation  - Patient reports last dose of Eliquis was 09:30 am on 09/04/19  - Hold Eliquis preoperatively   -continue beta-blockers  3. Type II DM  - A1c was normal recently and she was going to be given a trial off of glipizide  - Check CBGs and use a low-intensity SSI with Novolog if needed    4. CKD IIIb - SCr is 1.21 on admission, similar to priors  - Renally-dose medications, monitor    5. OSA  - Continue CPAP qHS   6. Hypertension  - BP at goal, hold losartan preoperatively     DVT prophylaxis: Eliquis pta, held on admission  Code Status: Full  Family Communication: Discussed with patient  and daughter in the room Disposition Plan:  Patient is from: Home  Anticipated d/c is to: TBD Anticipated d/c date is: 09/08/19 Patient currently:  patient is going for hip repair  Consults called: Orthopedic surgery consulted by ED physician  Admission status: Inpatient    TOTAL TIME TAKING CARE OF THIS PATIENT: *25* minutes.  >50% time spent on counselling and coordination  of care  Note: This dictation was prepared with Dragon dictation along with smaller phrase technology. Any transcriptional errors that result from this process are unintentional.  Fritzi Mandes M.D    [facility]   CC: Primary care physician; Derinda Late, MDPatient ID: Krystal Goodwin, female   DOB: 12-31-1933, 86  y.o.   MRN: [id number]

## 2019-09-05 NOTE — Consult Note (Signed)
ORTHOPAEDIC CONSULTATION  REQUESTING PHYSICIAN: Fritzi Mandes, MD  Chief Complaint:   Left hip pain.  History of Present Illness: Krystal Goodwin is an 86 y.o. female with multiple medical problems including severe aortic stenosis, diabetes, paroxysmal atrial fibrillation, gout, hypertension, hypothyroidism, iron deficiency anemia, irritable bowel syndrome, hypercholesterolemia, asthma, and sleep apnea who lives independently.  Apparently, the patient was in her kitchen last evening when she lost her balance and fell backwards onto her left hip.  She was unable to get up so EMS was called.  Upon presentation to the emergency room, x-rays of her pelvis and left hip were inconclusive for a left hip fracture.  However, an impacted left femoral neck fracture was confirmed by pelvic CT scan.  The patient denies any associated injuries.  She did not strike her head or lose consciousness.  She denies any lightheadedness, dizziness, chest pain, shortness of breath, or other symptoms which may have precipitated her fall.  However, she does note that she has fallen 7 or 8 times recently.  Past Medical History:  Diagnosis Date  . Anemia    iron def.anemia  . Aortic stenosis, severe   . Asthma   . Chest pain   . Colon cancer ([place])   . Diabetes mellitus without complication ([place])   . Dyslipidemia   . Dyspnea   . Gout, joint   . H/O postmenopausal osteoporosis   . Hematuria   . History of palpitations   . Hx of colonic polyps   . Hypertension   . Hypothyroidism   . IBS (irritable bowel syndrome)   . Schatzki's ring   . Sleep apnea    Past Surgical History:  Procedure Laterality Date  . AORTIC VALVE REPLACEMENT    . CARDIAC PACEMAKER PLACEMENT    . CHOLECYSTECTOMY    . COLON SURGERY  1997   RIGHT HEMICOLECTOMY  . COLONOSCOPY    . COLONOSCOPY WITH PROPOFOL N/A 09/07/2016   Procedure: COLONOSCOPY WITH PROPOFOL;  Surgeon: Manya Silvas, MD;  Location: [facility] ENDOSCOPY;  Service: Endoscopy;  Laterality: N/A;  . ESOPHAGOGASTRODUODENOSCOPY    . TEE WITHOUT CARDIOVERSION  03/08/2013   Social History   Socioeconomic History  . Marital status: Widowed    Spouse name: Not on file  . Number of children: Not on file  . Years of education: Not on file  . Highest education level: Not on file  Occupational History  . Not on file  Tobacco Use  . Smoking status: Former Smoker    Packs/day: 1.00    Years: 20.00    Pack years: 20.00    Types: Cigarettes    Quit date: 02/21/1989    Years since quitting: 30.5  . Smokeless tobacco: Never Used  Vaping Use  . Vaping Use: Never used  Substance and Sexual Activity  . Alcohol use: No  . Drug use: No  . Sexual activity: Not on file  Other Topics Concern  . Not on file  Social History Narrative  . Not on file   Social Determinants of Health   Financial Resource Strain:   . Difficulty of Paying Living Expenses:   Food Insecurity:   . Worried About Charity fundraiser in the Last Year:   . Arboriculturist in the Last Year:   Transportation Needs:   . Film/video editor (Medical):   Marland Kitchen Lack of Transportation (Non-Medical):   Physical Activity:   . Days of Exercise per Week:   . Minutes of  Exercise per Session:   Stress:   . Feeling of Stress :   Social Connections:   . Frequency of Communication with Friends and Family:   . Frequency of Social Gatherings with Friends and Family:   . Attends Religious Services:   . Active Member of Clubs or Organizations:   . Attends Archivist Meetings:   Marland Kitchen Marital Status:    Family History  Problem Relation Age of Onset  . Breast cancer Neg Hx    Allergies  Allergen Reactions  . Amoxicillin Itching    Breaks out in a rash  . Erythromycin Nausea And Vomiting  . Vicodin [Hydrocodone-Acetaminophen] Nausea And Vomiting   Prior to Admission medications   Medication Sig Start Date End Date Taking? Authorizing Provider   ALPRAZolam (XANAX) 0.25 MG tablet Take 0.25-0.5 mg by mouth every 4 (four) hours as needed for anxiety or sleep.   Yes [provider]  atorvastatin (LIPITOR) 10 MG tablet Take 10 mg by mouth daily. 06/20/19  Yes [provider]  citalopram (CELEXA) 20 MG tablet Take 20 mg by mouth daily. 08/21/19  Yes [provider]  ELIQUIS 5 MG TABS tablet Take 5 mg by mouth 2 (two) times daily. 08/21/19  Yes [provider]  ferrous sulfate 325 (65 FE) MG tablet Take 325 mg by mouth daily with breakfast.   Yes [provider]  furosemide (LASIX) 40 MG tablet Take 40 mg by mouth.   Yes [provider]  glipiZIDE (GLUCOTROL XL) 5 MG 24 hr tablet Take 5 mg by mouth daily with breakfast.    Yes [provider]  levothyroxine (SYNTHROID) 100 MCG tablet Take 100 mcg by mouth daily before breakfast.    Yes [provider]  losartan (COZAAR) 100 MG tablet Take 100 mg by mouth daily.   Yes [provider]  Melatonin-Pyridoxine 1-10 MG TABS Take by mouth at bedtime as needed.   Yes [provider]  metoprolol succinate (TOPROL-XL) 25 MG 24 hr tablet Take 25 mg by mouth daily. 06/20/19  Yes [provider]  Multiple Vitamin (MULTIVITAMIN) tablet Take 1 tablet by mouth daily.   Yes [provider]  pantoprazole (PROTONIX) 40 MG tablet Take 40 mg by mouth daily.   Yes [provider]   DG Chest 1 View  Result Date: 09/04/2019 CLINICAL DATA:  Recent fall with left hip pain, initial encounter EXAM: CHEST  1 VIEW COMPARISON:  08/02/2013 FINDINGS: Cardiac shadow is within normal limits. Postsurgical changes are noted. Pacing device is again seen. The lungs are well aerated bilaterally. No focal infiltrate or sizable effusion is noted. No bony abnormality noted. IMPRESSION: No active disease. Electronically Signed   By: Inez Catalina M.D.   On: 09/04/2019 22:00   CT Head Wo Contrast  Result Date:  09/04/2019 CLINICAL DATA:  Fall at home EXAM: CT HEAD WITHOUT CONTRAST TECHNIQUE: Contiguous axial images were obtained from the base of the skull through the vertex without intravenous contrast. COMPARISON:  None. FINDINGS: Brain: No evidence of acute territorial infarction, hemorrhage, hydrocephalus,extra-axial collection or mass lesion/mass effect. There is dilatation the ventricles and sulci consistent with age-related atrophy. Low-attenuation changes in the deep white matter consistent with small vessel ischemia. Vascular: No hyperdense vessel or unexpected calcification. Skull: The skull is intact. No fracture or focal lesion identified. Sinuses/Orbits: The visualized paranasal sinuses and mastoid air cells are clear. The orbits and globes intact. Other: None Cervical spine: Alignment: There is straightening of the normal  cervical lordosis. Skull base and vertebrae: Visualized skull base is intact. No atlanto-occipital dissociation. The vertebral body heights are well maintained. No fracture or pathologic osseous lesion seen. Soft tissues and spinal canal: The visualized paraspinal soft tissues are unremarkable. No prevertebral soft tissue swelling is seen. The spinal canal is grossly unremarkable, no large epidural collection or significant canal narrowing. Disc levels: Multilevel cervical spine spondylosis is seen with disc height loss disc osteophyte complex and uncovertebral osteophytes most C5-C6 C6-C7 mild neural foraminal narrowing and central stenosis. Upper chest: The lung apices are clear. Thoracic inlet is within normal limits. Scattered carotid artery calcifications are seen. Other: None IMPRESSION: No acute intracranial abnormality. Findings consistent with age related atrophy and chronic small vessel ischemia No acute fracture or malalignment of the spine. Electronically Signed   By: Prudencio Pair M.D.   On: 09/04/2019 23:02   CT Cervical Spine Wo Contrast  Result Date: 09/04/2019 CLINICAL  DATA:  Fall at home EXAM: CT HEAD WITHOUT CONTRAST TECHNIQUE: Contiguous axial images were obtained from the base of the skull through the vertex without intravenous contrast. COMPARISON:  None. FINDINGS: Brain: No evidence of acute territorial infarction, hemorrhage, hydrocephalus,extra-axial collection or mass lesion/mass effect. There is dilatation the ventricles and sulci consistent with age-related atrophy. Low-attenuation changes in the deep white matter consistent with small vessel ischemia. Vascular: No hyperdense vessel or unexpected calcification. Skull: The skull is intact. No fracture or focal lesion identified. Sinuses/Orbits: The visualized paranasal sinuses and mastoid air cells are clear. The orbits and globes intact. Other: None Cervical spine: Alignment: There is straightening of the normal cervical lordosis. Skull base and vertebrae: Visualized skull base is intact. No atlanto-occipital dissociation. The vertebral body heights are well maintained. No fracture or pathologic osseous lesion seen. Soft tissues and spinal canal: The visualized paraspinal soft tissues are unremarkable. No prevertebral soft tissue swelling is seen. The spinal canal is grossly unremarkable, no large epidural collection or significant canal narrowing. Disc levels: Multilevel cervical spine spondylosis is seen with disc height loss disc osteophyte complex and uncovertebral osteophytes most C5-C6 C6-C7 mild neural foraminal narrowing and central stenosis. Upper chest: The lung apices are clear. Thoracic inlet is within normal limits. Scattered carotid artery calcifications are seen. Other: None IMPRESSION: No acute intracranial abnormality. Findings consistent with age related atrophy and chronic small vessel ischemia No acute fracture or malalignment of the spine. Electronically Signed   By: Prudencio Pair M.D.   On: 09/04/2019 23:02   CT Hip Left Wo Contrast  Result Date: 09/04/2019 CLINICAL DATA:  Left hip pain after  fall at home in the kitchen. EXAM: CT OF THE LEFT HIP WITHOUT CONTRAST TECHNIQUE: Multidetector CT imaging of the left hip was performed according to the standard protocol. Multiplanar CT image reconstructions were also generated. COMPARISON:  Radiograph earlier this day. FINDINGS: Bones/Joint/Cartilage Impacted mildly displaced subcapital femoral neck fracture. Mild comminution and apex anterior angulation. Acetabulum and pubic rami are intact. Minor hip joint osteoarthritis. Ligaments Suboptimally assessed by CT. Muscles and Tendons No evidence of intramuscular hematoma. Soft tissues No focal soft tissue hematoma. IMPRESSION: Impacted mildly displaced subcapital femoral neck fracture. Electronically Signed   By: Keith Rake M.D.   On: 09/04/2019 22:59   DG Knee Complete 4 Views Left  Result Date: 09/04/2019 CLINICAL DATA:  Recent fall with left knee pain, initial encounter EXAM: LEFT KNEE - COMPLETE 4+ VIEW COMPARISON:  None. FINDINGS: Degenerative changes are noted within the lateral and patellofemoral joint spaces. No joint effusion is  seen. No acute fracture or dislocation is noted. IMPRESSION: Degenerative change without acute abnormality. Electronically Signed   By: Inez Catalina M.D.   On: 09/04/2019 21:58   DG Hip Unilat With Pelvis 2-3 Views Left  Result Date: 09/04/2019 CLINICAL DATA:  Recent fall with hip pain, initial encounter EXAM: DG HIP (WITH OR WITHOUT PELVIS) 3V LEFT COMPARISON:  None. FINDINGS: Pelvic ring is intact. Degenerative changes of lower lumbar spine are noted. Right hip shows mild degenerative change. The femur somewhat rotated although no definitive fracture is seen. No soft tissue abnormality is noted. IMPRESSION: No definitive fracture is noted. Electronically Signed   By: Inez Catalina M.D.   On: 09/04/2019 21:57    Positive ROS: All other systems have been reviewed and were otherwise negative with the exception of those mentioned in the HPI and as above.  Physical  Exam: General:  Alert and appropriately responsive, no acute distress Psychiatric:  Patient exhibits normal mood and affect   Cardiovascular:  No pedal edema Respiratory:  No wheezing, non-labored breathing GI:  Abdomen is soft and non-tender Skin:  No lesions in the area of chief complaint Neurologic:  Sensation intact distally Lymphatic:  No axillary or cervical lymphadenopathy  Orthopedic Exam:  Orthopedic examination is limited to the left hip and lower extremity.  The left lower extremity appears to be slightly shortened and externally rotated as compared to the right.  Skin inspection around the left hip is unremarkable.  No swelling, erythema, ecchymosis, abrasions, or other skin abnormalities are identified.  She has at most minimal tenderness to palpation over the lateral aspect of the left hip.  She has more severe pain with any attempted active or passive motion of the hip.  She is able to dorsiflex and plantarflex her toes and ankle on the left.  Sensation is intact to light touch to all distributions.  She has good capillary refill to her left foot.  X-rays:  Recent x-rays of the pelvis and left hip, as well as a CT scan of the pelvis are available for review and have been reviewed by myself.  These films demonstrate an impacted/displaced left femoral neck fracture.  No obvious lytic lesions are identified.  There is at most minimal degenerative changes of the hip joint noted.  No other acute bony abnormalities are identified.  Assessment: Impacted/displaced left femoral neck fracture.  Plan: The treatment options have been discussed with the patient and her daughter who is at the bedside, including both surgical and nonsurgical choices.  The patient and her daughter would like to proceed with surgical intervention to include a left hip hemiarthroplasty.  This procedure has been discussed in detail, as have the potential risks (including bleeding, infection, nerve and/or blood vessel  injury, persistent or recurrent pain, stiffness, dislocation, leg length inequality, loosening of and/or failure of the components, need for further surgery, blood clots, strokes, heart attacks and/or arrhythmias, etc.) and benefits.  The patient and her daughter state their understanding and wished to proceed.  A formal written consent will be obtained by the nursing staff.  Thank you for asking me to participate in the care of this most pleasant woman.  I will be happy to follow her with you.   Pascal Lux, MD  Beeper #:  [phone]  09/05/2019 12:58 PM

## 2019-09-05 NOTE — H&P (Addendum)
History and Physical    Krystal Goodwin DOB: Dec 13, 1933 DOA: 09/04/2019  PCP: Derinda Late, MD   Patient coming from: Home   Chief Complaint: fall with left hip and knee pain   HPI: Krystal Goodwin is a 86 y.o. female with medical history significant for type 2 diabetes mellitus, hypertension, hypothyroidism, chronic kidney disease stage III, heart block with pacer, and paroxysmal atrial fibrillation on Eliquis, now presenting to the emergency department with severe left hip and knee pain after fall at home.  Patient reports that she had been in her usual state of health but fell twice recently, is unsure of exactly how this happened, but denies any loss of consciousness or head injury.  She fell backwards last night and began to experience severe left hip pain.  She has chronic knee pain that also worsened after the fall.  She does not believe that she hit her head.  She denies any recent chest pain, cough, shortness of breath, fevers, or chills.  She normally ambulates with a walker or cane around her house but has not been very active recently, mainly due to her chronic knee pain.  She reports last taking her Eliquis at approximately 9:30 AM on 09/04/2019.  ED Course: Upon arrival to the ED, patient is found to be afebrile, saturating well on room air, and with stable blood pressure.  EKG features a sinus or ectopic atrial rhythm with first-degree AV nodal block.  Chemistry panel is notable for creatinine 1.21 which appears similar to priors.  CBC with leukocytosis to 15,400.  Chest x-rays negative for acute cardiopulmonary disease.  Noncontrast head CT is negative for acute intracranial abnormality and there is no acute fracture noted on cervical spine CT.  Radiographs of the left knee are negative and CT of the left hip demonstrates impacted and mildly displaced subcapital femoral neck fracture.  Patient was given morphine and Ativan in the ED.  COVID-19 screening test not yet resulted.   Orthopedic surgery was consulted by the ED physician and hospitalist asked to admit.  Review of Systems:  All other systems reviewed and apart from HPI, are negative.  Past Medical History:  Diagnosis Date   Anemia    iron def.anemia   Aortic stenosis, severe    Asthma    Chest pain    Colon cancer (HCC)    Diabetes mellitus without complication (HCC)    Dyslipidemia    Dyspnea    Gout, joint    H/O postmenopausal osteoporosis    Hematuria    History of palpitations    Hx of colonic polyps    Hypertension    Hypothyroidism    IBS (irritable bowel syndrome)    Schatzki's ring    Sleep apnea     Past Surgical History:  Procedure Laterality Date   AORTIC VALVE REPLACEMENT     CARDIAC PACEMAKER PLACEMENT     CHOLECYSTECTOMY     COLON SURGERY  1997   RIGHT HEMICOLECTOMY   COLONOSCOPY     COLONOSCOPY WITH PROPOFOL N/A 09/07/2016   Procedure: COLONOSCOPY WITH PROPOFOL;  Surgeon: Manya Silvas, MD;  Location: [facility] ENDOSCOPY;  Service: Endoscopy;  Laterality: N/A;   ESOPHAGOGASTRODUODENOSCOPY     TEE WITHOUT CARDIOVERSION  03/08/2013    Social History:   reports that she quit smoking about 30 years ago. Her smoking use included cigarettes. She has a 20.00 pack-year smoking history. She has never used smokeless tobacco. She reports that she does not drink alcohol  and does not use drugs.  Allergies  Allergen Reactions   Amoxicillin Itching   Erythromycin Nausea And Vomiting   Vicodin [Hydrocodone-Acetaminophen] Nausea And Vomiting    Family History  Problem Relation Age of Onset   Breast cancer Neg Hx      Prior to Admission medications   Medication Sig Start Date End Date Taking? Authorizing Provider  ALPRAZolam Duanne Moron) 0.5 MG tablet Take 0.5 mg by mouth at bedtime as needed for anxiety.    [provider]  amLODipine (NORVASC) 10 MG tablet Take 10 mg by mouth daily.    [provider]  aspirin EC 81 MG tablet Take  81 mg by mouth daily.    [provider]  budesonide-formoterol (SYMBICORT) 160-4.5 MCG/ACT inhaler Inhale 2 puffs into the lungs 2 (two) times daily.    [provider]  ferrous sulfate 325 (65 FE) MG tablet Take 325 mg by mouth daily with breakfast.    [provider]  furosemide (LASIX) 40 MG tablet Take 40 mg by mouth.    [provider]  glipiZIDE (GLUCOTROL XL) 10 MG 24 hr tablet Take 10 mg by mouth daily with breakfast.    [provider]  levothyroxine (SYNTHROID, LEVOTHROID) 88 MCG tablet Take 88 mcg by mouth daily before breakfast.    [provider]  losartan (COZAAR) 100 MG tablet Take 100 mg by mouth daily.    [provider]  Melatonin-Pyridoxine 1-10 MG TABS Take by mouth at bedtime as needed.    [provider]  Multiple Vitamin (MULTIVITAMIN) tablet Take 1 tablet by mouth daily.    [provider]  pantoprazole (PROTONIX) 40 MG tablet Take 40 mg by mouth daily.    [provider]  PARoxetine (PAXIL) 20 MG tablet Take 20 mg by mouth daily.    [provider]  traMADol (ULTRAM) 50 MG tablet Take 50 mg by mouth every 6 (six) hours as needed.    [provider]    Physical Exam: Vitals:   09/04/19 2230 09/04/19 2359 09/05/19 0000 09/05/19 0030  BP: (!) 135/42  (!) 133/47 (!) 137/50  Pulse: 76 80 79 86  Resp:  20    Temp:      TempSrc:      SpO2: 98% 96% 93% 92%  Weight:      Height:        Constitutional: NAD, calm  Eyes: PERTLA, lids and conjunctivae normal ENMT: Mucous membranes are moist. Posterior pharynx clear of any exudate or lesions.   Neck: normal, supple, no masses, no thyromegaly Respiratory: no wheezing, no crackles. No accessory muscle use.  Cardiovascular: S1 & S2 heard, regular rate and rhythm. No extremity edema.  Abdomen: No distension, no tenderness, soft. Bowel sounds active.  Musculoskeletal: Left knee and hip tenderness, neurovascularly intact  distally. No joint deformity upper and lower extremities.   Skin: no significant rashes, lesions, ulcers. Warm, dry, well-perfused. Neurologic: CN 2-12 grossly intact. Sensation intact. Moving all extremities.  Psychiatric: Alert and oriented to person, place, and situation. Pleasant and cooperative.    Labs and Imaging on Admission: I have personally reviewed following labs and imaging studies  CBC: Recent Labs  Lab 09/04/19 2320  WBC 15.4*  NEUTROABS 11.8*  HGB 11.9*  HCT 36.5  MCV 85.3  PLT 010   Basic Metabolic Panel: Recent Labs  Lab 09/04/19 2320  NA 137  K 4.2  CL 105  CO2 22  GLUCOSE 99  BUN 37*  CREATININE 1.21*  CALCIUM 9.1   GFR: Estimated Creatinine Clearance: 38.5 mL/min (A) (by C-G formula based on SCr of 1.21 mg/dL (H)). Liver Function Tests: No results for input(s): AST, ALT, ALKPHOS, BILITOT, PROT, ALBUMIN in the last 168 hours. No results for input(s): LIPASE, AMYLASE in the last 168 hours. No results for input(s): AMMONIA in the last 168 hours. Coagulation Profile: No results for input(s): INR, PROTIME in the last 168 hours. Cardiac Enzymes: No results for input(s): CKTOTAL, CKMB, CKMBINDEX, TROPONINI in the last 168 hours. BNP (last 3 results) No results for input(s): PROBNP in the last 8760 hours. HbA1C: No results for input(s): HGBA1C in the last 72 hours. CBG: Recent Labs  Lab 09/05/19 0113  GLUCAP 107*   Lipid Profile: No results for input(s): CHOL, HDL, LDLCALC, TRIG, CHOLHDL, LDLDIRECT in the last 72 hours. Thyroid Function Tests: No results for input(s): TSH, T4TOTAL, FREET4, T3FREE, THYROIDAB in the last 72 hours. Anemia Panel: No results for input(s): VITAMINB12, FOLATE, FERRITIN, TIBC, IRON, RETICCTPCT in the last 72 hours. Urine analysis:    Component Value Date/Time   COLORURINE YELLOW (A) 10/07/2017 1533   APPEARANCEUR HAZY (A) 10/07/2017 1533   APPEARANCEUR Hazy 09/29/2013 1552   LABSPEC 1.011 10/07/2017 1533   LABSPEC  1.016 09/29/2013 1552   PHURINE 5.0 10/07/2017 1533   GLUCOSEU >=500 (A) 10/07/2017 1533   GLUCOSEU Negative 09/29/2013 1552   HGBUR NEGATIVE 10/07/2017 1533   BILIRUBINUR NEGATIVE 10/07/2017 1533   BILIRUBINUR Negative 09/29/2013 1552   KETONESUR NEGATIVE 10/07/2017 1533   PROTEINUR NEGATIVE 10/07/2017 1533   NITRITE NEGATIVE 10/07/2017 1533   LEUKOCYTESUR SMALL (A) 10/07/2017 1533   LEUKOCYTESUR Negative 09/29/2013 1552   Sepsis Labs: @LABRCNTIP (procalcitonin:4,lacticidven:4) ) Recent Results (from the past 240 hour(s))  SARS Coronavirus 2 by RT PCR (hospital order, performed in [place] hospital lab) Nasopharyngeal Nasopharyngeal Swab     Status: None   Collection Time: 09/04/19 11:20 PM   Specimen: Nasopharyngeal Swab  Result Value Ref Range Status   SARS Coronavirus 2 NEGATIVE NEGATIVE Final    Comment: (NOTE) SARS-CoV-2 target nucleic acids are NOT DETECTED.  The SARS-CoV-2 RNA is generally detectable in upper and lower respiratory specimens during the acute phase of infection. The lowest concentration of SARS-CoV-2 viral copies this assay can detect is 250 copies / mL. A negative result does not preclude SARS-CoV-2 infection and should not be used as the sole basis for treatment or other patient management decisions.  A negative result may occur with improper specimen collection / handling, submission of specimen other than nasopharyngeal swab, presence of viral mutation(s) within the areas targeted by this assay, and inadequate number of viral copies (<250 copies / mL). A negative result must be combined with clinical observations, patient history, and epidemiological information.  Fact Sheet for Patients:   StrictlyIdeas.no  Fact Sheet for Healthcare Providers: BankingDealers.co.za  This test is not yet approved or  cleared by the [place] FDA and has been authorized for detection and/or diagnosis of SARS-CoV-2  by FDA under an Emergency Use Authorization (EUA).  This EUA will remain in effect (meaning this test can be used) for the duration of the COVID-19 declaration under Section 564(b)(1) of the Act, 21 U.S.C. section 360bbb-3(b)(1), unless the authorization is terminated or revoked sooner.  Performed at [facility], [address], [place], [place] [address]      Radiological Exams on Admission: DG Chest 1 View  Result Date: 09/04/2019 CLINICAL DATA:  Recent fall with left  hip pain, initial encounter EXAM: CHEST  1 VIEW COMPARISON:  08/02/2013 FINDINGS: Cardiac shadow is within normal limits. Postsurgical changes are noted. Pacing device is again seen. The lungs are well aerated bilaterally. No focal infiltrate or sizable effusion is noted. No bony abnormality noted. IMPRESSION: No active disease. Electronically Signed   By: Inez Catalina M.D.   On: 09/04/2019 22:00   CT Head Wo Contrast  Result Date: 09/04/2019 CLINICAL DATA:  Fall at home EXAM: CT HEAD WITHOUT CONTRAST TECHNIQUE: Contiguous axial images were obtained from the base of the skull through the vertex without intravenous contrast. COMPARISON:  None. FINDINGS: Brain: No evidence of acute territorial infarction, hemorrhage, hydrocephalus,extra-axial collection or mass lesion/mass effect. There is dilatation the ventricles and sulci consistent with age-related atrophy. Low-attenuation changes in the deep white matter consistent with small vessel ischemia. Vascular: No hyperdense vessel or unexpected calcification. Skull: The skull is intact. No fracture or focal lesion identified. Sinuses/Orbits: The visualized paranasal sinuses and mastoid air cells are clear. The orbits and globes intact. Other: None Cervical spine: Alignment: There is straightening of the normal cervical lordosis. Skull base and vertebrae: Visualized skull base is intact. No atlanto-occipital dissociation. The vertebral body heights are well maintained. No  fracture or pathologic osseous lesion seen. Soft tissues and spinal canal: The visualized paraspinal soft tissues are unremarkable. No prevertebral soft tissue swelling is seen. The spinal canal is grossly unremarkable, no large epidural collection or significant canal narrowing. Disc levels: Multilevel cervical spine spondylosis is seen with disc height loss disc osteophyte complex and uncovertebral osteophytes most C5-C6 C6-C7 mild neural foraminal narrowing and central stenosis. Upper chest: The lung apices are clear. Thoracic inlet is within normal limits. Scattered carotid artery calcifications are seen. Other: None IMPRESSION: No acute intracranial abnormality. Findings consistent with age related atrophy and chronic small vessel ischemia No acute fracture or malalignment of the spine. Electronically Signed   By: Prudencio Pair M.D.   On: 09/04/2019 23:02   CT Cervical Spine Wo Contrast  Result Date: 09/04/2019 CLINICAL DATA:  Fall at home EXAM: CT HEAD WITHOUT CONTRAST TECHNIQUE: Contiguous axial images were obtained from the base of the skull through the vertex without intravenous contrast. COMPARISON:  None. FINDINGS: Brain: No evidence of acute territorial infarction, hemorrhage, hydrocephalus,extra-axial collection or mass lesion/mass effect. There is dilatation the ventricles and sulci consistent with age-related atrophy. Low-attenuation changes in the deep white matter consistent with small vessel ischemia. Vascular: No hyperdense vessel or unexpected calcification. Skull: The skull is intact. No fracture or focal lesion identified. Sinuses/Orbits: The visualized paranasal sinuses and mastoid air cells are clear. The orbits and globes intact. Other: None Cervical spine: Alignment: There is straightening of the normal cervical lordosis. Skull base and vertebrae: Visualized skull base is intact. No atlanto-occipital dissociation. The vertebral body heights are well maintained. No fracture or pathologic  osseous lesion seen. Soft tissues and spinal canal: The visualized paraspinal soft tissues are unremarkable. No prevertebral soft tissue swelling is seen. The spinal canal is grossly unremarkable, no large epidural collection or significant canal narrowing. Disc levels: Multilevel cervical spine spondylosis is seen with disc height loss disc osteophyte complex and uncovertebral osteophytes most C5-C6 C6-C7 mild neural foraminal narrowing and central stenosis. Upper chest: The lung apices are clear. Thoracic inlet is within normal limits. Scattered carotid artery calcifications are seen. Other: None IMPRESSION: No acute intracranial abnormality. Findings consistent with age related atrophy and chronic small vessel ischemia No acute fracture or malalignment of the spine. Electronically  Signed   By: Prudencio Pair M.D.   On: 09/04/2019 23:02   CT Hip Left Wo Contrast  Result Date: 09/04/2019 CLINICAL DATA:  Left hip pain after fall at home in the kitchen. EXAM: CT OF THE LEFT HIP WITHOUT CONTRAST TECHNIQUE: Multidetector CT imaging of the left hip was performed according to the standard protocol. Multiplanar CT image reconstructions were also generated. COMPARISON:  Radiograph earlier this day. FINDINGS: Bones/Joint/Cartilage Impacted mildly displaced subcapital femoral neck fracture. Mild comminution and apex anterior angulation. Acetabulum and pubic rami are intact. Minor hip joint osteoarthritis. Ligaments Suboptimally assessed by CT. Muscles and Tendons No evidence of intramuscular hematoma. Soft tissues No focal soft tissue hematoma. IMPRESSION: Impacted mildly displaced subcapital femoral neck fracture. Electronically Signed   By: Keith Rake M.D.   On: 09/04/2019 22:59   DG Knee Complete 4 Views Left  Result Date: 09/04/2019 CLINICAL DATA:  Recent fall with left knee pain, initial encounter EXAM: LEFT KNEE - COMPLETE 4+ VIEW COMPARISON:  None. FINDINGS: Degenerative changes are noted within the  lateral and patellofemoral joint spaces. No joint effusion is seen. No acute fracture or dislocation is noted. IMPRESSION: Degenerative change without acute abnormality. Electronically Signed   By: Inez Catalina M.D.   On: 09/04/2019 21:58   DG Hip Unilat With Pelvis 2-3 Views Left  Result Date: 09/04/2019 CLINICAL DATA:  Recent fall with hip pain, initial encounter EXAM: DG HIP (WITH OR WITHOUT PELVIS) 3V LEFT COMPARISON:  None. FINDINGS: Pelvic ring is intact. Degenerative changes of lower lumbar spine are noted. Right hip shows mild degenerative change. The femur somewhat rotated although no definitive fracture is seen. No soft tissue abnormality is noted. IMPRESSION: No definitive fracture is noted. Electronically Signed   By: Inez Catalina M.D.   On: 09/04/2019 21:57    EKG: Independently reviewed. Sinus or ectopic atrial rhythm, 1st degree AV block.   Assessment/Plan   1. Left hip fracture  - Presents with left hip pain after a fall and is found to have femoral neck fracture  - Orthopedic surgery consulting and much appreciated  - Based on the available data, Mrs. Areola presents an estimated 1.7% risk of perioperative MI or cardiac arrest  - Hold Eliquis (last dose 9:30 am on 7/28), delay surgery 24-36 hours from last Eliquis dose, hold ARB, continue pain-control and supportive care   2. Paroxysmal atrial fibrillation  - Patient reports last dose of Eliquis was 09:30 am on 09/04/19  - Hold Eliquis preoperatively    3. Type II DM  - A1c was normal recently and she was going to be given a trial off of glipizide  - Check CBGs and use a low-intensity SSI with Novolog if needed    4. CKD IIIb - SCr is 1.21 on admission, similar to priors  - Renally-dose medications, monitor    5. OSA  - Continue CPAP qHS   6. Hypertension  - BP at goal, hold losartan preoperatively     DVT prophylaxis: Eliquis pta, held on admission  Code Status: Full  Family Communication: Discussed with patient   Disposition Plan:  Patient is from: Home  Anticipated d/c is to: TBD Anticipated d/c date is: 09/08/19 Patient currently: Pending surgery consultation and likely hip repair  Consults called: Orthopedic surgery consulted by ED physician  Admission status: Inpatient     Vianne Bulls, MD [facility]  09/05/2019, 1:17 AM

## 2019-09-05 NOTE — Op Note (Signed)
09/05/2019  4:57 PM  Patient:   Krystal Goodwin  Pre-Op Diagnosis:   Impacted/displaced femoral neck fracture, left hip.  Post-Op Diagnosis:   Same.  Procedure:   Left hip unipolar hemiarthroplasty.  Surgeon:   Pascal Lux, MD  Assistant:   Cameron Proud, PA-C; Gabrielle Dare, PA-S  Anesthesia:   GET  Findings:   As above.  Complications:   None  EBL:   400 cc  Fluids:   1000 cc crystalloid  UOP:   None  TT:   None  Drains:   None  Closure:   Staples  Implants:   Biomet press-fit system with a #10 laterally offset reduced proximal profile Echo femoral stem, a 47 mm outer diameter shell, and a +0 mm neck adapter.  Brief Clinical Note:   The patient is an 86 year old female who sustained the above-noted injury last evening when she apparently lost her balance and fell while in her kitchen. She was brought to the emergency room where x-rays demonstrated the above-noted injury. The patient has been cleared medically and presents at this time for definitive management of the injury.  Procedure:   The patient was brought into the operating room. After adequate general endotracheal intubation and anesthesia was obtained, the patient was repositioned in the right lateral decubitus position and secured using a lateral hip positioner. The left hip and lower extremity were prepped with ChloroPrep solution before being draped sterilely. Preoperative antibiotics were administered. A timeout was performed to verify the appropriate surgical site before a standard posterior approach to the hip was made through an approximately 4-5 inch incision. The incision was carried down through the subcutaneous tissues to expose the gluteal fascia and proximal end of the iliotibial band. These structures were split the length of the incision and the Charnley self-retaining hip retractor placed. The bursal tissues were swept posteriorly to expose the short external rotators. The anterior border of the  piriformis tendon was identified and this plane developed down through the capsule to enter the joint. Abundant fracture hematoma was suctioned. A flap of tissue was elevated off the posterior aspect of the femoral neck and greater trochanter and retracted posteriorly. This flap included the piriformis tendon, the short external rotators, and the posterior capsule. The femoral head was removed in its entirety, then taken to the back table where it was measured and found to be optimally replicated by a 47 mm head. The appropriate trial head was inserted and found to demonstrate an excellent suction fit.   Attention was directed to the femoral side. The femoral neck was recut 10-12 mm above the lesser trochanter using an oscillating saw. The piriformis fossa was debrided of soft tissues before the intramedullary canal was accessed through this point using a triple step reamer. The canal was reamed sequentially beginning with a #7 tapered reamer and progressing to a #10 tapered reamer. This provided excellent circumferential chatter. A box osteotome was used to establish version before the canal was broached sequentially beginning with a #7 broach and progressing to a #10 broach. This was left in place and several trial reductions performed. The permanent #10 reduced proximal profile femoral stem was impacted into place. A repeat trial reduction was performed using both the -3 mm and +0 mm neck lengths. The +0 mm neck length demonstrated excellent stability both in extension and external rotation as well as with flexion to 90 and internal rotation beyond 70. It also was stable in the position of sleep. The 47 mm  outer diameter shell with the +0 mm neck adapter construct was put together on the back table before being impacted onto the stem of the femoral component. The Morse taper locking mechanism was verified using manual distraction before the head was relocated and the hip placed through a range of motion with  the findings as described above.  The wound was copiously irrigated with bacitracin saline solution via the jet lavage system before the peri-incisional and pericapsular tissues were injected with 30 cc of 0.5% Sensorcaine with epinephrine and 20 cc of Exparel diluted out to 60 cc with normal saline to help with postoperative analgesia. The posterior flap was reapproximated to the posterior aspect of the greater trochanter using #2 Tycron interrupted sutures placed through drill holes. The iliotibial band was reapproximated using #1 Vicryl interrupted sutures before the gluteal fascia was closed using a running #1 Vicryl suture. At this point, 1 g of transexemic acid in 10 cc of normal saline was injected into the joint to help reduce postoperative bleeding. The subcutaneous tissues were closed in several layers using 2-0 Vicryl interrupted sutures before the skin was closed using staples. A sterile occlusive dressing was applied to the wound . The patient then was rolled back into the supine position on the hospital bed before being awakened, extubated, and returned to the recovery room in satisfactory condition after tolerating the procedure well.

## 2019-09-05 NOTE — Transfer of Care (Signed)
Immediate Anesthesia Transfer of Care Note  Patient: MCKENZY SALAZAR  Procedure(s) Performed: ARTHROPLASTY BIPOLAR HIP (HEMIARTHROPLASTY) (Left Hip)  Patient Location: PACU  Anesthesia Type:General  Level of Consciousness: drowsy  Airway & Oxygen Therapy: Patient Spontanous Breathing and Patient connected to nasal cannula oxygen  Post-op Assessment: Report given to RN  Post vital signs: stable  Last Vitals:  Vitals Value Taken Time  BP 98/38 09/05/19 1655  Temp    Pulse 96 09/05/19 1656  Resp 15 09/05/19 1656  SpO2 84 % 09/05/19 1656  Vitals shown include unvalidated device data.  Last Pain:  Vitals:   09/05/19 1400  TempSrc: Temporal  PainSc: 8          Complications: No complications documented.

## 2019-09-05 NOTE — Anesthesia Postprocedure Evaluation (Signed)
Anesthesia Post Note  Patient: Krystal Goodwin  Procedure(s) Performed: ARTHROPLASTY BIPOLAR HIP (HEMIARTHROPLASTY) (Left Hip)  Patient location during evaluation: PACU Anesthesia Type: General Level of consciousness: awake and alert Pain management: pain level controlled Vital Signs Assessment: post-procedure vital signs reviewed and stable Respiratory status: spontaneous breathing and respiratory function stable Cardiovascular status: stable Anesthetic complications: no   No complications documented.   Last Vitals:  Vitals:   09/05/19 1400 09/05/19 1401  BP:  (!) 114/46  Pulse: 79   Resp: 16   Temp: 36.8 C   SpO2: 100%     Last Pain:  Vitals:   09/05/19 1400  TempSrc: Temporal  PainSc: 8                  Starleen Trussell K

## 2019-09-05 NOTE — Consult Note (Signed)
[facility] Cardiology Consultation Note  Patient ID: Krystal Goodwin, MRN: [id number], DOB/AGE: 84/31/1935 84 y.o. Admit date: 09/04/2019   Date of Consult: 09/05/2019 Primary Physician: Derinda Late, MD Primary Cardiologist: Nehemiah Massed  Chief Complaint:  Chief Complaint  Patient presents with  . Fall   Reason for Consult: Atrial fibrillation hypertension hyperlipidemia with fracture  HPI: 86 y.o. female with known paroxysmal nonvalvular atrial fibrillation status post sick sinus syndrome and pacemaker placement maintaining normal sinus rhythm with metoprolol with diabetes hypertension and hyperlipidemia on appropriate medical management.  Patient has had multiple episodes where she is lost her balance and fallen.  She has not had any episodes of apparent syncope.  With this recent fall she has fractured her hip which may need treatment including surgical intervention.  After falling she has had pain but no evidence of anginal symptoms congestive heart failure or acute coronary syndrome.  EKG has shown normal sinus rhythm with left axis deviation and poor R wave progression.  Previous evaluation of pacemaker has shown no evidence of significant abnormalities in the office.  Currently she is hemodynamically stable and feeling better  Past Medical History:  Diagnosis Date  . Anemia    iron def.anemia  . Aortic stenosis, severe   . Asthma   . Chest pain   . Colon cancer ([place])   . Diabetes mellitus without complication ([place])   . Dyslipidemia   . Dyspnea   . Gout, joint   . H/O postmenopausal osteoporosis   . Hematuria   . History of palpitations   . Hx of colonic polyps   . Hypertension   . Hypothyroidism   . IBS (irritable bowel syndrome)   . Schatzki's ring   . Sleep apnea       Surgical History:  Past Surgical History:  Procedure Laterality Date  . AORTIC VALVE REPLACEMENT    . CARDIAC PACEMAKER PLACEMENT    . CHOLECYSTECTOMY    . COLON SURGERY  1997   RIGHT  HEMICOLECTOMY  . COLONOSCOPY    . COLONOSCOPY WITH PROPOFOL N/A 09/07/2016   Procedure: COLONOSCOPY WITH PROPOFOL;  Surgeon: Manya Silvas, MD;  Location: [facility] ENDOSCOPY;  Service: Endoscopy;  Laterality: N/A;  . ESOPHAGOGASTRODUODENOSCOPY    . TEE WITHOUT CARDIOVERSION  03/08/2013     Home Meds: Prior to Admission medications   Medication Sig Start Date End Date Taking? Authorizing Provider  ALPRAZolam (XANAX) 0.25 MG tablet Take 0.25-0.5 mg by mouth every 4 (four) hours as needed for anxiety or sleep.   Yes [provider]  atorvastatin (LIPITOR) 10 MG tablet Take 10 mg by mouth daily. 06/20/19  Yes [provider]  citalopram (CELEXA) 20 MG tablet Take 20 mg by mouth daily. 08/21/19  Yes [provider]  ELIQUIS 5 MG TABS tablet Take 5 mg by mouth 2 (two) times daily. 08/21/19  Yes [provider]  ferrous sulfate 325 (65 FE) MG tablet Take 325 mg by mouth daily with breakfast.   Yes [provider]  furosemide (LASIX) 40 MG tablet Take 40 mg by mouth.   Yes [provider]  glipiZIDE (GLUCOTROL XL) 5 MG 24 hr tablet Take 5 mg by mouth daily with breakfast.    Yes [provider]  levothyroxine (SYNTHROID) 100 MCG tablet Take 100 mcg by mouth daily before breakfast.    Yes [provider]  losartan (COZAAR) 100 MG tablet Take 100 mg by mouth daily.   Yes [provider]  Melatonin-Pyridoxine 1-10  MG TABS Take by mouth at bedtime as needed.   Yes [provider]  metoprolol succinate (TOPROL-XL) 25 MG 24 hr tablet Take 25 mg by mouth daily. 06/20/19  Yes [provider]  Multiple Vitamin (MULTIVITAMIN) tablet Take 1 tablet by mouth daily.   Yes [provider]  pantoprazole (PROTONIX) 40 MG tablet Take 40 mg by mouth daily.   Yes [provider]    Inpatient Medications:  . atorvastatin  10 mg Oral QPM  . citalopram  20 mg Oral Daily  . [START ON 09/06/2019] ferrous sulfate   325 mg Oral Q breakfast  . insulin aspart  0-9 Units Subcutaneous Q4H  . [START ON 09/06/2019] levothyroxine  100 mcg Oral Q0600  . metoprolol succinate  25 mg Oral Daily  . multivitamin with minerals  1 tablet Oral Daily  . pantoprazole  40 mg Oral Daily   . sodium chloride 50 mL/hr at 09/05/19 0524  .  ceFAZolin (ANCEF) IV      Allergies:  Allergies  Allergen Reactions  . Amoxicillin Itching    Breaks out in a rash  . Erythromycin Nausea And Vomiting  . Vicodin [Hydrocodone-Acetaminophen] Nausea And Vomiting    Social History   Socioeconomic History  . Marital status: Widowed    Spouse name: Not on file  . Number of children: Not on file  . Years of education: Not on file  . Highest education level: Not on file  Occupational History  . Not on file  Tobacco Use  . Smoking status: Former Smoker    Packs/day: 1.00    Years: 20.00    Pack years: 20.00    Types: Cigarettes    Quit date: 02/21/1989    Years since quitting: 30.5  . Smokeless tobacco: Never Used  Vaping Use  . Vaping Use: Never used  Substance and Sexual Activity  . Alcohol use: No  . Drug use: No  . Sexual activity: Not on file  Other Topics Concern  . Not on file  Social History Narrative  . Not on file   Social Determinants of Health   Financial Resource Strain:   . Difficulty of Paying Living Expenses:   Food Insecurity:   . Worried About Charity fundraiser in the Last Year:   . Arboriculturist in the Last Year:   Transportation Needs:   . Film/video editor (Medical):   Marland Kitchen Lack of Transportation (Non-Medical):   Physical Activity:   . Days of Exercise per Week:   . Minutes of Exercise per Session:   Stress:   . Feeling of Stress :   Social Connections:   . Frequency of Communication with Friends and Family:   . Frequency of Social Gatherings with Friends and Family:   . Attends Religious Services:   . Active Member of Clubs or Organizations:   . Attends Archivist  Meetings:   Marland Kitchen Marital Status:   Intimate Partner Violence:   . Fear of Current or Ex-Partner:   . Emotionally Abused:   Marland Kitchen Physically Abused:   . Sexually Abused:      Family History  Problem Relation Age of Onset  . Breast cancer Neg Hx      Review of Systems Positive for limb pain Negative for: General:  chills, fever, night sweats or weight changes.  Cardiovascular: PND orthopnea syncope dizziness  Dermatological skin lesions rashes Respiratory: Cough congestion Urologic: Frequent urination urination at night and hematuria Abdominal: negative  for nausea, vomiting, diarrhea, bright red blood per rectum, melena, or hematemesis Neurologic: negative for visual changes, and/or hearing changes  All other systems reviewed and are otherwise negative except as noted above.  Labs: No results for input(s): CKTOTAL, CKMB, TROPONINI in the last 72 hours. Lab Results  Component Value Date   WBC 12.7 (H) 09/05/2019   HGB 10.7 (L) 09/05/2019   HCT 34.1 (L) 09/05/2019   MCV 86.8 09/05/2019   PLT 203 09/05/2019    Recent Labs  Lab 09/05/19 0353  NA 139  K 4.0  CL 104  CO2 26  BUN 35*  CREATININE 1.29*  CALCIUM 8.9  GLUCOSE 141*   No results found for: CHOL, HDL, LDLCALC, TRIG No results found for: DDIMER  Radiology/Studies:  DG Chest 1 View  Result Date: 09/04/2019 CLINICAL DATA:  Recent fall with left hip pain, initial encounter EXAM: CHEST  1 VIEW COMPARISON:  08/02/2013 FINDINGS: Cardiac shadow is within normal limits. Postsurgical changes are noted. Pacing device is again seen. The lungs are well aerated bilaterally. No focal infiltrate or sizable effusion is noted. No bony abnormality noted. IMPRESSION: No active disease. Electronically Signed   By: Inez Catalina M.D.   On: 09/04/2019 22:00   CT Head Wo Contrast  Result Date: 09/04/2019 CLINICAL DATA:  Fall at home EXAM: CT HEAD WITHOUT CONTRAST TECHNIQUE: Contiguous axial images were obtained from the base of the skull  through the vertex without intravenous contrast. COMPARISON:  None. FINDINGS: Brain: No evidence of acute territorial infarction, hemorrhage, hydrocephalus,extra-axial collection or mass lesion/mass effect. There is dilatation the ventricles and sulci consistent with age-related atrophy. Low-attenuation changes in the deep white matter consistent with small vessel ischemia. Vascular: No hyperdense vessel or unexpected calcification. Skull: The skull is intact. No fracture or focal lesion identified. Sinuses/Orbits: The visualized paranasal sinuses and mastoid air cells are clear. The orbits and globes intact. Other: None Cervical spine: Alignment: There is straightening of the normal cervical lordosis. Skull base and vertebrae: Visualized skull base is intact. No atlanto-occipital dissociation. The vertebral body heights are well maintained. No fracture or pathologic osseous lesion seen. Soft tissues and spinal canal: The visualized paraspinal soft tissues are unremarkable. No prevertebral soft tissue swelling is seen. The spinal canal is grossly unremarkable, no large epidural collection or significant canal narrowing. Disc levels: Multilevel cervical spine spondylosis is seen with disc height loss disc osteophyte complex and uncovertebral osteophytes most C5-C6 C6-C7 mild neural foraminal narrowing and central stenosis. Upper chest: The lung apices are clear. Thoracic inlet is within normal limits. Scattered carotid artery calcifications are seen. Other: None IMPRESSION: No acute intracranial abnormality. Findings consistent with age related atrophy and chronic small vessel ischemia No acute fracture or malalignment of the spine. Electronically Signed   By: Prudencio Pair M.D.   On: 09/04/2019 23:02   CT Cervical Spine Wo Contrast  Result Date: 09/04/2019 CLINICAL DATA:  Fall at home EXAM: CT HEAD WITHOUT CONTRAST TECHNIQUE: Contiguous axial images were obtained from the base of the skull through the vertex  without intravenous contrast. COMPARISON:  None. FINDINGS: Brain: No evidence of acute territorial infarction, hemorrhage, hydrocephalus,extra-axial collection or mass lesion/mass effect. There is dilatation the ventricles and sulci consistent with age-related atrophy. Low-attenuation changes in the deep white matter consistent with small vessel ischemia. Vascular: No hyperdense vessel or unexpected calcification. Skull: The skull is intact. No fracture or focal lesion identified. Sinuses/Orbits: The visualized paranasal sinuses and mastoid air cells are clear. The orbits and globes  intact. Other: None Cervical spine: Alignment: There is straightening of the normal cervical lordosis. Skull base and vertebrae: Visualized skull base is intact. No atlanto-occipital dissociation. The vertebral body heights are well maintained. No fracture or pathologic osseous lesion seen. Soft tissues and spinal canal: The visualized paraspinal soft tissues are unremarkable. No prevertebral soft tissue swelling is seen. The spinal canal is grossly unremarkable, no large epidural collection or significant canal narrowing. Disc levels: Multilevel cervical spine spondylosis is seen with disc height loss disc osteophyte complex and uncovertebral osteophytes most C5-C6 C6-C7 mild neural foraminal narrowing and central stenosis. Upper chest: The lung apices are clear. Thoracic inlet is within normal limits. Scattered carotid artery calcifications are seen. Other: None IMPRESSION: No acute intracranial abnormality. Findings consistent with age related atrophy and chronic small vessel ischemia No acute fracture or malalignment of the spine. Electronically Signed   By: Prudencio Pair M.D.   On: 09/04/2019 23:02   CT Hip Left Wo Contrast  Result Date: 09/04/2019 CLINICAL DATA:  Left hip pain after fall at home in the kitchen. EXAM: CT OF THE LEFT HIP WITHOUT CONTRAST TECHNIQUE: Multidetector CT imaging of the left hip was performed according  to the standard protocol. Multiplanar CT image reconstructions were also generated. COMPARISON:  Radiograph earlier this day. FINDINGS: Bones/Joint/Cartilage Impacted mildly displaced subcapital femoral neck fracture. Mild comminution and apex anterior angulation. Acetabulum and pubic rami are intact. Minor hip joint osteoarthritis. Ligaments Suboptimally assessed by CT. Muscles and Tendons No evidence of intramuscular hematoma. Soft tissues No focal soft tissue hematoma. IMPRESSION: Impacted mildly displaced subcapital femoral neck fracture. Electronically Signed   By: Keith Rake M.D.   On: 09/04/2019 22:59   DG Knee Complete 4 Views Left  Result Date: 09/04/2019 CLINICAL DATA:  Recent fall with left knee pain, initial encounter EXAM: LEFT KNEE - COMPLETE 4+ VIEW COMPARISON:  None. FINDINGS: Degenerative changes are noted within the lateral and patellofemoral joint spaces. No joint effusion is seen. No acute fracture or dislocation is noted. IMPRESSION: Degenerative change without acute abnormality. Electronically Signed   By: Inez Catalina M.D.   On: 09/04/2019 21:58   DG Hip Unilat With Pelvis 2-3 Views Left  Result Date: 09/04/2019 CLINICAL DATA:  Recent fall with hip pain, initial encounter EXAM: DG HIP (WITH OR WITHOUT PELVIS) 3V LEFT COMPARISON:  None. FINDINGS: Pelvic ring is intact. Degenerative changes of lower lumbar spine are noted. Right hip shows mild degenerative change. The femur somewhat rotated although no definitive fracture is seen. No soft tissue abnormality is noted. IMPRESSION: No definitive fracture is noted. Electronically Signed   By: Inez Catalina M.D.   On: 09/04/2019 21:57    EKG: Normal sinus rhythm left axis deviation poor R wave progression  Weights: Filed Weights   09/04/19 2055  Weight: 90.7 kg     Physical Exam: Blood pressure (!) 113/43, pulse 78, temperature 98.7 F (37.1 C), temperature source Oral, resp. rate 17, height 5\' 5"  (1.651 m), weight 90.7 kg,  SpO2 100 %. Body mass index is 33.28 kg/m. General: Well developed, well nourished, in no acute distress. Head eyes ears nose throat: Normocephalic, atraumatic, sclera non-icteric, no xanthomas, nares are without discharge. No apparent thyromegaly and/or mass  Lungs: Normal respiratory effort.  Few wheezes, no rales, no rhonchi.  Heart: RRR with normal S1 S2. no murmur gallop, no rub, PMI is normal size and placement, carotid upstroke normal without bruit, jugular venous pressure is normal Abdomen: Soft, non-tender, non-distended with normoactive bowel sounds.  No hepatomegaly. No rebound/guarding. No obvious abdominal masses. Abdominal aorta is normal size without bruit Extremities: Trace edema. no cyanosis, no clubbing, no ulcers  Peripheral : 2+ bilateral upper extremity pulses, 2+ bilateral femoral pulses, 2+ bilateral dorsal pedal pulse Neuro: Alert and oriented. No facial asymmetry. No focal deficit. Moves all extremities spontaneously. Musculoskeletal: Normal muscle tone without kyphosis Psych:  Responds to questions appropriately with a normal affect.    Assessment: 86 year old female with hypertension hyperlipidemia diabetes chronic kidney disease paroxysmal nonvalvular atrial fibrillation maintaining normal sinus rhythm status post pacemaker placement with a fall and hip fracture without evidence of acute coronary syndrome anginal symptoms or congestive heart failure at lowest risk possible for cardiovascular complication with orthopedic surgery at this time  Plan: 1.  Proceed to surgery without restriction due to no current evidence of clinical angina syncope congestive heart failure or acute coronary syndrome 2.  Abstain from Eliquis at this time due to maintenance of normal sinus rhythm and need for surgery to lower bleeding complications.  Reinstatement of medication management when bleeding risk is low 3.  No further cardiac diagnostics necessary at this time due to no evidence of  cardiac symptoms or issues today 4.  Continue diabetes hypertension hyperlipidemia treatment with previous medication management without change at this time 5.  Proceed to orthopedic surgery rehabilitation without restriction  Signed, Corey Skains M.D. [facility] 09/05/2019, 12:41 PM

## 2019-09-05 NOTE — ED Notes (Signed)
Pt calls out asking for "something to relax her. I'm shaking and I'm not cold it just won't stop because my nerves are riled up". MD Jessup notified, see new orders.

## 2019-09-05 NOTE — Anesthesia Preprocedure Evaluation (Addendum)
Anesthesia Evaluation  Patient identified by MRN, date of birth, ID band Patient awake    Reviewed: Allergy & Precautions, H&P , NPO status , reviewed documented beta blocker date and time   History of Anesthesia Complications (+) history of anesthetic complications  Airway Mallampati: III  TM Distance: >3 FB Neck ROM: limited    Dental  (+) Partial Lower, Chipped, Missing   Pulmonary shortness of breath, asthma , sleep apnea , former smoker,    Pulmonary exam normal        Cardiovascular hypertension, Normal cardiovascular exam  07/2018 ECHO INTERPRETATION  NORMAL LEFT VENTRICULAR SYSTOLIC FUNCTION  NORMAL RIGHT VENTRICULAR SYSTOLIC FUNCTION  MODERATE VALVULAR REGURGITATION  MILD VALVULAR STENOSIS NORMAL FUNCTIONING AORTIC BIOPROSTESIS  MILD LA ENLARGEMENT  MILD LVH  PACER WIRE NOTED     Neuro/Psych    GI/Hepatic neg GERD  ,  Endo/Other  diabetesHypothyroidism   Renal/GU Renal disease     Musculoskeletal  (+) Arthritis ,   Abdominal   Peds  Hematology  (+) Blood dyscrasia, anemia ,   Anesthesia Other Findings Past Medical History: No date: Anemia     Comment:  iron def.anemia No date: Aortic stenosis, severe No date: Asthma No date: Chest pain No date: Colon cancer ([place]) No date: Diabetes mellitus without complication ([facility]) No date: Dyslipidemia No date: Dyspnea No date: Gout, joint No date: H/O postmenopausal osteoporosis No date: Hematuria No date: History of palpitations No date: Hx of colonic polyps No date: Hypertension No date: Hypothyroidism No date: IBS (irritable bowel syndrome) No date: Schatzki's ring No date: Sleep apnea  Past Surgical History: No date: AORTIC VALVE REPLACEMENT No date: CARDIAC PACEMAKER PLACEMENT No date: CHOLECYSTECTOMY 1997: COLON SURGERY     Comment:  RIGHT HEMICOLECTOMY No date: COLONOSCOPY 09/07/2016: COLONOSCOPY WITH PROPOFOL; N/A     Comment:   Procedure: COLONOSCOPY WITH PROPOFOL;  Surgeon: Manya Silvas, MD;  Location: [facility] ENDOSCOPY;  Service:               Endoscopy;  Laterality: N/A; No date: ESOPHAGOGASTRODUODENOSCOPY 03/08/2013: TEE WITHOUT CARDIOVERSION  BMI    Body Mass Index: 33.28 kg/m      Reproductive/Obstetrics                           Anesthesia Physical Anesthesia Plan  ASA: III  Anesthesia Plan: General   Post-op Pain Management:    Induction: Intravenous  PONV Risk Score and Plan: 2 and Treatment may vary due to age or medical condition, Ondansetron and Propofol infusion  Airway Management Planned: LMA and Oral ETT  Additional Equipment:   Intra-op Plan:   Post-operative Plan: Extubation in OR  Informed Consent: I have reviewed the patients History and Physical, chart, labs and discussed the procedure including the risks, benefits and alternatives for the proposed anesthesia with the patient or authorized representative who has indicated his/her understanding and acceptance.     Dental Advisory Given  Plan Discussed with: CRNA  Anesthesia Plan Comments: (Pt received Eliquis 24 hrs ago, unable to place spinal. GA discussed w daughter and pt.)      Anesthesia Quick Evaluation

## 2019-09-05 NOTE — Progress Notes (Signed)
Initial Nutrition Assessment  DOCUMENTATION CODES:   Not applicable  INTERVENTION:  Once diet is advanced provide Ensure Max Protein po once daily, each supplement provides 150 kcal and 30 grams of protein.  Continue daily MVI.  NUTRITION DIAGNOSIS:   Increased nutrient needs related to post-op healing as evidenced by estimated needs.  GOAL:   Patient will meet greater than or equal to 90% of their needs  MONITOR:   PO intake, Supplement acceptance, Diet advancement, Labs, Weight trends, Skin, I & O's  REASON FOR ASSESSMENT:   Consult Hip fracture protocol  ASSESSMENT:   86 year old female with PMHx of asthma, DM, gout, HTN, hypothyroidism, IBS, Schatzki's ring, sleep apnea, hx aortic valve replacement, hx cardiac pacemaker placement admitted after a fall with impacted/displaced left femoral neck fracture.   Met with patient and her daughter at bedside. Patient reports her appetite has been decreased for 2 months now but she is unsure why. She has only been eating about 50% of her meals for the past 2 months. She reports she has been drinking Atkins protein shakes at home as they are lower in sugar but cannot remember which one it is (appears there is one that has 15 grams of protein and one that has 23 grams of protein). Discussed increased nutrient needs for wound healing post-operatively. Patient is amenable to drinking Ensure Max Protein once diet is advanced.  Patient reports her UBW was 200 lbs and that she has lost approximately 10 lbs over the last 2 months and is now down to 190 lbs. Bed scale is not working so RD was unable to weigh patient. She is currently documented to be exactly 90.7 kg (200 lbs), but this is the same exact weight that has been entered at every encounter since 2018.   Medications reviewed and include: ferrous sulfate 325 mg daily, Novolog 0-9 units Q4hrs, levothyroxine, MVI daily, Protonix, 1/2NS at 50 mL/hr.  Labs reviewed: CBG 100-127, BUN 35,  Creatinine 1.29.  Patient does not meet criteria for malnutrition at this time.  NUTRITION - FOCUSED PHYSICAL EXAM:    Most Recent Value  Orbital Region No depletion  Upper Arm Region No depletion  Thoracic and Lumbar Region No depletion  Buccal Region No depletion  Temple Region Mild depletion  Clavicle Bone Region No depletion  Clavicle and Acromion Bone Region No depletion  Scapular Bone Region No depletion  Dorsal Hand Mild depletion  Patellar Region No depletion  Anterior Thigh Region No depletion  Posterior Calf Region No depletion  Edema (RD Assessment) None  Hair Reviewed  Eyes Reviewed  Mouth Reviewed  Skin Reviewed  Nails Reviewed     Diet Order:   Diet Order            Diet NPO time specified Except for: Sips with Meds  Diet effective now                EDUCATION NEEDS:   No education needs have been identified at this time  Skin:  Skin Assessment: Reviewed RN Assessment  Last BM:  Unknown  Height:   Ht Readings from Last 1 Encounters:  09/04/19 '5\' 5"'  (1.651 m)   Weight:   Wt Readings from Last 1 Encounters:  09/04/19 90.7 kg   BMI:  Body mass index is 33.28 kg/m.  Estimated Nutritional Needs:   Kcal:  2000-2200  Protein:  100-110 grams  Fluid:  2-2.2 L/day  Jacklynn Barnacle, MS, RD, LDN Pager number available on Amion

## 2019-09-05 NOTE — ED Notes (Signed)
Attending MD at bedside.

## 2019-09-06 ENCOUNTER — Encounter: Payer: Self-pay | Admitting: Surgery

## 2019-09-06 DIAGNOSIS — Z96649 Presence of unspecified artificial hip joint: Secondary | ICD-10-CM | POA: Insufficient documentation

## 2019-09-06 DIAGNOSIS — S72002A Fracture of unspecified part of neck of left femur, initial encounter for closed fracture: Secondary | ICD-10-CM | POA: Insufficient documentation

## 2019-09-06 LAB — CBC
HCT: 29.3 % — ABNORMAL LOW (ref 36.0–46.0)
Hemoglobin: 8.7 g/dL — ABNORMAL LOW (ref 12.0–15.0)
MCH: 26.7 pg (ref 26.0–34.0)
MCHC: 29.7 g/dL — ABNORMAL LOW (ref 30.0–36.0)
MCV: 89.9 fL (ref 80.0–100.0)
Platelets: 182 10*3/uL (ref 150–400)
RBC: 3.26 MIL/uL — ABNORMAL LOW (ref 3.87–5.11)
RDW: 14.8 % (ref 11.5–15.5)
WBC: 11.8 10*3/uL — ABNORMAL HIGH (ref 4.0–10.5)
nRBC: 0 % (ref 0.0–0.2)

## 2019-09-06 LAB — BASIC METABOLIC PANEL
Anion gap: 8 (ref 5–15)
BUN: 27 mg/dL — ABNORMAL HIGH (ref 8–23)
CO2: 22 mmol/L (ref 22–32)
Calcium: 7.9 mg/dL — ABNORMAL LOW (ref 8.9–10.3)
Chloride: 106 mmol/L (ref 98–111)
Creatinine, Ser: 1.41 mg/dL — ABNORMAL HIGH (ref 0.44–1.00)
GFR calc Af Amer: 40 mL/min — ABNORMAL LOW (ref >60)
GFR calc non Af Amer: 34 mL/min — ABNORMAL LOW (ref >60)
Glucose, Bld: 139 mg/dL — ABNORMAL HIGH (ref 70–99)
Potassium: 4.1 mmol/L (ref 3.5–5.1)
Sodium: 136 mmol/L (ref 135–145)

## 2019-09-06 LAB — GLUCOSE, CAPILLARY
Glucose-Capillary: 116 mg/dL — ABNORMAL HIGH (ref 70–99)
Glucose-Capillary: 129 mg/dL — ABNORMAL HIGH (ref 70–99)
Glucose-Capillary: 145 mg/dL — ABNORMAL HIGH (ref 70–99)
Glucose-Capillary: 156 mg/dL — ABNORMAL HIGH (ref 70–99)

## 2019-09-06 MED ORDER — TRAMADOL HCL 50 MG PO TABS: 50.0000 mg | ORAL_TABLET | Freq: Four times a day (QID) | ORAL | 0 refills | Status: DC | PRN

## 2019-09-06 MED ORDER — HYDROCODONE-ACETAMINOPHEN 5-325 MG PO TABS: 1.0000 | ORAL_TABLET | ORAL | 0 refills | Status: DC | PRN

## 2019-09-06 NOTE — Plan of Care (Signed)

## 2019-09-06 NOTE — Progress Notes (Signed)
Pt bladder scan 760. Sharion Settler made aware.

## 2019-09-06 NOTE — TOC Progression Note (Signed)
Transition of Care [facility]) - Progression Note    Patient Details  Name: Krystal Goodwin MRN: [id number] Date of Birth: [birth year]  [facility]) CM/SW [place], RN Phone Number: 09/06/2019, 10:16 AM  Clinical Narrative:    Met with the patient and her daughter Mitchell Heir in the room at the bedside to discuss DC plan and needs The patient resides at [facility] and will need to go to short term then transistion to assisted living, I explained the process of first going to short term then working with the social worker to transition to assisted The daughter asked that I call [facility] to see if they have a bed, They do have a bed but they are not in network with [employer], the patient ands her daughter agree to search for other beds to see what is available, FL2, PASSR and bedsearcjh completed, will review beds once obtained       Expected Discharge Plan and Services                                                 Social Determinants of Health (SDOH) Interventions    Readmission Risk Interventions No flowsheet data found.

## 2019-09-06 NOTE — Progress Notes (Signed)
Pt removed both IV accesses that she had. Dr Posey Pronto notifies via secure chat. Pt was assisted to wash and dress after an episode of urinary incontinence. Pt remains pleasantly confused. Pt is alert and oriented to self, place, time and situation.

## 2019-09-06 NOTE — Progress Notes (Signed)
Subjective: 1 Day Post-Op Procedure(s) (LRB): ARTHROPLASTY BIPOLAR HIP (HEMIARTHROPLASTY) (Left) Patient reports pain as mild to the left hip, reports moderate knee pain. Patient is well, and has had no acute complaints or problems PT and care management to assist with discharge planning. Negative for chest pain and shortness of breath Fever: no Gastrointestinal:Negative for nausea and vomiting  Objective: Vital signs in last 24 hours: Temp:  [97.8 F (36.6 C)-99.3 F (37.4 C)] 99 F (37.2 C) (07/30 0834) Pulse Rate:  [77-95] 92 (07/30 0834) Resp:  [15-21] 18 (07/30 0834) BP: (65-121)/(31-68) 100/50 (07/30 0834) SpO2:  [90 %-100 %] 90 % (07/30 0834)  Intake/Output from previous day:  Intake/Output Summary (Last 24 hours) at 09/06/2019 0843 Last data filed at 09/06/2019 0500 Gross per 24 hour  Intake 1100 ml  Output 1100 ml  Net 0 ml    Intake/Output this shift: No intake/output data recorded.  Labs: Recent Labs    09/04/19 2320 09/05/19 0353 09/06/19 0438  HGB 11.9* 10.7* 8.7*   Recent Labs    09/05/19 0353 09/06/19 0438  WBC 12.7* 11.8*  RBC 3.93 3.26*  HCT 34.1* 29.3*  PLT 203 182   Recent Labs    09/05/19 0353 09/06/19 0438  NA 139 136  K 4.0 4.1  CL 104 106  CO2 26 22  BUN 35* 27*  CREATININE 1.29* 1.41*  GLUCOSE 141* 139*  CALCIUM 8.9 7.9*   No results for input(s): LABPT, INR in the last 72 hours.   EXAM General - Patient is Alert, Appropriate and Oriented Extremity - ABD soft Neurovascular intact Sensation intact distally Intact pulses distally Dorsiflexion/Plantar flexion intact Incision: dressing C/D/I No cellulitis present Dressing/Incision - clean, dry, no drainage Motor Function - intact, moving foot and toes well on exam.  Abdomen is soft with palpation.  Past Medical History:  Diagnosis Date  . Anemia    iron def.anemia  . Aortic stenosis, severe   . Asthma   . Chest pain   . Colon cancer ([place])   . Complication of  anesthesia    nausea  . Diabetes mellitus without complication ([place])   . Dyslipidemia   . Dyspnea   . Gout, joint   . H/O postmenopausal osteoporosis   . Hematuria   . History of palpitations   . Hx of colonic polyps   . Hypertension   . Hypothyroidism   . IBS (irritable bowel syndrome)   . Schatzki's ring   . Sleep apnea     Assessment/Plan: 1 Day Post-Op Procedure(s) (LRB): ARTHROPLASTY BIPOLAR HIP (HEMIARTHROPLASTY) (Left) Principal Problem:   Closed fracture of left hip (HCC) Active Problems:   Diabetes mellitus type II, non insulin dependent (HCC)   Hypothyroidism   CKD (chronic kidney disease), stage III   Hypertension   Sleep apnea   PAF (paroxysmal atrial fibrillation) ([place])   Fall  Estimated body mass index is 33.28 kg/m as calculated from the following:   Height as of this encounter: 5\' 5"  (1.651 m).   Weight as of this encounter: 90.7 kg. Advance diet Up with therapy D/C IV fluids when tolerating oral intake.  Labs reviewed this AM. Begin working on BM at this time. Resume home dose of Eliquis at this time. Plan for discharge with Norco in addition to tramadol as needed for pain.  Upon discharge from hospital, if she discharges to SNF can have the rehab facility remove staples on 09/19/19 and follow-up with [facility] orthopaedics in 6 weeks. If discharge home follow-up in  the office in two weeks for staple removal.  DVT Prophylaxis - Foot Pumps, TED hose and Eliquis Weight-Bearing as tolerated to left leg  J. Cameron Proud, PA-C [facility] 09/06/2019, 8:43 AM

## 2019-09-06 NOTE — Progress Notes (Addendum)
VAST consulted to obtain IV access after pt removed IV. SecureChat sent to pt's nurse requesting to leave IV access out at this time; pt has no IVF's or scheduled IV meds. Awaiting response.  40: Spoke with pt's nurse regarding leaving IV access out for vein preservation and reducing risk of infection. Advised if circumstances change and pt needs IV access to place consult at that time if VAST needed.

## 2019-09-06 NOTE — NC FL2 (Signed)
[place] LEVEL OF CARE SCREENING TOOL     IDENTIFICATION  Patient Name: Krystal Goodwin Birthdate: [birth year] Sex: female Admission Date (Current Location): 09/04/2019  [place] and [place] Number:  Engineering geologist and Address:  [facility], [address], [place], [place] [address]      Provider Number: [id number]  Attending Physician Name and Address:  Fritzi Mandes, MD  Relative Name and Phone Number:  Mitchell Heir Daughter [phone]    Current Level of Care: Hospital Recommended Level of Care: [place] Prior Approval Number:    Date Approved/Denied:   PASRR Number: [id number]  Discharge Plan: SNF    Current Diagnoses: Patient Active Problem List   Diagnosis Date Noted  . Closed fracture of left hip ([place]) 09/05/2019  . Diabetes mellitus type II, non insulin dependent ([place]) 09/05/2019  . Hypothyroidism   . CKD (chronic kidney disease), stage III   . Hypertension   . Sleep apnea   . PAF (paroxysmal atrial fibrillation) ([place])   . Fall     Orientation RESPIRATION BLADDER Height & Weight     Self, Situation  Normal External catheter Weight: 90.7 kg Height:  5\' 5"  (165.1 cm)  BEHAVIORAL SYMPTOMS/MOOD NEUROLOGICAL BOWEL NUTRITION STATUS      Continent Diet (heart Healthy)  AMBULATORY STATUS COMMUNICATION OF NEEDS Skin   Extensive Assist Verbally Surgical wounds                       Personal Care Assistance Level of Assistance  Bathing, Dressing Bathing Assistance: Limited assistance   Dressing Assistance: Limited assistance     Functional Limitations Info  Hearing   Hearing Info: Impaired      SPECIAL CARE FACTORS FREQUENCY  PT (By licensed PT), OT (By licensed OT)     PT Frequency: 5 times per week OT Frequency: 5 times per week            Contractures Contractures Info: Not present    Additional Factors Info  Code Status, Allergies Code Status Info: full code Allergies Info:  Amoxicillin, Erythromycin, Vicodin           Current Medications (09/06/2019):  This is the current hospital active medication list Current Facility-Administered Medications  Medication Dose Route Frequency Provider Last Rate Last Admin  . acetaminophen (TYLENOL) tablet 325-650 mg  325-650 mg Oral Q6H PRN Poggi, Marshall Cork, MD      . acetaminophen (TYLENOL) tablet 500 mg  500 mg Oral Q6H Poggi, Marshall Cork, MD   500 mg at 09/06/19 0535  . ALPRAZolam (XANAX) tablet 0.25-0.5 mg  0.25-0.5 mg Oral Q4H PRN Poggi, Marshall Cork, MD      . apixaban Arne Cleveland) tablet 5 mg  5 mg Oral BID Poggi, Marshall Cork, MD   5 mg at 09/06/19 0911  . atorvastatin (LIPITOR) tablet 10 mg  10 mg Oral QPM Poggi, Marshall Cork, MD   10 mg at 09/05/19 2108  . bisacodyl (DULCOLAX) suppository 10 mg  10 mg Rectal Daily PRN Poggi, Marshall Cork, MD      . citalopram (CELEXA) tablet 20 mg  20 mg Oral Daily Poggi, Marshall Cork, MD   20 mg at 09/06/19 0912  . diphenhydrAMINE (BENADRYL) 12.5 MG/5ML elixir 12.5-25 mg  12.5-25 mg Oral Q4H PRN Poggi, Marshall Cork, MD      . docusate sodium (COLACE) capsule 100 mg  100 mg Oral BID Poggi, Marshall Cork, MD   100 mg at  09/06/19 0911  . ferrous sulfate tablet 325 mg  325 mg Oral Q breakfast Poggi, Marshall Cork, MD   325 mg at 09/06/19 0911  . HYDROcodone-acetaminophen (NORCO/VICODIN) 5-325 MG per tablet 1-2 tablet  1-2 tablet Oral Q4H PRN Poggi, Marshall Cork, MD   1 tablet at 09/06/19 [phone]  . insulin aspart (novoLOG) injection 0-9 Units  0-9 Units Subcutaneous Q4H Poggi, Marshall Cork, MD   1 Units at 09/05/19 2354  . levothyroxine (SYNTHROID) tablet 100 mcg  100 mcg Oral [id number] Corky Mull, MD   100 mcg at 09/06/19 0535  . magnesium hydroxide (MILK OF MAGNESIA) suspension 30 mL  30 mL Oral Daily PRN Poggi, Marshall Cork, MD      . metoCLOPramide (REGLAN) tablet 5-10 mg  5-10 mg Oral Q8H PRN Poggi, Marshall Cork, MD       Or  . metoCLOPramide (REGLAN) injection 5-10 mg  5-10 mg Intravenous Q8H PRN Poggi, Marshall Cork, MD      . metoprolol succinate (TOPROL-XL) 24 hr tablet  25 mg  25 mg Oral Daily Poggi, Marshall Cork, MD   25 mg at 09/06/19 0911  . morphine 2 MG/ML injection 0.5-1 mg  0.5-1 mg Intravenous Q4H PRN Poggi, Marshall Cork, MD      . multivitamin with minerals tablet 1 tablet  1 tablet Oral Daily Poggi, Marshall Cork, MD   1 tablet at 09/06/19 0911  . ondansetron (ZOFRAN) tablet 4 mg  4 mg Oral Q6H PRN Poggi, Marshall Cork, MD       Or  . ondansetron (ZOFRAN) injection 4 mg  4 mg Intravenous Q6H PRN Poggi, Marshall Cork, MD      . pantoprazole (PROTONIX) EC tablet 40 mg  40 mg Oral Daily Poggi, Marshall Cork, MD   40 mg at 09/06/19 0911  . phenol (CHLORASEPTIC) mouth spray 1 spray  1 spray Mouth/Throat PRN Poggi, Marshall Cork, MD      . protein supplement (ENSURE MAX) liquid  11 oz Oral Daily Poggi, Marshall Cork, MD   11 oz at 09/06/19 0917  . senna-docusate (Senokot-S) tablet 1 tablet  1 tablet Oral QHS PRN Poggi, Marshall Cork, MD      . sodium phosphate (FLEET) 7-19 GM/118ML enema 1 enema  1 enema Rectal Once PRN Poggi, Marshall Cork, MD      . traMADol Veatrice Bourbon) tablet 50 mg  50 mg Oral Q6H PRN Poggi, Marshall Cork, MD         Discharge Medications: Please see discharge summary for a list of discharge medications.  Relevant Imaging Results:  Relevant Lab Results:   Additional Information SS# [id number]  Su Hilt, RN

## 2019-09-06 NOTE — Progress Notes (Signed)
[facility] Encounter Note  Patient: Krystal Goodwin / Admit Date: 09/04/2019 / Date of Encounter: 09/06/2019, 8:26 AM   Subjective: Patient tolerated surgical intervention without any issues.  Overall patient feels much better than yesterday.  No evidence of concerns for episode of atrial fibrillation chest pain or congestive heart failure at this time.  Patient is hemodynamically stable on appropriate medications for risk reduction of cardiovascular event in rehab  Review of Systems: Positive for: Leg pain Negative for: Vision change, hearing change, syncope, dizziness, nausea, vomiting,diarrhea, bloody stool, stomach pain, cough, congestion, diaphoresis, urinary frequency, urinary pain,skin lesions, skin rashes Others previously listed  Objective: Telemetry: Normal sinus rhythm Physical Exam: Blood pressure (!) 121/41, pulse 84, temperature 98.1 F (36.7 C), temperature source Oral, resp. rate 18, height 5\' 5"  (1.651 m), weight 90.7 kg, SpO2 91 %. Body mass index is 33.28 kg/m. General: Well developed, well nourished, in no acute distress. Head: Normocephalic, atraumatic, sclera non-icteric, no xanthomas, nares are without discharge. Neck: No apparent masses Lungs: Normal respirations with few wheezes, no rhonchi, no rales , no crackles   Heart: Regular rate and rhythm, normal S1 S2, no murmur, no rub, no gallop, PMI is normal size and placement, carotid upstroke normal without bruit, jugular venous pressure normal Abdomen: Soft, non-tender, non-distended with normoactive bowel sounds. No hepatosplenomegaly. Abdominal aorta is normal size without bruit Extremities: Trace edema, no clubbing, no cyanosis, no ulcers,  Peripheral: 2+ radial, 2+ femoral, 2+ dorsal pedal pulses Neuro: Alert and oriented. Moves all extremities spontaneously. Psych:  Responds to questions appropriately with a normal affect.   Intake/Output Summary (Last 24 hours) at 09/06/2019 0826 Last  data filed at 09/06/2019 0500 Gross per 24 hour  Intake 1100 ml  Output 1100 ml  Net 0 ml    Inpatient Medications:   acetaminophen  500 mg Oral Q6H   apixaban  5 mg Oral BID   atorvastatin  10 mg Oral QPM   citalopram  20 mg Oral Daily   docusate sodium  100 mg Oral BID   ferrous sulfate  325 mg Oral Q breakfast   insulin aspart  0-9 Units Subcutaneous Q4H   levothyroxine  100 mcg Oral Q0600   metoprolol succinate  25 mg Oral Daily   multivitamin with minerals  1 tablet Oral Daily   pantoprazole  40 mg Oral Daily   Ensure Max Protein  11 oz Oral Daily   Infusions:    ceFAZolin (ANCEF) IV 2 g (09/06/19 0356)    Labs: Recent Labs    09/05/19 0353 09/06/19 0438  NA 139 136  K 4.0 4.1  CL 104 106  CO2 26 22  GLUCOSE 141* 139*  BUN 35* 27*  CREATININE 1.29* 1.41*  CALCIUM 8.9 7.9*   No results for input(s): AST, ALT, ALKPHOS, BILITOT, PROT, ALBUMIN in the last 72 hours. Recent Labs    09/04/19 2320 09/04/19 2320 09/05/19 0353 09/06/19 0438  WBC 15.4*   < > 12.7* 11.8*  NEUTROABS 11.8*  --   --   --   HGB 11.9*   < > 10.7* 8.7*  HCT 36.5   < > 34.1* 29.3*  MCV 85.3   < > 86.8 89.9  PLT 212   < > 203 182   < > = values in this interval not displayed.   No results for input(s): CKTOTAL, CKMB, TROPONINI in the last 72 hours. Invalid input(s): POCBNP Recent Labs    09/05/19 0353  HGBA1C 5.5  Weights: Filed Weights   09/04/19 2055  Weight: 90.7 kg     Radiology/Studies:  DG Chest 1 View  Result Date: 09/04/2019 CLINICAL DATA:  Recent fall with left hip pain, initial encounter EXAM: CHEST  1 VIEW COMPARISON:  08/02/2013 FINDINGS: Cardiac shadow is within normal limits. Postsurgical changes are noted. Pacing device is again seen. The lungs are well aerated bilaterally. No focal infiltrate or sizable effusion is noted. No bony abnormality noted. IMPRESSION: No active disease. Electronically Signed   By: Inez Catalina M.D.   On: 09/04/2019 22:00    CT Head Wo Contrast  Result Date: 09/04/2019 CLINICAL DATA:  Fall at home EXAM: CT HEAD WITHOUT CONTRAST TECHNIQUE: Contiguous axial images were obtained from the base of the skull through the vertex without intravenous contrast. COMPARISON:  None. FINDINGS: Brain: No evidence of acute territorial infarction, hemorrhage, hydrocephalus,extra-axial collection or mass lesion/mass effect. There is dilatation the ventricles and sulci consistent with age-related atrophy. Low-attenuation changes in the deep white matter consistent with small vessel ischemia. Vascular: No hyperdense vessel or unexpected calcification. Skull: The skull is intact. No fracture or focal lesion identified. Sinuses/Orbits: The visualized paranasal sinuses and mastoid air cells are clear. The orbits and globes intact. Other: None Cervical spine: Alignment: There is straightening of the normal cervical lordosis. Skull base and vertebrae: Visualized skull base is intact. No atlanto-occipital dissociation. The vertebral body heights are well maintained. No fracture or pathologic osseous lesion seen. Soft tissues and spinal canal: The visualized paraspinal soft tissues are unremarkable. No prevertebral soft tissue swelling is seen. The spinal canal is grossly unremarkable, no large epidural collection or significant canal narrowing. Disc levels: Multilevel cervical spine spondylosis is seen with disc height loss disc osteophyte complex and uncovertebral osteophytes most C5-C6 C6-C7 mild neural foraminal narrowing and central stenosis. Upper chest: The lung apices are clear. Thoracic inlet is within normal limits. Scattered carotid artery calcifications are seen. Other: None IMPRESSION: No acute intracranial abnormality. Findings consistent with age related atrophy and chronic small vessel ischemia No acute fracture or malalignment of the spine. Electronically Signed   By: Prudencio Pair M.D.   On: 09/04/2019 23:02   CT Cervical Spine Wo  Contrast  Result Date: 09/04/2019 CLINICAL DATA:  Fall at home EXAM: CT HEAD WITHOUT CONTRAST TECHNIQUE: Contiguous axial images were obtained from the base of the skull through the vertex without intravenous contrast. COMPARISON:  None. FINDINGS: Brain: No evidence of acute territorial infarction, hemorrhage, hydrocephalus,extra-axial collection or mass lesion/mass effect. There is dilatation the ventricles and sulci consistent with age-related atrophy. Low-attenuation changes in the deep white matter consistent with small vessel ischemia. Vascular: No hyperdense vessel or unexpected calcification. Skull: The skull is intact. No fracture or focal lesion identified. Sinuses/Orbits: The visualized paranasal sinuses and mastoid air cells are clear. The orbits and globes intact. Other: None Cervical spine: Alignment: There is straightening of the normal cervical lordosis. Skull base and vertebrae: Visualized skull base is intact. No atlanto-occipital dissociation. The vertebral body heights are well maintained. No fracture or pathologic osseous lesion seen. Soft tissues and spinal canal: The visualized paraspinal soft tissues are unremarkable. No prevertebral soft tissue swelling is seen. The spinal canal is grossly unremarkable, no large epidural collection or significant canal narrowing. Disc levels: Multilevel cervical spine spondylosis is seen with disc height loss disc osteophyte complex and uncovertebral osteophytes most C5-C6 C6-C7 mild neural foraminal narrowing and central stenosis. Upper chest: The lung apices are clear. Thoracic inlet is within normal limits. Scattered  carotid artery calcifications are seen. Other: None IMPRESSION: No acute intracranial abnormality. Findings consistent with age related atrophy and chronic small vessel ischemia No acute fracture or malalignment of the spine. Electronically Signed   By: Prudencio Pair M.D.   On: 09/04/2019 23:02   CT Hip Left Wo Contrast  Result Date:  09/04/2019 CLINICAL DATA:  Left hip pain after fall at home in the kitchen. EXAM: CT OF THE LEFT HIP WITHOUT CONTRAST TECHNIQUE: Multidetector CT imaging of the left hip was performed according to the standard protocol. Multiplanar CT image reconstructions were also generated. COMPARISON:  Radiograph earlier this day. FINDINGS: Bones/Joint/Cartilage Impacted mildly displaced subcapital femoral neck fracture. Mild comminution and apex anterior angulation. Acetabulum and pubic rami are intact. Minor hip joint osteoarthritis. Ligaments Suboptimally assessed by CT. Muscles and Tendons No evidence of intramuscular hematoma. Soft tissues No focal soft tissue hematoma. IMPRESSION: Impacted mildly displaced subcapital femoral neck fracture. Electronically Signed   By: Keith Rake M.D.   On: 09/04/2019 22:59   DG Knee Complete 4 Views Left  Result Date: 09/04/2019 CLINICAL DATA:  Recent fall with left knee pain, initial encounter EXAM: LEFT KNEE - COMPLETE 4+ VIEW COMPARISON:  None. FINDINGS: Degenerative changes are noted within the lateral and patellofemoral joint spaces. No joint effusion is seen. No acute fracture or dislocation is noted. IMPRESSION: Degenerative change without acute abnormality. Electronically Signed   By: Inez Catalina M.D.   On: 09/04/2019 21:58   DG HIP UNILAT W OR W/O PELVIS 2-3 VIEWS LEFT  Result Date: 09/05/2019 CLINICAL DATA:  Postop EXAM: DG HIP (WITH OR WITHOUT PELVIS) 2-3V LEFT COMPARISON:  CT 09/04/2019 FINDINGS: Status post left hip replacement with intact hardware and normal alignment. Gas in the soft tissues consistent with recent surgery. IMPRESSION: Status post left hip replacement with expected postsurgical change. Electronically Signed   By: Donavan Foil M.D.   On: 09/05/2019 19:11   DG Hip Unilat With Pelvis 2-3 Views Left  Result Date: 09/04/2019 CLINICAL DATA:  Recent fall with hip pain, initial encounter EXAM: DG HIP (WITH OR WITHOUT PELVIS) 3V LEFT COMPARISON:   None. FINDINGS: Pelvic ring is intact. Degenerative changes of lower lumbar spine are noted. Right hip shows mild degenerative change. The femur somewhat rotated although no definitive fracture is seen. No soft tissue abnormality is noted. IMPRESSION: No definitive fracture is noted. Electronically Signed   By: Inez Catalina M.D.   On: 09/04/2019 21:57     Assessment and Recommendation  86 y.o. female with known paroxysmal nonvalvular atrial fibrillation diabetes hypertension chronic kidney disease status post previous pacemaker placement with acute fall and hip injury status post orthopedic surgery without complication and no current evidence of congestive heart failure or acute coronary syndrome 1.  Continue current medical regimen for heart rate control and maintenance of normal sinus rhythm including metoprolol 2.  Reinstatement of Eliquis as okay with orthopedic surgery for further risk reduction in stroke with atrial fibrillation 3.  Proceed to rehabilitation without restriction 4.  No further cardiac diagnostics necessary at this time 5.  Call if further questions otherwise if ambulating in rehab without any difficulties will follow-up in 1 month  Signed, Serafina Royals M.D. FACC

## 2019-09-06 NOTE — TOC Progression Note (Signed)
Transition of Care [facility]) - Progression Note    Patient Details  Name: Krystal Goodwin MRN: [id number] Date of Birth: August 04, 1933  [facility] CM/SW Contact  Su Hilt, RN Phone Number: 09/06/2019, 1:15 PM  Clinical Narrative:   Damaris Schooner with [facility] and reviewed the bed offers. She chose [facility]  faxed clinical notes to [facility] to get insurance approval         Expected Discharge Plan and Services                                                 Social Determinants of Health (SDOH) Interventions    Readmission Risk Interventions No flowsheet data found.

## 2019-09-06 NOTE — Progress Notes (Signed)
Physical Therapy Treatment Patient Details Name: Krystal Goodwin MRN: [id number] DOB: [birth year] Today's Date: 09/06/2019    History of Present Illness Pt is 86 y.o. female s/p L hip hemiarthroplasty on 09/05/19.  PMH includes: HTN, former smoker, sleep apnea, asthma, SOB, renal disease, diabetes, hypothyroidism, anemia, and arthritis.    PT Comments    Pt received in bed following getting bathed.  Pt was hopeful in getting to the chair in order to sit up for a little while.    Pt was able to perform exercises listed below with decreasing difficulty.  Pt is making progress towards goals and was able to tolerate transfer into chair today.  +2 modA was utilized in order to perform bed mobility, sit <> stand, and ambulation to chair.  Pt did have one instance of having a larger step towards the chair, but overall utilized a scooting method of B feet.  Pt will continue to benefit from skilled therapy to address deficits and regain PLOF.    Follow Up Recommendations  SNF     Equipment Recommendations       Recommendations for Other Services       Precautions / Restrictions Precautions Precautions: Posterior Hip Precaution Booklet Issued: No Precaution Comments: Wedge pillow not present in the room, so 2 pillows placed on the inside to prevent IR and adduction of the L hip. Restrictions Weight Bearing Restrictions: Yes LLE Weight Bearing: Weight bearing as tolerated    Mobility  Bed Mobility Overal bed mobility: Needs Assistance Bed Mobility: Supine to Sit Rolling: Mod assist;+2 for physical assistance   Supine to sit: +2 for physical assistance;Mod assist     General bed mobility comments: Pt unable to perform be mobility due to pain, so +2 modA was utilized in order to get pt to seated EOB.  Transfers Overall transfer level: Needs assistance Equipment used: Rolling walker (2 wheeled) Transfers: Sit to/from Stand Sit to Stand: Mod assist;+2 physical assistance             Ambulation/Gait Ambulation/Gait assistance: Mod assist;+2 physical assistance Gait Distance (Feet): 4 Feet Assistive device: Rolling walker (2 wheeled) Gait Pattern/deviations: Decreased step length - right;Decreased step length - left;Decreased stance time - right;Decreased stride length;Decreased weight shift to left;Steppage Gait velocity: decreased.   General Gait Details: Pt unable to ambulate without +2 modA for physical assistance and maneuvering walker and keeping pt safe.  Pt required encouragement and verbal cuing for where to place the feet/hands throughout ambulation.   Stairs             Wheelchair Mobility    Modified Rankin (Stroke Patients Only)       Balance Overall balance assessment: Needs assistance Sitting-balance support: Bilateral upper extremity supported;Feet supported Sitting balance-Leahy Scale: Poor Sitting balance - Comments: Pt exhibits a posterior lean when in seated position and requires verbal cuing to remain in good posture. Postural control: Posterior lean Standing balance support: Bilateral upper extremity supported Standing balance-Leahy Scale: Poor Standing balance comment: Pt able to push weight through arms, however requires +2 modA to remain upright.                            Cognition Arousal/Alertness: Awake/alert Behavior During Therapy: WFL for tasks assessed/performed Overall Cognitive Status: History of cognitive impairments - at baseline  General Comments: Daughter notes that her cognition was even worse than in AM session.  Pt is fixated on past/previous events and speaks of them heavily throughout session.      Exercises Total Joint Exercises Ankle Circles/Pumps: AROM;Strengthening;10 reps;Supine Quad Sets: AROM;Strengthening;10 reps;Supine Gluteal Sets: AROM;Strengthening;10 reps;Supine Hip ABduction/ADduction: AROM;Strengthening;10 reps;Supine (Pt  performed adduction within precaution limits.) Straight Leg Raises: AROM;Strengthening;Right;10 reps;AAROM;Left    General Comments        Pertinent Vitals/Pain Pain Assessment: Faces Faces Pain Scale: Hurts even more Pain Location: L Hip Pain Descriptors / Indicators: Aching;Grimacing;Sharp Pain Intervention(s): Limited activity within patient's tolerance;Monitored during session;Premedicated before session;Repositioned    Home Living                      Prior Function            PT Goals (current goals can now be found in the care plan section) Acute Rehab PT Goals Patient Stated Goal: To get stronger and walk. PT Goal Formulation: With patient/family Time For Goal Achievement: 09/20/19 Potential to Achieve Goals: Fair Progress towards PT goals: Progressing toward goals    Frequency    BID      PT Plan Current plan remains appropriate    Co-evaluation              AM-PAC PT "6 Clicks" Mobility   Outcome Measure  Help needed turning from your back to your side while in a flat bed without using bedrails?: A Lot Help needed moving from lying on your back to sitting on the side of a flat bed without using bedrails?: A Lot Help needed moving to and from a bed to a chair (including a wheelchair)?: A Lot Help needed standing up from a chair using your arms (e.g., wheelchair or bedside chair)?: A Lot Help needed to walk in hospital room?: Total Help needed climbing 3-5 steps with a railing? : Total 6 Click Score: 10    End of Session Equipment Utilized During Treatment: Gait belt Activity Tolerance: Patient tolerated treatment well;Patient limited by pain Patient left: in chair;with call bell/phone within reach;with chair alarm set;with family/visitor present (SCD's not applied upon arrival to pt's room.) Nurse Communication: Mobility status;Precautions PT Visit Diagnosis: Unsteadiness on feet (R26.81);Other abnormalities of gait and mobility  (R26.89);Repeated falls (R29.6);Muscle weakness (generalized) (M62.81);History of falling (Z91.81);Difficulty in walking, not elsewhere classified (R26.2);Pain Pain - Right/Left: Left Pain - part of body: Hip     Time: 6578-4696 PT Time Calculation (min) (ACUTE ONLY): 33 min  Charges:  $Gait Training: 8-22 mins $Therapeutic Exercise: 8-22 mins                      Gwenlyn Saran, PT, DPT 09/06/19, 5:23 PM

## 2019-09-06 NOTE — Evaluation (Addendum)
Physical Therapy Evaluation Patient Details Name: Krystal Goodwin MRN: [id number] DOB: 12-Aug-1933 Today's Date: 09/06/2019   History of Present Illness  Pt is 86 y.o. female s/p L hip hemiarthroplasty on 09/05/19.  PMH includes: HTN, former smoker, sleep apnea, asthma, SOB, renal disease, diabetes, hypothyroidism, anemia, and arthritis.     Clinical Impression  Pt received in bed with HOB elevated.  Therapist attempted to obtain history but pt is questionable historian.    Pt able to perform exercises as listed below while in supine position, with consistent verbal cuing to continue and for counting.  Upon attempting to transfer to the sitting at EOB, pt refused due to pain with transfer.  Difficulty with transfer was due to pain level and due to [facility] being elevated and not being able to lower.  Pt performed well with limited exercises however coming seated to EOB and into chair if possible would be beneficial.  Therapist will attempt to progress exercises and transfer ability later in PM session.  At conclusion of session, daughter arrive and was able to fill in information on living situation and reports that she feels pt will need SNF and has already spoken with [facility] prior to fall about potentially being transferred to that facility.  Pt encourage to speak with case manager for arrangements.  Pt will benefit from PT services in a SNF setting upon discharge to safely address deficits listed in patient problem list for decreased caregiver assistance and eventual return to PLOF.   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  No wedge pillow was available in the room, so 2 pillows were utilized in order to prevent IR and adduction of the L hip to abide by precautions.      Follow Up Recommendations SNF    Equipment Recommendations       Recommendations for Other Services       Precautions / Restrictions Precautions Precautions: Posterior Hip Precaution Booklet Issued:  No Precaution Comments: Wedge pillow not present in the room, so 2 pillows placed on the inside to prevent IR and adduction of the L hip. Restrictions Weight Bearing Restrictions: Yes LLE Weight Bearing: Weight bearing as tolerated      Mobility  Bed Mobility Overal bed mobility: Needs Assistance Bed Mobility: Rolling;Supine to Sit Rolling: Mod assist   Supine to sit: Max assist     General bed mobility comments: Pt is unable to perform most bed mobility due to pain.  Therapist assisted, but pt was refusing to get into sitting position because of pain level.  HOB also elevated and was unable to be lowered, which decreased mobility as well.  Transfers                    Ambulation/Gait                Stairs            Wheelchair Mobility    Modified Rankin (Stroke Patients Only)       Balance                                             Pertinent Vitals/Pain Pain Assessment: 0-10 Pain Score: 8  Pain Location: L Hip Pain Descriptors / Indicators: Aching;Grimacing;Sharp Pain Intervention(s): Limited activity within patient's tolerance;Monitored during session;RN gave pain meds during session;Ice applied;Repositioned  Home Living Family/patient expects to be discharged to:: Skilled nursing facility                 Additional Comments: Pt currently living in independent living, but after speaking with duaghter, pt is planning on discharging to a SNF.    Prior Function Level of Independence: Needs assistance      ADL's / Homemaking Assistance Needed: Pt received assistance 3 days/week for most ADLs.        Hand Dominance        Extremity/Trunk Assessment   Upper Extremity Assessment Upper Extremity Assessment: Generalized weakness    Lower Extremity Assessment Lower Extremity Assessment: Generalized weakness;LLE deficits/detail LLE: Unable to fully assess due to pain LLE Sensation: WNL        Communication      Cognition Arousal/Alertness: Awake/alert Behavior During Therapy: WFL for tasks assessed/performed Overall Cognitive Status: History of cognitive impairments - at baseline                                 General Comments: Daughter notes that pt has some level of dementia and is unable to communicate as effectively because of it.      General Comments      Exercises Total Joint Exercises Ankle Circles/Pumps: AROM;Strengthening;10 reps;Supine Quad Sets: AROM;Strengthening;10 reps;Supine Gluteal Sets: AROM;Strengthening;10 reps;Supine Hip ABduction/ADduction: AROM;Strengthening;10 reps;Supine (Pt performed adduction within precaution limits.)   Assessment/Plan    PT Assessment Patient needs continued PT services  PT Problem List Decreased strength;Decreased mobility;Decreased safety awareness;Decreased range of motion;Decreased knowledge of precautions;Obesity;Decreased activity tolerance;Decreased cognition;Decreased balance;Decreased knowledge of use of DME;Pain       PT Treatment Interventions DME instruction;Therapeutic exercise;Gait training;Balance training;Functional mobility training;Therapeutic activities;Patient/family education    PT Goals (Current goals can be found in the Care Plan section)  Acute Rehab PT Goals Patient Stated Goal: To get stronger and walk. PT Goal Formulation: With patient/family Time For Goal Achievement: 09/20/19 Potential to Achieve Goals: Poor    Frequency  BID   Barriers to discharge        Co-evaluation               AM-PAC PT "6 Clicks" Mobility  Outcome Measure Help needed turning from your back to your side while in a flat bed without using bedrails?: A Lot Help needed moving from lying on your back to sitting on the side of a flat bed without using bedrails?: Total Help needed moving to and from a bed to a chair (including a wheelchair)?: Total Help needed standing up from a chair using  your arms (e.g., wheelchair or bedside chair)?: Total Help needed to walk in hospital room?: Total Help needed climbing 3-5 steps with a railing? : Total 6 Click Score: 7    End of Session   Activity Tolerance: Patient limited by pain Patient left: in bed;with call bell/phone within reach;with bed alarm set;with family/visitor present;with SCD's reapplied Nurse Communication: Mobility status;Precautions PT Visit Diagnosis: Unsteadiness on feet (R26.81);Other abnormalities of gait and mobility (R26.89);Repeated falls (R29.6);Muscle weakness (generalized) (M62.81);History of falling (Z91.81);Difficulty in walking, not elsewhere classified (R26.2);Pain Pain - Right/Left: Left Pain - part of body: Hip    Time: 7253-6644 PT Time Calculation (min) (ACUTE ONLY): 46 min   Charges:   PT Evaluation $PT Eval Low Complexity: 1 Low PT Treatments $Therapeutic Exercise: 8-22 mins        Gwenlyn Saran, PT, DPT 09/06/19, 10:45 AM

## 2019-09-06 NOTE — Progress Notes (Signed)
[facility] [place] NAME: Krystal Goodwin    MR#:  [id number]  DATE OF BIRTH:  10-26-1933  SUBJECTIVE:  patient came in from [facility] independent [facility] after mechanical fall.  POD#1 tolerated surgery well  REVIEW OF SYSTEMS:   Review of Systems  Constitutional: Negative for chills, fever and weight loss.  HENT: Negative for ear discharge, ear pain and nosebleeds.   Eyes: Negative for blurred vision, pain and discharge.  Respiratory: Negative for sputum production, shortness of breath, wheezing and stridor.   Cardiovascular: Negative for chest pain, palpitations, orthopnea and PND.  Gastrointestinal: Negative for abdominal pain, diarrhea, nausea and vomiting.  Genitourinary: Negative for frequency and urgency.  Musculoskeletal: Positive for falls and joint pain. Negative for back pain.  Neurological: Positive for weakness. Negative for sensory change, speech change and focal weakness.  Psychiatric/Behavioral: Positive for hallucinations. Negative for depression. The patient is not nervous/anxious.    Tolerating Diet:yes Tolerating PT: pending  DRUG ALLERGIES:   Allergies  Allergen Reactions  . Amoxicillin Itching    Breaks out in a rash  . Erythromycin Nausea And Vomiting  . Vicodin [Hydrocodone-Acetaminophen] Nausea And Vomiting    VITALS:  Blood pressure (!) 121/41, pulse 84, temperature 98.1 F (36.7 C), temperature source Oral, resp. rate 18, height 5\' 5"  (1.651 m), weight 90.7 kg, SpO2 91 %.  PHYSICAL EXAMINATION:   Physical Exam  GENERAL:  86 y.o.-year-old patient lying in the bed with no acute distress. Obese EYES: Pupils equal, round, reactive to light and accommodation. No scleral icterus.   HEENT: Head atraumatic, normocephalic. Oropharynx and nasopharynx clear.  NECK:  Supple, no jugular venous distention. No thyroid enlargement, no tenderness.  LUNGS: Normal breath sounds bilaterally, no wheezing, rales, rhonchi.  No use of accessory muscles of respiration.  CARDIOVASCULAR: S1, S2 normal. No murmurs, rubs, or gallops.  ABDOMEN: Soft, nontender, nondistended. Bowel sounds present. No organomegaly or mass.  EXTREMITIES: left lower extremity surgical dressing+ NEUROLOGIC: Cranial nerves II through XII are intact. No focal Motor or sensory deficits b/l.   PSYCHIATRIC:  patient is alert and oriented x 3.  SKIN: No obvious rash, lesion, or ulcer.   LABORATORY PANEL:  CBC Recent Labs  Lab 09/06/19 0438  WBC 11.8*  HGB 8.7*  HCT 29.3*  PLT 182    Chemistries  Recent Labs  Lab 09/06/19 0438  NA 136  K 4.1  CL 106  CO2 22  GLUCOSE 139*  BUN 27*  CREATININE 1.41*  CALCIUM 7.9*   Cardiac Enzymes No results for input(s): TROPONINI in the last 168 hours. RADIOLOGY:  DG Chest 1 View  Result Date: 09/04/2019 CLINICAL DATA:  Recent fall with left hip pain, initial encounter EXAM: CHEST  1 VIEW COMPARISON:  08/02/2013 FINDINGS: Cardiac shadow is within normal limits. Postsurgical changes are noted. Pacing device is again seen. The lungs are well aerated bilaterally. No focal infiltrate or sizable effusion is noted. No bony abnormality noted. IMPRESSION: No active disease. Electronically Signed   By: Inez Catalina M.D.   On: 09/04/2019 22:00   CT Head Wo Contrast  Result Date: 09/04/2019 CLINICAL DATA:  Fall at home EXAM: CT HEAD WITHOUT CONTRAST TECHNIQUE: Contiguous axial images were obtained from the base of the skull through the vertex without intravenous contrast. COMPARISON:  None. FINDINGS: Brain: No evidence of acute territorial infarction, hemorrhage, hydrocephalus,extra-axial collection or mass lesion/mass effect. There is dilatation the ventricles and sulci consistent with age-related atrophy. Low-attenuation changes  in the deep white matter consistent with small vessel ischemia. Vascular: No hyperdense vessel or unexpected calcification. Skull: The skull is intact. No fracture or focal lesion  identified. Sinuses/Orbits: The visualized paranasal sinuses and mastoid air cells are clear. The orbits and globes intact. Other: None Cervical spine: Alignment: There is straightening of the normal cervical lordosis. Skull base and vertebrae: Visualized skull base is intact. No atlanto-occipital dissociation. The vertebral body heights are well maintained. No fracture or pathologic osseous lesion seen. Soft tissues and spinal canal: The visualized paraspinal soft tissues are unremarkable. No prevertebral soft tissue swelling is seen. The spinal canal is grossly unremarkable, no large epidural collection or significant canal narrowing. Disc levels: Multilevel cervical spine spondylosis is seen with disc height loss disc osteophyte complex and uncovertebral osteophytes most C5-C6 C6-C7 mild neural foraminal narrowing and central stenosis. Upper chest: The lung apices are clear. Thoracic inlet is within normal limits. Scattered carotid artery calcifications are seen. Other: None IMPRESSION: No acute intracranial abnormality. Findings consistent with age related atrophy and chronic small vessel ischemia No acute fracture or malalignment of the spine. Electronically Signed   By: Prudencio Pair M.D.   On: 09/04/2019 23:02   CT Cervical Spine Wo Contrast  Result Date: 09/04/2019 CLINICAL DATA:  Fall at home EXAM: CT HEAD WITHOUT CONTRAST TECHNIQUE: Contiguous axial images were obtained from the base of the skull through the vertex without intravenous contrast. COMPARISON:  None. FINDINGS: Brain: No evidence of acute territorial infarction, hemorrhage, hydrocephalus,extra-axial collection or mass lesion/mass effect. There is dilatation the ventricles and sulci consistent with age-related atrophy. Low-attenuation changes in the deep white matter consistent with small vessel ischemia. Vascular: No hyperdense vessel or unexpected calcification. Skull: The skull is intact. No fracture or focal lesion identified.  Sinuses/Orbits: The visualized paranasal sinuses and mastoid air cells are clear. The orbits and globes intact. Other: None Cervical spine: Alignment: There is straightening of the normal cervical lordosis. Skull base and vertebrae: Visualized skull base is intact. No atlanto-occipital dissociation. The vertebral body heights are well maintained. No fracture or pathologic osseous lesion seen. Soft tissues and spinal canal: The visualized paraspinal soft tissues are unremarkable. No prevertebral soft tissue swelling is seen. The spinal canal is grossly unremarkable, no large epidural collection or significant canal narrowing. Disc levels: Multilevel cervical spine spondylosis is seen with disc height loss disc osteophyte complex and uncovertebral osteophytes most C5-C6 C6-C7 mild neural foraminal narrowing and central stenosis. Upper chest: The lung apices are clear. Thoracic inlet is within normal limits. Scattered carotid artery calcifications are seen. Other: None IMPRESSION: No acute intracranial abnormality. Findings consistent with age related atrophy and chronic small vessel ischemia No acute fracture or malalignment of the spine. Electronically Signed   By: Prudencio Pair M.D.   On: 09/04/2019 23:02   CT Hip Left Wo Contrast  Result Date: 09/04/2019 CLINICAL DATA:  Left hip pain after fall at home in the kitchen. EXAM: CT OF THE LEFT HIP WITHOUT CONTRAST TECHNIQUE: Multidetector CT imaging of the left hip was performed according to the standard protocol. Multiplanar CT image reconstructions were also generated. COMPARISON:  Radiograph earlier this day. FINDINGS: Bones/Joint/Cartilage Impacted mildly displaced subcapital femoral neck fracture. Mild comminution and apex anterior angulation. Acetabulum and pubic rami are intact. Minor hip joint osteoarthritis. Ligaments Suboptimally assessed by CT. Muscles and Tendons No evidence of intramuscular hematoma. Soft tissues No focal soft tissue hematoma.  IMPRESSION: Impacted mildly displaced subcapital femoral neck fracture. Electronically Signed   By: Threasa Beards  [place] M.D.   On: 09/04/2019 22:59   DG Knee Complete 4 Views Left  Result Date: 09/04/2019 CLINICAL DATA:  Recent fall with left knee pain, initial encounter EXAM: LEFT KNEE - COMPLETE 4+ VIEW COMPARISON:  None. FINDINGS: Degenerative changes are noted within the lateral and patellofemoral joint spaces. No joint effusion is seen. No acute fracture or dislocation is noted. IMPRESSION: Degenerative change without acute abnormality. Electronically Signed   By: Inez Catalina M.D.   On: 09/04/2019 21:58   DG HIP UNILAT W OR W/O PELVIS 2-3 VIEWS LEFT  Result Date: 09/05/2019 CLINICAL DATA:  Postop EXAM: DG HIP (WITH OR WITHOUT PELVIS) 2-3V LEFT COMPARISON:  CT 09/04/2019 FINDINGS: Status post left hip replacement with intact hardware and normal alignment. Gas in the soft tissues consistent with recent surgery. IMPRESSION: Status post left hip replacement with expected postsurgical change. Electronically Signed   By: Donavan Foil M.D.   On: 09/05/2019 19:11   DG Hip Unilat With Pelvis 2-3 Views Left  Result Date: 09/04/2019 CLINICAL DATA:  Recent fall with hip pain, initial encounter EXAM: DG HIP (WITH OR WITHOUT PELVIS) 3V LEFT COMPARISON:  None. FINDINGS: Pelvic ring is intact. Degenerative changes of lower lumbar spine are noted. Right hip shows mild degenerative change. The femur somewhat rotated although no definitive fracture is seen. No soft tissue abnormality is noted. IMPRESSION: No definitive fracture is noted. Electronically Signed   By: Inez Catalina M.D.   On: 09/04/2019 21:57   ASSESSMENT AND PLAN:   MIKYA DON is a 86 y.o. female with medical history significant for type 2 diabetes mellitus, hypertension, hypothyroidism, chronic kidney disease stage III, heart block with pacer, and paroxysmal atrial fibrillation on Eliquis, now presenting to the emergency department with severe left  hip and knee pain after fall at home.  Patient reports that she had been in her usual state of health but fell twice recently, is unsure of exactly how this happened, but denies any loss of consciousness or head injury.  She fell backwards last night and began to experience severe left hip pain  1. Left hip fracture  status post mechanical fall - Presents with left hip pain after a fall and is found to have femoral neck fracture  - Orthopedic surgery consult with Dr Roland Rack appreciated  - POD#1 s/p Left hip unipolar hemiarthroplasty,  start PT. Ok to resume eliquis -cardiology consultation with Dr. Nehemiah Massed appreciated - hold ARB due to soft bp -continue pain-control and supportive care  -d/c IVF  2. Paroxysmal atrial fibrillation  - Ok to resume eliquis per Ortho and cardiology -continue beta-blockers  3. Type II DM -well controlled w/o complication - E8B was normal recently and she was going to be given a trial off of glipizide  -cont low-intensity SSI with Novolog if needed    4. CKD IIIb - SCr is 1.21 on admission, similar to priors  - Renally-dose medications, monitor    5. OSA  - Continue CPAP qHS   6. Hypertension  - BP at goal, hold losartan preoperatively   -cont BB   DVT prophylaxis: Eliquis  Code Status: Full  Family Communication: Discussed with patient  and daughter in the room Disposition Plan:  Patient is from: Home  Anticipated d/c is to: [facility] Anticipated d/c date is: 09/08/19 Patient currently: POD#1 need PT eval today Consults called: Orthopedic surgery consulted by ED physician  Admission status: Inpatient    TOC for d/c planning  TOTAL TIME TAKING CARE OF  THIS PATIENT: *25* minutes.  >50% time spent on counselling and coordination of care  Note: This dictation was prepared with Dragon dictation along with smaller phrase technology. Any transcriptional errors that result from this process are unintentional.  Fritzi Mandes M.D     [facility]   CC: Primary care physician; Derinda Late, MDPatient ID: Krystal Goodwin, female   DOB: 1933-10-01, 86 y.o.   MRN: [id number]

## 2019-09-07 DIAGNOSIS — E039 Hypothyroidism, unspecified: Secondary | ICD-10-CM

## 2019-09-07 LAB — GLUCOSE, CAPILLARY
Glucose-Capillary: 120 mg/dL — ABNORMAL HIGH (ref 70–99)
Glucose-Capillary: 121 mg/dL — ABNORMAL HIGH (ref 70–99)
Glucose-Capillary: 123 mg/dL — ABNORMAL HIGH (ref 70–99)
Glucose-Capillary: 132 mg/dL — ABNORMAL HIGH (ref 70–99)
Glucose-Capillary: 134 mg/dL — ABNORMAL HIGH (ref 70–99)
Glucose-Capillary: 136 mg/dL — ABNORMAL HIGH (ref 70–99)
Glucose-Capillary: 136 mg/dL — ABNORMAL HIGH (ref 70–99)
Glucose-Capillary: 148 mg/dL — ABNORMAL HIGH (ref 70–99)

## 2019-09-07 LAB — BASIC METABOLIC PANEL
Anion gap: 14 (ref 5–15)
BUN: 30 mg/dL — ABNORMAL HIGH (ref 8–23)
CO2: 19 mmol/L — ABNORMAL LOW (ref 22–32)
Calcium: 8 mg/dL — ABNORMAL LOW (ref 8.9–10.3)
Chloride: 101 mmol/L (ref 98–111)
Creatinine, Ser: 1.48 mg/dL — ABNORMAL HIGH (ref 0.44–1.00)
GFR calc Af Amer: 37 mL/min — ABNORMAL LOW (ref >60)
GFR calc non Af Amer: 32 mL/min — ABNORMAL LOW (ref >60)
Glucose, Bld: 135 mg/dL — ABNORMAL HIGH (ref 70–99)
Potassium: 3.9 mmol/L (ref 3.5–5.1)
Sodium: 134 mmol/L — ABNORMAL LOW (ref 135–145)

## 2019-09-07 LAB — CBC
HCT: 26 % — ABNORMAL LOW (ref 36.0–46.0)
Hemoglobin: 8.6 g/dL — ABNORMAL LOW (ref 12.0–15.0)
MCH: 28 pg (ref 26.0–34.0)
MCHC: 33.1 g/dL (ref 30.0–36.0)
MCV: 84.7 fL (ref 80.0–100.0)
Platelets: 153 10*3/uL (ref 150–400)
RBC: 3.07 MIL/uL — ABNORMAL LOW (ref 3.87–5.11)
RDW: 14.7 % (ref 11.5–15.5)
WBC: 11.9 10*3/uL — ABNORMAL HIGH (ref 4.0–10.5)
nRBC: 0 % (ref 0.0–0.2)

## 2019-09-07 NOTE — Plan of Care (Signed)
  Problem: Pain Managment: Goal: General experience of comfort will improve Outcome: Progressing   Problem: Safety: Goal: Ability to remain free from injury will improve Outcome: Progressing   Problem: Skin Integrity: Goal: Risk for impaired skin integrity will decrease Outcome: Progressing   

## 2019-09-07 NOTE — Plan of Care (Signed)
No IV currently. IV team has been notifed on prior shifts to assist, but requested that IV not be placed unless current orders (IV med administrations) are present- d/t prior scarring of available veins.  Dr. Darlyn Chamber and gave verbal ok to leave out at this point.

## 2019-09-07 NOTE — Progress Notes (Signed)
Subjective: 2 Days Post-Op Procedure(s) (LRB): ARTHROPLASTY BIPOLAR HIP (HEMIARTHROPLASTY) (Left) Patient reports pain as mild to the left hip, reports moderate knee pain. Patient is well, and has had no acute complaints or problems PT and care management to assist with discharge planning. Negative for chest pain and shortness of breath Fever: no Gastrointestinal:Negative for nausea and vomiting  Objective: Vital signs in last 24 hours: Temp:  [97.4 F (36.3 C)-99 F (37.2 C)] 97.4 F (36.3 C) (07/31 0031) Pulse Rate:  [81-92] 83 (07/31 0031) Resp:  [14-18] 17 (07/31 0031) BP: (100-134)/(44-55) 134/54 (07/31 0031) SpO2:  [90 %-96 %] 95 % (07/31 0031)  Intake/Output from previous day:  Intake/Output Summary (Last 24 hours) at 09/07/2019 0723 Last data filed at 09/06/2019 1600 Gross per 24 hour  Intake 437 ml  Output --  Net 437 ml    Intake/Output this shift: No intake/output data recorded.  Labs: Recent Labs    09/04/19 2320 09/05/19 0353 09/06/19 0438 09/07/19 0453  HGB 11.9* 10.7* 8.7* 8.6*   Recent Labs    09/06/19 0438 09/07/19 0453  WBC 11.8* 11.9*  RBC 3.26* 3.07*  HCT 29.3* 26.0*  PLT 182 153   Recent Labs    09/06/19 0438 09/07/19 0453  NA 136 134*  K 4.1 3.9  CL 106 101  CO2 22 19*  BUN 27* 30*  CREATININE 1.41* 1.48*  GLUCOSE 139* 135*  CALCIUM 7.9* 8.0*   No results for input(s): LABPT, INR in the last 72 hours.   EXAM General - Patient is Alert, Appropriate and Oriented Extremity - ABD soft Neurovascular intact Sensation intact distally Intact pulses distally Dorsiflexion/Plantar flexion intact Incision: dressing C/D/I No cellulitis present Dressing/Incision - clean, dry, no drainage Motor Function - intact, moving foot and toes well on exam.  Abdomen is soft with palpation.  Past Medical History:  Diagnosis Date  . Anemia    iron def.anemia  . Aortic stenosis, severe   . Asthma   . Chest pain   . Colon cancer ([place])    . Complication of anesthesia    nausea  . Diabetes mellitus without complication ([place])   . Dyslipidemia   . Dyspnea   . Gout, joint   . H/O postmenopausal osteoporosis   . Hematuria   . History of palpitations   . Hx of colonic polyps   . Hypertension   . Hypothyroidism   . IBS (irritable bowel syndrome)   . Schatzki's ring   . Sleep apnea     Assessment/Plan: 2 Days Post-Op Procedure(s) (LRB): ARTHROPLASTY BIPOLAR HIP (HEMIARTHROPLASTY) (Left) Principal Problem:   Closed fracture of left hip (HCC) Active Problems:   Diabetes mellitus type II, non insulin dependent (HCC)   Hypothyroidism   CKD (chronic kidney disease), stage III   Hypertension   Sleep apnea   PAF (paroxysmal atrial fibrillation) ([place])   Fall  Estimated body mass index is 33.28 kg/m as calculated from the following:   Height as of this encounter: 5\' 5"  (1.651 m).   Weight as of this encounter: 90.7 kg. Advance diet Up with therapy D/C IV fluids when tolerating oral intake.  Labs reviewed this AM. Continue working on BM at this time. Eliquis for DVT prophylaxis Plan for discharge with Norco in addition to tramadol as needed for pain.  Upon discharge from hospital, if she discharges to SNF can have the rehab facility remove staples on 09/19/19 and follow-up with [facility] orthopaedics in 6 weeks. If discharge home follow-up in the office  in two weeks for staple removal.  DVT Prophylaxis - Foot Pumps, TED hose and Eliquis Weight-Bearing as tolerated to left leg  Reche Dixon, PA-C [facility] Orthopaedic Surgery 09/07/2019, 7:23 AM

## 2019-09-07 NOTE — Progress Notes (Signed)
Physical Therapy Treatment Patient Details Name: Krystal Goodwin MRN: [id number] DOB: 1933-11-15 Today's Date: 09/07/2019    History of Present Illness Pt is 86 y.o. female s/p L hip hemiarthroplasty on 09/05/19.  PMH includes: HTN, former smoker, sleep apnea, asthma, SOB, renal disease, diabetes, hypothyroidism, anemia, and arthritis.    PT Comments    Pt was long sitting in bed with 2 L O2 [place] donned. She is slightly lethargic but agreeable to PT session. Disoriented and confused however was able to follow commands well. She required max assist to exit R side of bed. Pt unable to recall hip precautions and was cued for improved technique. Sat EOB x several minutes with CGA for safety. Stood and took several steps (3) to recliner. Pt tends to ambulate on forefeet due to tightness in heel cords bilaterally. Pt does complain of L hip pain with all movements. RN tech in room and assisting pt at conclusion of session. PT was in recliner with chair alarm in place, call bell in reach, and RN aware of her abilities. Pt will benefit form continued skilled PT to address deficits and improve safe functional mobility.  Recommending SNF when medically stable.    Follow Up Recommendations  SNF     Equipment Recommendations  Rolling walker with 5" wheels;3in1 (PT)    Recommendations for Other Services       Precautions / Restrictions Precautions Precautions: Posterior Hip Precaution Booklet Issued: No Restrictions Weight Bearing Restrictions: Yes LLE Weight Bearing: Weight bearing as tolerated    Mobility  Bed Mobility Overal bed mobility: Needs Assistance Bed Mobility: Supine to Sit Rolling: Max assist;+2 for safety/equipment   Supine to sit: Max assist;+2 for safety/equipment     General bed mobility comments: Pt was able to exit R sid eof bed with increased time and max vcs + assistance. pt has sharp pain with movements but once seated EOB relieves slightly. Pt unable to discribe hip  precautions and requires assistance to adhere throughout  Transfers Overall transfer level: Needs assistance Equipment used: Rolling walker (2 wheeled) Transfers: Sit to/from Stand Sit to Stand: Max assist;+2 safety/equipment;From elevated surface         General transfer comment: Pt was able to stand from elevated bed height with max assist of one + 2nd person(rn tech) in room for safety. Vcs throughout for improved technique  Ambulation/Gait Ambulation/Gait assistance: Max assist;+2 safety/equipment Gait Distance (Feet): 3 Feet Assistive device: Rolling walker (2 wheeled) Gait Pattern/deviations: Step-to pattern;Antalgic;Decreased step length - right;Decreased stance time - left;Decreased stride length;Trunk flexed Gait velocity: decreased.   General Gait Details: pt was able to take ~ 3 steps to recliner with very slow antalgic step to pattern.   Stairs             Wheelchair Mobility    Modified Rankin (Stroke Patients Only)       Balance Overall balance assessment: Needs assistance Sitting-balance support: Bilateral upper extremity supported;Feet supported Sitting balance-Leahy Scale: Fair Sitting balance - Comments: pt was able to maintain sitting balance EOB with CGA for safety   Standing balance support: Bilateral upper extremity supported Standing balance-Leahy Scale: Poor Standing balance comment: heavy reliance on RW for support                             Cognition Arousal/Alertness: Lethargic Behavior During Therapy: WFL for tasks assessed/performed (slightly anxious) Overall Cognitive Status: History of cognitive impairments - at baseline  General Comments: Pt is alert but disoriented. was able to follow commands consistently throughout with increased time to process. no family present to discuss baseline cognition but per notes has baseline deficits      Exercises General Exercises -  Lower Extremity Ankle Circles/Pumps: AROM;10 reps (poor dorsiflexion) Quad Sets: AROM;10 reps Gluteal Sets: AROM;5 reps    General Comments        Pertinent Vitals/Pain Pain Assessment: 0-10 Pain Score: 6  Faces Pain Scale: Hurts whole lot Pain Location: L Hip Pain Descriptors / Indicators: Aching;Grimacing;Sharp Pain Intervention(s): Limited activity within patient's tolerance;Monitored during session;Repositioned;Ice applied    Home Living                      Prior Function            PT Goals (current goals can now be found in the care plan section) Acute Rehab PT Goals Patient Stated Goal: I want to be able to move with it hurting Progress towards PT goals: Progressing toward goals    Frequency    BID      PT Plan Current plan remains appropriate    Co-evaluation              AM-PAC PT "6 Clicks" Mobility   Outcome Measure  Help needed turning from your back to your side while in a flat bed without using bedrails?: A Lot Help needed moving from lying on your back to sitting on the side of a flat bed without using bedrails?: A Lot Help needed moving to and from a bed to a chair (including a wheelchair)?: A Lot Help needed standing up from a chair using your arms (e.g., wheelchair or bedside chair)?: A Lot Help needed to walk in hospital room?: Total Help needed climbing 3-5 steps with a railing? : A Lot 6 Click Score: 11    End of Session Equipment Utilized During Treatment: Gait belt;Oxygen Activity Tolerance: Patient tolerated treatment well;Patient limited by pain Patient left: in chair;with call bell/phone within reach;with chair alarm set;with family/visitor present Nurse Communication: Mobility status;Precautions PT Visit Diagnosis: Unsteadiness on feet (R26.81);Other abnormalities of gait and mobility (R26.89);Repeated falls (R29.6);Muscle weakness (generalized) (M62.81);History of falling (Z91.81);Difficulty in walking, not elsewhere  classified (R26.2);Pain Pain - Right/Left: Left Pain - part of body: Hip     Time: 2353-6144 PT Time Calculation (min) (ACUTE ONLY): 23 min  Charges:  $Therapeutic Activity: 23-37 mins                     Julaine Fusi PTA 09/07/19, 10:23 AM

## 2019-09-07 NOTE — Progress Notes (Signed)
[facility] [place] NAME: Krystal Goodwin    MR#:  [id number]  DATE OF BIRTH:  11/07/1933  SUBJECTIVE:  patient came in from [facility] after mechanical fall.  POD# 2 tolerating PT well daughter in the room. Patient intermittent pleasantly confused  REVIEW OF SYSTEMS:   Review of Systems  Constitutional: Negative for chills, fever and weight loss.  HENT: Negative for ear discharge, ear pain and nosebleeds.   Eyes: Negative for blurred vision, pain and discharge.  Respiratory: Negative for sputum production, shortness of breath, wheezing and stridor.   Cardiovascular: Negative for chest pain, palpitations, orthopnea and PND.  Gastrointestinal: Negative for abdominal pain, diarrhea, nausea and vomiting.  Genitourinary: Negative for frequency and urgency.  Musculoskeletal: Positive for falls and joint pain. Negative for back pain.  Neurological: Positive for weakness. Negative for sensory change, speech change and focal weakness.  Psychiatric/Behavioral: Positive for hallucinations. Negative for depression. The patient is not nervous/anxious.    Tolerating Diet:yes Tolerating PT: rehab  DRUG ALLERGIES:   Allergies  Allergen Reactions  . Amoxicillin Itching    Breaks out in a rash  . Erythromycin Nausea And Vomiting  . Vicodin [Hydrocodone-Acetaminophen] Nausea And Vomiting    VITALS:  Blood pressure (!) 120/41, pulse 89, temperature 98.6 F (37 C), resp. rate 18, height 5\' 5"  (1.651 m), weight 90.7 kg, SpO2 93 %.  PHYSICAL EXAMINATION:   Physical Exam  GENERAL:  86 y.o.-year-old patient lying in the bed with no acute distress. Obese EYES: Pupils equal, round, reactive to light and accommodation. No scleral icterus.   HEENT: Head atraumatic, normocephalic. Oropharynx and nasopharynx clear.  NECK:  Supple, no jugular venous distention. No thyroid enlargement, no tenderness.  LUNGS: Normal breath sounds  bilaterally, no wheezing, rales, rhonchi. No use of accessory muscles of respiration.  CARDIOVASCULAR: S1, S2 normal. No murmurs, rubs, or gallops.  ABDOMEN: Soft, nontender, nondistended. Bowel sounds present. No organomegaly or mass.  EXTREMITIES: left lower extremity surgical dressing+ NEUROLOGIC: Cranial nerves II through XII are intact. No focal Motor or sensory deficits b/l.   PSYCHIATRIC:  patient is alert and oriented x 3.  SKIN: No obvious rash, lesion, or ulcer.   LABORATORY PANEL:  CBC Recent Labs  Lab 09/07/19 0453  WBC 11.9*  HGB 8.6*  HCT 26.0*  PLT 153    Chemistries  Recent Labs  Lab 09/07/19 0453  NA 134*  K 3.9  CL 101  CO2 19*  GLUCOSE 135*  BUN 30*  CREATININE 1.48*  CALCIUM 8.0*   Cardiac Enzymes No results for input(s): TROPONINI in the last 168 hours. RADIOLOGY:  DG HIP UNILAT W OR W/O PELVIS 2-3 VIEWS LEFT  Result Date: 09/05/2019 CLINICAL DATA:  Postop EXAM: DG HIP (WITH OR WITHOUT PELVIS) 2-3V LEFT COMPARISON:  CT 09/04/2019 FINDINGS: Status post left hip replacement with intact hardware and normal alignment. Gas in the soft tissues consistent with recent surgery. IMPRESSION: Status post left hip replacement with expected postsurgical change. Electronically Signed   By: Donavan Foil M.D.   On: 09/05/2019 19:11   ASSESSMENT AND PLAN:   REBECAH Goodwin is a 86 y.o. female with medical history significant for type 2 diabetes mellitus, hypertension, hypothyroidism, chronic kidney disease stage III, heart block with pacer, and paroxysmal atrial fibrillation on Eliquis, now presenting to the emergency department with severe left hip and knee pain after fall at home.  Patient reports that she had been in her  usual state of health but fell twice recently, is unsure of exactly how this happened, but denies any loss of consciousness or head injury.  She fell backwards last night and began to experience severe left hip pain  1. Left hip fracture  status post  mechanical fall - Presents with left hip pain after a fall and is found to have femoral neck fracture  - Orthopedic surgery consult with Dr Roland Rack appreciated  - POD#2 s/p Left hip unipolar hemiarthroplasty,  start PT. Ok to resume eliquis - Pre-op cardiology consultation with Dr. Nehemiah Massed appreciated - holding ARB due to soft bp -continue pain-control and supportive care   2. Paroxysmal atrial fibrillation  - Ok to resume eliquis per Ortho and cardiology -continue beta-blockers  3. Type II DM -well controlled w/o complication - O8L was normal recently and she was going to be given a trial off of glipizide  -cont low-intensity SSI with Novolog if needed    4. CKD IIIb - SCr is 1.21 on admission, similar to priors  - Renally-dose medications, monitor    5. OSA  - Continue CPAP qHS   6. Hypertension  - BP at goal, hold losartan preoperatively   -cont BB   DVT prophylaxis: Eliquis  Code Status: Full  Family Communication: Discussed with patient  and daughter in the room Disposition Plan:  Patient is from: Home  Anticipated d/c is to: [facility] Anticipated d/c date is: 09/08/19 Patient currently: POD# 2 awaiting insurance authorization Consults called: Orthopedic surgery consulted by ED physician  Admission status: Inpatient    TOC for d/c planning  TOTAL TIME TAKING CARE OF THIS PATIENT: *25* minutes.  >50% time spent on counselling and coordination of care  Note: This dictation was prepared with Dragon dictation along with smaller phrase technology. Any transcriptional errors that result from this process are unintentional.  Fritzi Mandes M.D    [facility]   CC: Primary care physician; Derinda Late, MDPatient ID: Krystal Goodwin, female   DOB: February 26, 1933, 86 y.o.   MRN: [id number]

## 2019-09-07 NOTE — Progress Notes (Signed)
Physical Therapy Treatment Patient Details Name: Krystal Goodwin MRN: [id number] DOB: [birth year] Today's Date: 09/07/2019    History of Present Illness Pt is 86 y.o. female s/p L hip hemiarthroplasty on 09/05/19.  PMH includes: HTN, former smoker, sleep apnea, asthma, SOB, renal disease, diabetes, hypothyroidism, anemia, and arthritis.    PT Comments    Pt was seated up in recliner still from AM session. She reports feeling ready to get back into bed and states her L hip is "killing me." She was able to stand with max assist + vcs. Unable to advance LEs this afternoon but was able in AM session. Unable to advance due to pain. RN aware and issuing pain meds at conclusion of session. Max assist with 2nd person for safety to stand pivot to EOB. Therapist issued HEP handout however pt unwilling to perform 2/2 to pain. Acute PT will continue to follow and advance as able per pt tolerance. Continued recommendation for DC to SNF to address deficits and improve safe functional mobility.     Follow Up Recommendations  SNF     Equipment Recommendations  Rolling walker with 5" wheels;3in1 (PT)    Recommendations for Other Services       Precautions / Restrictions Precautions Precautions: Posterior Hip Precaution Booklet Issued: Yes (comment) Restrictions Weight Bearing Restrictions: Yes LLE Weight Bearing: Weight bearing as tolerated    Mobility  Bed Mobility Overal bed mobility: Needs Assistance Bed Mobility: Sit to Supine       Sit to supine: Max assist;+2 for safety/equipment   General bed mobility comments: Pt required max assist to return to supine form EOB short sit. pain most limiting factor throughout session  Transfers Overall transfer level: Needs assistance Equipment used: Rolling walker (2 wheeled) Transfers: Sit to/from Stand Sit to Stand: Max assist;+2 safety/equipment         General transfer comment: Max assist required with max vcs for technique. pt present with  increased pain in PM session and was unable to advance LE once standing. was able to advance feet to taking steps in AM session. max assist with 2nd person for safety to stand pivot from recliner to EOB  Ambulation/Gait             General Gait Details: unable to advance lift LEs 2/2 to pain   Stairs             Wheelchair Mobility    Modified Rankin (Stroke Patients Only)       Balance Overall balance assessment: Needs assistance Sitting-balance support: Bilateral upper extremity supported;Feet supported Sitting balance-Leahy Scale: Fair Sitting balance - Comments: pt was able to maintain sitting balance EOB with CGA for safety   Standing balance support: Bilateral upper extremity supported Standing balance-Leahy Scale: Poor Standing balance comment: heavy reliance on RW for support                             Cognition Arousal/Alertness: Awake/alert Behavior During Therapy: WFL for tasks assessed/performed Overall Cognitive Status: History of cognitive impairments - at baseline                                 General Comments: Pt is alert but disoriented. was able to follow commands consistently throughout with increased time to process. no family present to discuss baseline cognition but per notes has baseline deficits  Exercises      General Comments        Pertinent Vitals/Pain Pain Assessment: 0-10 Pain Score: 9  Faces Pain Scale: Hurts whole lot Pain Location: L Hip Pain Descriptors / Indicators: Aching;Grimacing;Sharp Pain Intervention(s): Limited activity within patient's tolerance;Monitored during session;Repositioned;Patient requesting pain meds-RN notified;Ice applied    Home Living                      Prior Function            PT Goals (current goals can now be found in the care plan section) Acute Rehab PT Goals Patient Stated Goal: for it not to hurt Progress towards PT goals: Not progressing  toward goals - comment (limited by pain)    Frequency    BID      PT Plan Current plan remains appropriate    Co-evaluation              AM-PAC PT "6 Clicks" Mobility   Outcome Measure  Help needed turning from your back to your side while in a flat bed without using bedrails?: A Lot Help needed moving from lying on your back to sitting on the side of a flat bed without using bedrails?: A Lot Help needed moving to and from a bed to a chair (including a wheelchair)?: A Lot Help needed standing up from a chair using your arms (e.g., wheelchair or bedside chair)?: A Lot Help needed to walk in hospital room?: Total Help needed climbing 3-5 steps with a railing? : Total 6 Click Score: 10    End of Session Equipment Utilized During Treatment: Gait belt Activity Tolerance: Patient limited by pain Patient left: in bed;with call bell/phone within reach;with bed alarm set;with nursing/sitter in room Nurse Communication: Mobility status;Precautions PT Visit Diagnosis: Unsteadiness on feet (R26.81);Other abnormalities of gait and mobility (R26.89);Repeated falls (R29.6);Muscle weakness (generalized) (M62.81);History of falling (Z91.81);Difficulty in walking, not elsewhere classified (R26.2);Pain Pain - Right/Left: Left Pain - part of body: Hip     Time: 4818-5631 PT Time Calculation (min) (ACUTE ONLY): 14 min  Charges:  $Therapeutic Activity: 8-22 mins                     Julaine Fusi PTA 09/07/19, 1:51 PM

## 2019-09-08 LAB — GLUCOSE, CAPILLARY: Glucose-Capillary: 114 mg/dL — ABNORMAL HIGH (ref 70–99)

## 2019-09-08 NOTE — Progress Notes (Signed)
[facility] at [place] NAME: Krystal Goodwin    MR#:  [id number]  DATE OF BIRTH:  January 26, 1934  SUBJECTIVE:  patient came in from [facility] after mechanical fall.  POD# 3 tolerating PT well . Patient intermittent pleasantly confused. No issues per RN  REVIEW OF SYSTEMS:   Review of Systems  Constitutional: Negative for chills, fever and weight loss.  HENT: Negative for ear discharge, ear pain and nosebleeds.   Eyes: Negative for blurred vision, pain and discharge.  Respiratory: Negative for sputum production, shortness of breath, wheezing and stridor.   Cardiovascular: Negative for chest pain, palpitations, orthopnea and PND.  Gastrointestinal: Negative for abdominal pain, diarrhea, nausea and vomiting.  Genitourinary: Negative for frequency and urgency.  Musculoskeletal: Positive for falls and joint pain. Negative for back pain.  Neurological: Positive for weakness. Negative for sensory change, speech change and focal weakness.  Psychiatric/Behavioral: Positive for hallucinations. Negative for depression. The patient is not nervous/anxious.    Tolerating Diet:yes Tolerating PT: rehab  DRUG ALLERGIES:   Allergies  Allergen Reactions  . Amoxicillin Itching    Breaks out in a rash  . Erythromycin Nausea And Vomiting  . Vicodin [Hydrocodone-Acetaminophen] Nausea And Vomiting    VITALS:  Blood pressure (!) 149/62, pulse 79, temperature 98.6 F (37 C), temperature source Oral, resp. rate 18, height 5\' 5"  (1.651 m), weight 90.7 kg, SpO2 94 %.  PHYSICAL EXAMINATION:   Physical Exam  GENERAL:  86 y.o.-year-old patient lying in the bed with no acute distress. Obese EYES: Pupils equal, round, reactive to light and accommodation. No scleral icterus.   HEENT: Head atraumatic, normocephalic. Oropharynx and nasopharynx clear.  NECK:  Supple, no jugular venous distention. No thyroid enlargement, no tenderness.  LUNGS: Normal  breath sounds bilaterally, no wheezing, rales, rhonchi. No use of accessory muscles of respiration.  CARDIOVASCULAR: S1, S2 normal. No murmurs, rubs, or gallops.  ABDOMEN: Soft, nontender, nondistended. Bowel sounds present. No organomegaly or mass.  EXTREMITIES: left lower extremity surgical dressing+ NEUROLOGIC: Cranial nerves II through XII are intact. No focal Motor or sensory deficits b/l.   PSYCHIATRIC:  patient is alert and oriented x 3.  SKIN: No obvious rash, lesion, or ulcer.   LABORATORY PANEL:  CBC Recent Labs  Lab 09/07/19 0453  WBC 11.9*  HGB 8.6*  HCT 26.0*  PLT 153    Chemistries  Recent Labs  Lab 09/07/19 0453  NA 134*  K 3.9  CL 101  CO2 19*  GLUCOSE 135*  BUN 30*  CREATININE 1.48*  CALCIUM 8.0*   Cardiac Enzymes No results for input(s): TROPONINI in the last 168 hours. RADIOLOGY:  No results found. ASSESSMENT AND PLAN:   Krystal Goodwin is a 86 y.o. female with medical history significant for type 2 diabetes mellitus, hypertension, hypothyroidism, chronic kidney disease stage III, heart block with pacer, and paroxysmal atrial fibrillation on Eliquis, now presenting to the emergency department with severe left hip and knee pain after fall at home.  Patient reports that she had been in her usual state of health but fell twice recently, is unsure of exactly how this happened, but denies any loss of consciousness or head injury.  She fell backwards last night and began to experience severe left hip pain  1. Left hip fracture  status post mechanical fall - Presents with left hip pain after a fall and is found to have femoral neck fracture  - Orthopedic surgery consult with  Dr Roland Rack appreciated  - POD#2 s/p Left hip unipolar hemiarthroplasty,  start PT. Ok to resume eliquis - Pre-op cardiology consultation with Dr. Nehemiah Massed appreciated - holding ARB due to soft bp -continue pain-control and supportive care   2. Paroxysmal atrial fibrillation  - Ok to resume  eliquis per Ortho and cardiology -continue beta-blockers  3. Type II DM -well controlled w/o complication - F5O was normal recently and she was going to be given a trial off of glipizide  -cont low-intensity SSI with Novolog if needed    4. CKD IIIb - SCr is 1.21 on admission, similar to priors  - Renally-dose medications, monitor    5. OSA  - Continue CPAP qHS   6. Hypertension  - BP at goal, hold losartan preoperatively   -cont BB   DVT prophylaxis: Eliquis  Code Status: Full  Family Communication: Discussed with patient  and daughter in the room Disposition Plan:  Patient is from: Home  Anticipated d/c is to: [facility] Anticipated d/c date is: 09/08/19 Patient currently: POD# 3 awaiting insurance authorization Consults called: Orthopedic surgery consulted by ED physician  Admission status: Inpatient    TOC for d/c planning  TOTAL TIME TAKING CARE OF THIS PATIENT: *20* minutes.  >50% time spent on counselling and coordination of care  Note: This dictation was prepared with Dragon dictation along with smaller phrase technology. Any transcriptional errors that result from this process are unintentional.  Fritzi Mandes M.D    [facility]   CC: Primary care physician; Derinda Late, MDPatient ID: Krystal Goodwin, female   DOB: Jul 28, 1933, 86 y.o.   MRN: [id number]

## 2019-09-08 NOTE — Progress Notes (Signed)
Subjective: 3 Days Post-Op Procedure(s) (LRB): ARTHROPLASTY BIPOLAR HIP (HEMIARTHROPLASTY) (Left) Patient reports pain as mild to the left hip, reports moderate knee pain. Patient is well, and has had no acute complaints or problems PT and care management to assist with discharge planning. Negative for chest pain and shortness of breath Fever: no Gastrointestinal:Negative for nausea and vomiting  Objective: Vital signs in last 24 hours: Temp:  [98.2 F (36.8 C)-98.6 F (37 C)] 98.2 F (36.8 C) (07/31 2353) Pulse Rate:  [83-89] 84 (07/31 2353) Resp:  [18] 18 (07/31 2353) BP: (106-134)/(41-50) 134/50 (07/31 2353) SpO2:  [92 %-94 %] 92 % (07/31 2353)  Intake/Output from previous day:  Intake/Output Summary (Last 24 hours) at 09/08/2019 0747 Last data filed at 09/08/2019 0334 Gross per 24 hour  Intake --  Output 600 ml  Net -600 ml    Intake/Output this shift: No intake/output data recorded.  Labs: Recent Labs    09/06/19 0438 09/07/19 0453  HGB 8.7* 8.6*   Recent Labs    09/06/19 0438 09/07/19 0453  WBC 11.8* 11.9*  RBC 3.26* 3.07*  HCT 29.3* 26.0*  PLT 182 153   Recent Labs    09/06/19 0438 09/07/19 0453  NA 136 134*  K 4.1 3.9  CL 106 101  CO2 22 19*  BUN 27* 30*  CREATININE 1.41* 1.48*  GLUCOSE 139* 135*  CALCIUM 7.9* 8.0*   No results for input(s): LABPT, INR in the last 72 hours.   EXAM General - Patient is Alert, Appropriate and Oriented Extremity - ABD soft Neurovascular intact Sensation intact distally Intact pulses distally Dorsiflexion/Plantar flexion intact Incision: dressing C/D/I No cellulitis present Dressing/Incision - clean, dry, no drainage Motor Function - intact, moving foot and toes well on exam.  Abdomen is soft with palpation.  Past Medical History:  Diagnosis Date  . Anemia    iron def.anemia  . Aortic stenosis, severe   . Asthma   . Chest pain   . Colon cancer ([place])   . Complication of anesthesia    nausea  .  Diabetes mellitus without complication ([place])   . Dyslipidemia   . Dyspnea   . Gout, joint   . H/O postmenopausal osteoporosis   . Hematuria   . History of palpitations   . Hx of colonic polyps   . Hypertension   . Hypothyroidism   . IBS (irritable bowel syndrome)   . Schatzki's ring   . Sleep apnea     Assessment/Plan: 3 Days Post-Op Procedure(s) (LRB): ARTHROPLASTY BIPOLAR HIP (HEMIARTHROPLASTY) (Left) Principal Problem:   Closed fracture of left hip (HCC) Active Problems:   Diabetes mellitus type II, non insulin dependent (HCC)   Hypothyroidism   CKD (chronic kidney disease), stage III   Hypertension   Sleep apnea   PAF (paroxysmal atrial fibrillation) ([place])   Fall  Estimated body mass index is 33.28 kg/m as calculated from the following:   Height as of this encounter: 5\' 5"  (1.651 m).   Weight as of this encounter: 90.7 kg. Advance diet Up with therapy D/C IV fluids when tolerating oral intake.  Labs reviewed this AM. Continue working on BM at this time. Eliquis for DVT prophylaxis Plan for discharge with Norco in addition to tramadol as needed for pain.  Upon discharge from hospital, if she discharges to SNF can have the rehab facility remove staples on 09/19/19 and follow-up with [facility] orthopaedics in 6 weeks. If discharge home follow-up in the office in two weeks for staple removal.  DVT Prophylaxis - Foot Pumps, TED hose and Eliquis Weight-Bearing as tolerated to left leg  Reche Dixon, PA-C [facility] Orthopaedic Surgery 09/08/2019, 7:47 AM

## 2019-09-08 NOTE — Plan of Care (Signed)
  Problem: Education: Goal: Knowledge of General Education information will improve Description: Including pain rating scale, medication(s)/side effects and non-pharmacologic comfort measures Outcome: Progressing   Problem: Pain Managment: Goal: General experience of comfort will improve Outcome: Progressing   Problem: Safety: Goal: Ability to remain free from injury will improve Outcome: Progressing   

## 2019-09-08 NOTE — Progress Notes (Signed)
Physical Therapy Treatment Patient Details Name: Krystal Goodwin MRN: [id number] DOB: Jun 28, 1933 Today's Date: 09/08/2019    History of Present Illness Pt is 86 y.o. female s/p L hip hemiarthroplasty on 09/05/19.  PMH includes: HTN, former smoker, sleep apnea, asthma, SOB, renal disease, diabetes, hypothyroidism, anemia, and arthritis.    PT Comments    Patient received in bed. Reluctant to participate in PT worried about the pain. Patient premedicated prior to visit. Patient requires max assist for supine to sit and rolling in bed. She requires increased time due to fear of movement. Patient requires +2 assist for sit to stand from elevated surface. She is unable to weight shift to ambulate to recliner. She was returned to bed with +2 max assist. She will continue to benefit from skilled PT while here to improve functional mobility and strength.        Follow Up Recommendations  SNF;Supervision/Assistance - 24 hour     Equipment Recommendations  Other (comment) (to be determined)    Recommendations for Other Services       Precautions / Restrictions Precautions Precautions: Posterior Hip Precaution Booklet Issued: Yes (comment) Restrictions Weight Bearing Restrictions: Yes LLE Weight Bearing: Weight bearing as tolerated    Mobility  Bed Mobility Overal bed mobility: Needs Assistance Bed Mobility: Supine to Sit;Sit to Supine Rolling: Max assist;+2 for physical assistance   Supine to sit: Max assist Sit to supine: Max assist;+2 for physical assistance   General bed mobility comments: Pt required max assist +2 to return to supine form EOB short sit. pain most limiting factor throughout session  Transfers Overall transfer level: Needs assistance Equipment used: Rolling walker (2 wheeled) Transfers: Sit to/from Stand Sit to Stand: Max assist;+2 physical assistance         General transfer comment: Patient requires bed elevated fairly high to attempt standing. Required +2  assist to power up to stand.  Ambulation/Gait             General Gait Details: unable to advance either LE to take steps   Stairs             Wheelchair Mobility    Modified Rankin (Stroke Patients Only)       Balance Overall balance assessment: Needs assistance Sitting-balance support: Feet supported Sitting balance-Leahy Scale: Good Sitting balance - Comments: able to maintain sitting without assistance   Standing balance support: Bilateral upper extremity supported;During functional activity Standing balance-Leahy Scale: Poor Standing balance comment: heavy reliance on RW for support, external assist                            Cognition Arousal/Alertness: Awake/alert Behavior During Therapy: WFL for tasks assessed/performed Overall Cognitive Status: History of cognitive impairments - at baseline                                 General Comments: Pt is alert but disoriented. was able to follow commands consistently throughout with increased time to process. no family present to discuss baseline cognition but per notes has baseline deficits      Exercises Total Joint Exercises Ankle Circles/Pumps: AROM;10 reps;Both Heel Slides: AAROM;5 reps;Left Hip ABduction/ADduction: AAROM;5 reps;Both    General Comments        Pertinent Vitals/Pain Pain Assessment: Faces Faces Pain Scale: Hurts even more Pain Location: L Hip Pain Descriptors / Indicators: Aching;Grimacing;Sharp;Guarding Pain Intervention(s): Monitored during  session;Premedicated before session;Repositioned    Home Living                      Prior Function            PT Goals (current goals can now be found in the care plan section) Acute Rehab PT Goals Patient Stated Goal: for it not to hurt PT Goal Formulation: With patient/family Time For Goal Achievement: 09/20/19 Potential to Achieve Goals: Fair Progress towards PT goals: Not progressing toward  goals - comment (pain limited)    Frequency    BID      PT Plan Current plan remains appropriate    Co-evaluation              AM-PAC PT "6 Clicks" Mobility   Outcome Measure  Help needed turning from your back to your side while in a flat bed without using bedrails?: A Lot Help needed moving from lying on your back to sitting on the side of a flat bed without using bedrails?: A Lot Help needed moving to and from a bed to a chair (including a wheelchair)?: Total Help needed standing up from a chair using your arms (e.g., wheelchair or bedside chair)?: A Lot Help needed to walk in hospital room?: Total Help needed climbing 3-5 steps with a railing? : Total 6 Click Score: 9    End of Session Equipment Utilized During Treatment: Gait belt Activity Tolerance: Patient limited by pain Patient left: in bed;with call bell/phone within reach;with bed alarm set;with nursing/sitter in room Nurse Communication: Mobility status PT Visit Diagnosis: Unsteadiness on feet (R26.81);Other abnormalities of gait and mobility (R26.89);Repeated falls (R29.6);Muscle weakness (generalized) (M62.81);History of falling (Z91.81);Difficulty in walking, not elsewhere classified (R26.2);Pain Pain - Right/Left: Left Pain - part of body: Hip;Knee     Time: 9678-9381 PT Time Calculation (min) (ACUTE ONLY): 28 min  Charges:  $Therapeutic Exercise: 8-22 mins $Therapeutic Activity: 8-22 mins                     Watson Robarge, PT, GCS 09/08/19,11:03 AM

## 2019-09-09 LAB — SURGICAL PATHOLOGY

## 2019-09-09 LAB — SARS CORONAVIRUS 2 BY RT PCR (HOSPITAL ORDER, PERFORMED IN ~~LOC~~ HOSPITAL LAB): SARS Coronavirus 2: NEGATIVE

## 2019-09-09 LAB — GLUCOSE, CAPILLARY: Glucose-Capillary: 114 mg/dL — ABNORMAL HIGH (ref 70–99)

## 2019-09-09 MED ORDER — ALPRAZOLAM 0.25 MG PO TABS: 0.2500 mg | ORAL_TABLET | ORAL | 0 refills | Status: DC | PRN

## 2019-09-09 MED ORDER — LOSARTAN POTASSIUM 25 MG PO TABS: 25.0000 mg | ORAL_TABLET | Freq: Every day | ORAL | 0 refills | Status: DC

## 2019-09-09 NOTE — Care Management Important Message (Signed)
Important Message  Patient Details  Name: Krystal Goodwin MRN: [id number] Date of Birth: Feb 09, 1933   Medicare Important Message Given:  Yes     Su Hilt, RN 09/09/2019, 10:23 AM

## 2019-09-09 NOTE — TOC Progression Note (Signed)
Transition of Care [facility]) - Progression Note    Patient Details  Name: Krystal Goodwin MRN: [id number] Date of Birth: 1933/02/15  [facility]) CM/SW Contact  Su Hilt, RN Phone Number: 09/09/2019, 10:33 AM  Clinical Narrative:   Checked the status of the insurance auth, [employer] Ref number [id number]         Expected Discharge Plan and Services           Expected Discharge Date: 09/09/19                                     Social Determinants of Health (SDOH) Interventions    Readmission Risk Interventions No flowsheet data found.

## 2019-09-09 NOTE — Discharge Summary (Signed)
[facility] NAME: Krystal Goodwin    MR#:  [id number]  DATE OF BIRTH:  1933/07/27  DATE OF ADMISSION:  09/04/2019 ADMITTING PHYSICIAN: Vianne Bulls, MD  DATE OF DISCHARGE:   PRIMARY CARE PHYSICIAN: Derinda Late, MD    ADMISSION DIAGNOSIS:  Fall [W19.XXXA] Closed fracture of left hip, initial encounter ([place]) [S72.002A] Closed left hip fracture, initial encounter ([place]) [S72.002A]  DISCHARGE DIAGNOSIS:  close left hip fracture status post left hip hemi arthroplasty  SECONDARY DIAGNOSIS:   Past Medical History:  Diagnosis Date  . Anemia    iron def.anemia  . Aortic stenosis, severe   . Asthma   . Chest pain   . Colon cancer ([place])   . Complication of anesthesia    nausea  . Diabetes mellitus without complication ([place])   . Dyslipidemia   . Dyspnea   . Gout, joint   . H/O postmenopausal osteoporosis   . Hematuria   . History of palpitations   . Hx of colonic polyps   . Hypertension   . Hypothyroidism   . IBS (irritable bowel syndrome)   . Schatzki's ring   . Sleep apnea     HOSPITAL COURSE:  Krystal Goodwin a 86 y.o.femalewith medical history significant fortype 2 diabetes mellitus, hypertension, hypothyroidism, chronic kidney disease stage III, heart block with pacer, and paroxysmal atrial fibrillation on Eliquis, now presenting to the emergency department with severe left hip and knee pain after fall at home. Patient reports that she had been in her usual state of health but fell twice recently, is unsure of exactly how this happened, but denies any loss of consciousness or head injury. She fell backwards last night and began to experience severe left hip pain  1.Left hip fracture status post mechanical fall -Presents with left hip pain after a fall and is found to have femoral neck fracture -Orthopedic surgery consult with Dr Roland Rack appreciated  - POD# 4 s/p Lefthip unipolar hemiarthroplasty, tolerating   PT. Ok to resume eliquis - Pre-op cardiology consultation with Dr. Nehemiah Massed appreciated - resume ARB at 25 mg due to soft bp (home dose was 100 mg qd)  -continue pain-control and supportive care  2.Paroxysmal atrial fibrillation -Ok to resume eliquis per Ortho and cardiology -continue beta-blockers  3.Type II DM-well controlled w/o complication -O7S was normal recently and she was going to be given a trial off of glipizide -cont low-intensity SSI with Novolog if needed -sugars stable--d/c glipizide  4.CKD IIIb -SCr is 1.21 on admission, similar to priors -Renally-dose medications, monitor  5.OSA -Continue CPAP qHS  6.Hypertension -BP at goal -cont BB and low dose losartan   DVT prophylaxis:Eliquis  Code Status:Full Family Communication:Discussed with patient and daughter in the room Disposition Plan: Patient is from:Home Anticipated d/c is to:TBD--[facility] Anticipated d/c date is:09/08/19 Patient currently:POD# 4 awaiting insurance authorization Consults called:Orthopedic surgery consulted by ED physician Admission status:Inpatient  CONSULTS OBTAINED:  Treatment Team:  Poggi, Marshall Cork, MD Corey Skains, MD  DRUG ALLERGIES:   Allergies  Allergen Reactions  . Amoxicillin Itching    Breaks out in a rash  . Erythromycin Nausea And Vomiting  . Vicodin [Hydrocodone-Acetaminophen] Nausea And Vomiting    DISCHARGE MEDICATIONS:   Allergies as of 09/09/2019      Reactions   Amoxicillin Itching   Breaks out in a rash   Erythromycin Nausea And Vomiting   Vicodin [hydrocodone-acetaminophen] Nausea And Vomiting      Medication  List    STOP taking these medications   glipiZIDE 5 MG 24 hr tablet Commonly known as: GLUCOTROL XL     TAKE these medications   ALPRAZolam 0.25 MG tablet Commonly known as: XANAX Take 1-2 tablets (0.25-0.5 mg total) by mouth every 4 (four) hours as needed for anxiety or sleep.    atorvastatin 10 MG tablet Commonly known as: LIPITOR Take 10 mg by mouth daily.   citalopram 20 MG tablet Commonly known as: CELEXA Take 20 mg by mouth daily.   Eliquis 5 MG Tabs tablet Generic drug: apixaban Take 5 mg by mouth 2 (two) times daily.   ferrous sulfate 325 (65 FE) MG tablet Take 325 mg by mouth daily with breakfast.   furosemide 40 MG tablet Commonly known as: LASIX Take 40 mg by mouth.   HYDROcodone-acetaminophen 5-325 MG tablet Commonly known as: NORCO/VICODIN Take 1 tablet by mouth every 4 (four) hours as needed for moderate pain.   levothyroxine 100 MCG tablet Commonly known as: SYNTHROID Take 100 mcg by mouth daily before breakfast.   losartan 25 MG tablet Commonly known as: COZAAR Take 1 tablet (25 mg total) by mouth daily. What changed:   medication strength  how much to take   melatonin-pyridoxine 1-10 MG Tabs Take by mouth at bedtime as needed.   metoprolol succinate 25 MG 24 hr tablet Commonly known as: TOPROL-XL Take 25 mg by mouth daily.   multivitamin tablet Take 1 tablet by mouth daily.   pantoprazole 40 MG tablet Commonly known as: PROTONIX Take 40 mg by mouth daily.   traMADol 50 MG tablet Commonly known as: ULTRAM Take 1 tablet (50 mg total) by mouth every 6 (six) hours as needed for moderate pain.            Discharge Care Instructions  (From admission, onward)         Start     Ordered   09/09/19 0000  Discharge wound care:       Comments: Per Orthopedic instrcution   09/09/19 0827          If you experience worsening of your admission symptoms, develop shortness of breath, life threatening emergency, suicidal or homicidal thoughts you must seek medical attention immediately by calling 911 or calling your MD immediately  if symptoms less severe.  You Must read complete instructions/literature along with all the possible adverse reactions/side effects for all the Medicines you take and that have been  prescribed to you. Take any new Medicines after you have completely understood and accept all the possible adverse reactions/side effects.   Please note  You were cared for by a hospitalist during your hospital stay. If you have any questions about your discharge medications or the care you received while you were in the hospital after you are discharged, you can call the unit and asked to speak with the hospitalist on call if the hospitalist that took care of you is not available. Once you are discharged, your primary care physician will handle any further medical issues. Please note that NO REFILLS for any discharge medications will be authorized once you are discharged, as it is imperative that you return to your primary care physician (or establish a relationship with a primary care physician if you do not have one) for your aftercare needs so that they can reassess your need for medications and monitor your lab values. Today   SUBJECTIVE   No new complaints Feels like she will have BM today  VITAL SIGNS:  Blood pressure (!) 128/47, pulse 78, temperature 98.9 F (37.2 C), temperature source Oral, resp. rate 16, height 5\' 5"  (1.651 m), weight 90.7 kg, SpO2 96 %.  I/O:    Intake/Output Summary (Last 24 hours) at 09/09/2019 0828 Last data filed at 09/09/2019 0504 Gross per 24 hour  Intake 460 ml  Output 800 ml  Net -340 ml    PHYSICAL EXAMINATION:  GENERAL:  86 y.o.-year-old patient lying in the bed with no acute distress. obese EYES: Pupils equal, round, reactive to light and accommodation. No scleral icterus.  HEENT: Head atraumatic, normocephalic. Oropharynx and nasopharynx clear.  NECK:  Supple, no jugular venous distention. No thyroid enlargement, no tenderness.  LUNGS: Normal breath sounds bilaterally, no wheezing, rales,rhonchi or crepitation. No use of accessory muscles of respiration.  CARDIOVASCULAR: S1, S2 normal. No murmurs, rubs, or gallops.  ABDOMEN: Soft, non-tender,  non-distended. Bowel sounds present. No organomegaly or mass.  EXTREMITIES: No pedal edema, cyanosis, or clubbing.  NEUROLOGIC: Cranial nerves II through XII are intact. Muscle strength 5/5 in all extremities. Sensation intact. Gait not checked.  PSYCHIATRIC: The patient is alert and oriented x 3.  SKIN: No obvious rash, lesion, or ulcer.   DATA REVIEW:   CBC  Recent Labs  Lab 09/07/19 0453  WBC 11.9*  HGB 8.6*  HCT 26.0*  PLT 153    Chemistries  Recent Labs  Lab 09/07/19 0453  NA 134*  K 3.9  CL 101  CO2 19*  GLUCOSE 135*  BUN 30*  CREATININE 1.48*  CALCIUM 8.0*    Microbiology Results   Recent Results (from the past 240 hour(s))  SARS Coronavirus 2 by RT PCR (hospital order, performed in [facility] hospital lab) Nasopharyngeal Nasopharyngeal Swab     Status: None   Collection Time: 09/04/19 11:20 PM   Specimen: Nasopharyngeal Swab  Result Value Ref Range Status   SARS Coronavirus 2 NEGATIVE NEGATIVE Final    Comment: (NOTE) SARS-CoV-2 target nucleic acids are NOT DETECTED.  The SARS-CoV-2 RNA is generally detectable in upper and lower respiratory specimens during the acute phase of infection. The lowest concentration of SARS-CoV-2 viral copies this assay can detect is 250 copies / mL. A negative result does not preclude SARS-CoV-2 infection and should not be used as the sole basis for treatment or other patient management decisions.  A negative result may occur with improper specimen collection / handling, submission of specimen other than nasopharyngeal swab, presence of viral mutation(s) within the areas targeted by this assay, and inadequate number of viral copies (<250 copies / mL). A negative result must be combined with clinical observations, patient history, and epidemiological information.  Fact Sheet for Patients:   StrictlyIdeas.no  Fact Sheet for Healthcare Providers: BankingDealers.co.za  This  test is not yet approved or  cleared by the [place] FDA and has been authorized for detection and/or diagnosis of SARS-CoV-2 by FDA under an Emergency Use Authorization (EUA).  This EUA will remain in effect (meaning this test can be used) for the duration of the COVID-19 declaration under Section 564(b)(1) of the Act, 21 U.S.C. section 360bbb-3(b)(1), unless the authorization is terminated or revoked sooner.  Performed at [facility], [address] [place], [place] [address]     RADIOLOGY:  No results found.   CODE STATUS:     Code Status Orders  (From admission, onward)         Start     Ordered   09/05/19 0049  Full code  Continuous        09/05/19 0049        Code Status History    This patient has a current code status but no historical code status.   Advance Care Planning Activity       TOTAL TIME TAKING CARE OF THIS PATIENT: 35 minutes.    Fritzi Mandes M.D  [facility]    CC: Primary care physician; Derinda Late, MD

## 2019-09-09 NOTE — Discharge Instructions (Signed)
Instructions after Hip Replacement     Krystal Goodwin, M.D.  Raquel Curtina Grills, PA-C     Dept. of [facility]  [place], [place]  [address]  Phone: [phone]   Fax: [phone]    DIET: . Drink plenty of non-alcoholic fluids. . Resume your normal diet. Include foods high in fiber.  ACTIVITY:  . You may use crutches or a walker with weight-bearing as tolerated, unless instructed otherwise. . You may be weaned off of the walker or crutches by your Physical Therapist.  . Do NOT reach below the level of your knees or cross your legs until allowed.    . Continue doing gentle exercises. Exercising will reduce the pain and swelling, increase motion, and prevent muscle weakness.   . Please continue to use the TED compression stockings for 6 weeks. You may remove the stockings at night, but should reapply them in the morning. . Do not drive or operate any equipment until instructed.  WOUND CARE:  . Continue to use ice packs periodically to reduce pain and swelling. Marland Kitchen Keep the incision clean and dry. . You may bathe or shower after the staples are removed at the first office visit following surgery.  MEDICATIONS: . Dennis Bast may resume your regular medications. . Please take the pain medication as prescribed on the medication. . Do not take pain medication on an empty stomach. . You have been given a prescription for a blood thinner to prevent blood clots. Please take the medication as instructed. (NOTE: After completing a 2 week course of Lovenox, take one Enteric-coated aspirin once a day.) . Pain medications and iron supplements can cause constipation. Use a stool softener (Senokot or Colace) on a daily basis and a laxative (dulcolax or miralax) as needed. . Do not drive or drink alcoholic beverages when taking pain medications.  CALL THE OFFICE FOR: . Temperature above 101 degrees . Excessive bleeding or drainage on the  dressing. . Excessive swelling, coldness, or paleness of the toes. . Persistent nausea and vomiting.  FOLLOW-UP:  . You should have an appointment to return to the office in 6 weeks after surgery after the staples are removed by the rehab facility. . Arrangements have been made for continuation of Physical Therapy (either home therapy or outpatient therapy).

## 2019-09-09 NOTE — Care Management Important Message (Signed)
Important Message  Patient Details  Name: Krystal Goodwin MRN: [id number] Date of Birth: 1933/06/24   Medicare Important Message Given:  Yes     [name] 09/09/2019, 10:04 AM

## 2019-09-09 NOTE — TOC Progression Note (Signed)
Transition of Care [facility]) - Progression Note    Patient Details  Name: Krystal Goodwin MRN: [id number] Date of Birth: [birth year]  Transition [facility] Care [facility]) CM/SW Contact  Su Hilt, RN Phone Number: 09/09/2019, 2:50 PM  Clinical Narrative:    Received a call from [facility] requesting more inforamtion and verbally read today's PT notes and progress.  She stated that they are working on it and will let us know once approved. I notified the physician that it will most likely be tomorrow before she can DC due to the time of day        Expected Discharge Plan and Services           Expected Discharge Date: 09/09/19                                     Social Determinants of Health (SDOH) Interventions    Readmission Risk Interventions No flowsheet data found.

## 2019-09-09 NOTE — Progress Notes (Signed)
Patient ID: Krystal Goodwin, female   DOB: 1933-08-17, 86 y.o.   MRN: [id number]  Per case manager discharge has been canceled since patient's insurer requires more documentation and paperwork. Will discharge once insurance authorization approved.

## 2019-09-09 NOTE — Progress Notes (Signed)
Physical Therapy Treatment Patient Details Name: Krystal Goodwin MRN: [id number] DOB: 02-01-1934 Today's Date: 09/09/2019    History of Present Illness Pt is 86 y.o. female s/p L hip hemiarthroplasty on 09/05/19.  PMH includes: HTN, former smoker, sleep apnea, asthma, SOB, renal disease, diabetes, hypothyroidism, anemia, and arthritis.    PT Comments    Pt ready to return to bed.  Stood with mod a x 2.  Care for inc BM in standing.  Overall poor posture leaning on walker front and reaching for my shirt to hold despite cues for hands to remain on RW.  She is able to turn towards bed with max cues and min/mod a x 2 for support.  Very slow and distracted during transfer talking about unrelated topics with cues to stay on task.  Returned to supine with mod/max a x 2.   Follow Up Recommendations  SNF;Supervision/Assistance - 24 hour     Equipment Recommendations       Recommendations for Other Services       Precautions / Restrictions Precautions Precautions: Posterior Hip Precaution Booklet Issued: Yes (comment) Restrictions Weight Bearing Restrictions: Yes LLE Weight Bearing: Weight bearing as tolerated    Mobility  Bed Mobility Overal bed mobility: Needs Assistance Bed Mobility: Sit to Supine Rolling: Mod assist     Sit to supine: Mod assist;Max assist;+2 for physical assistance   General bed mobility comments: time and cues allow for improved mobility today  Transfers Overall transfer level: Needs assistance Equipment used: Rolling walker (2 wheeled) Transfers: Sit to/from Stand Sit to Stand: Min assist;Mod assist;+2 physical assistance            Ambulation/Gait Ambulation/Gait assistance: Mod assist;+2 physical assistance Gait Distance (Feet): 3 Feet Assistive device: Rolling walker (2 wheeled) Gait Pattern/deviations: Step-to pattern;Antalgic;Decreased step length - right;Decreased stance time - left;Decreased stride length;Trunk flexed Gait velocity:  decreased.   General Gait Details: able to turn fully this session to left, increased time and max cues   Stairs             Wheelchair Mobility    Modified Rankin (Stroke Patients Only)       Balance Overall balance assessment: Needs assistance Sitting-balance support: Feet supported Sitting balance-Leahy Scale: Good Sitting balance - Comments: able to maintain sitting without assistance   Standing balance support: Bilateral upper extremity supported;During functional activity Standing balance-Leahy Scale: Poor Standing balance comment: heavy reliance on RW for support, external assist                            Cognition Arousal/Alertness: Awake/alert Behavior During Therapy: WFL for tasks assessed/performed Overall Cognitive Status: History of cognitive impairments - at baseline                                 General Comments: very talkative and difficulty to keep on task      Exercises Total Joint Exercises Ankle Circles/Pumps: AROM;Strengthening;10 reps;Supine Quad Sets: AROM;Strengthening;10 reps;Supine Gluteal Sets: AROM;Strengthening;10 reps;Supine Hip ABduction/ADduction: AROM;Strengthening;10 reps;Supine (Pt performed adduction within precaution limits.) Straight Leg Raises: AROM;Strengthening;Right;10 reps;AAROM;Left    General Comments        Pertinent Vitals/Pain Pain Assessment: Faces Faces Pain Scale: Hurts little more Pain Location: L Hip Pain Descriptors / Indicators: Aching;Grimacing;Sharp;Guarding Pain Intervention(s): Limited activity within patient's tolerance;Monitored during session;Repositioned    Home Living  Prior Function            PT Goals (current goals can now be found in the care plan section) Progress towards PT goals: Progressing toward goals    Frequency    BID      PT Plan Current plan remains appropriate    Co-evaluation              AM-PAC  PT "6 Clicks" Mobility   Outcome Measure  Help needed turning from your back to your side while in a flat bed without using bedrails?: A Lot Help needed moving from lying on your back to sitting on the side of a flat bed without using bedrails?: A Lot Help needed moving to and from a bed to a chair (including a wheelchair)?: A Lot Help needed standing up from a chair using your arms (e.g., wheelchair or bedside chair)?: A Lot Help needed to walk in hospital room?: Total Help needed climbing 3-5 steps with a railing? : Total 6 Click Score: 10    End of Session Equipment Utilized During Treatment: Gait belt Activity Tolerance: Patient tolerated treatment well Patient left: in chair;with call bell/phone within reach;with chair alarm set Nurse Communication: Mobility status Pain - Right/Left: Left Pain - part of body: Hip;Knee     Time: 2836-6294 PT Time Calculation (min) (ACUTE ONLY): 15 min  Charges:  $Therapeutic Exercise: 8-22 mins $Therapeutic Activity: 8-22 mins                    Chesley Noon, PTA 09/09/19, 1:55 PM

## 2019-09-09 NOTE — Progress Notes (Signed)
Subjective: 4 Days Post-Op Procedure(s) (LRB): ARTHROPLASTY BIPOLAR HIP (HEMIARTHROPLASTY) (Left) Patient reports pain as mild to the left hip. Patient is well, and has had no acute complaints or problems Plan for discharge to SNF today Negative for chest pain and shortness of breath Fever: no Gastrointestinal:Negative for nausea and vomiting Patient will need to have a BM prior to discharge to rehab.  Objective: Vital signs in last 24 hours: Temp:  [98.5 F (36.9 C)-98.9 F (37.2 C)] 98.9 F (37.2 C) (08/02 0725) Pulse Rate:  [75-84] 78 (08/02 0725) Resp:  [16-18] 16 (08/02 0725) BP: (123-147)/(46-62) 128/47 (08/02 0725) SpO2:  [94 %-97 %] 96 % (08/02 0725)  Intake/Output from previous day:  Intake/Output Summary (Last 24 hours) at 09/09/2019 0750 Last data filed at 09/09/2019 0504 Gross per 24 hour  Intake 460 ml  Output 800 ml  Net -340 ml    Intake/Output this shift: No intake/output data recorded.  Labs: Recent Labs    09/07/19 0453  HGB 8.6*   Recent Labs    09/07/19 0453  WBC 11.9*  RBC 3.07*  HCT 26.0*  PLT 153   Recent Labs    09/07/19 0453  NA 134*  K 3.9  CL 101  CO2 19*  BUN 30*  CREATININE 1.48*  GLUCOSE 135*  CALCIUM 8.0*   No results for input(s): LABPT, INR in the last 72 hours.   EXAM General - Patient is Alert, Appropriate and Oriented Extremity - ABD soft Neurovascular intact Sensation intact distally Intact pulses distally Dorsiflexion/Plantar flexion intact No cellulitis present Dressing/Incision - Moderate bloody drainage to the left hip this AM. Motor Function - intact, moving foot and toes well on exam.  Abdomen is soft with palpation.  Intact bowel sounds.  Past Medical History:  Diagnosis Date  . Anemia    iron def.anemia  . Aortic stenosis, severe   . Asthma   . Chest pain   . Colon cancer ([place])   . Complication of anesthesia    nausea  . Diabetes mellitus without complication ([place])   . Dyslipidemia   .  Dyspnea   . Gout, joint   . H/O postmenopausal osteoporosis   . Hematuria   . History of palpitations   . Hx of colonic polyps   . Hypertension   . Hypothyroidism   . IBS (irritable bowel syndrome)   . Schatzki's ring   . Sleep apnea     Assessment/Plan: 4 Days Post-Op Procedure(s) (LRB): ARTHROPLASTY BIPOLAR HIP (HEMIARTHROPLASTY) (Left) Principal Problem:   Closed fracture of left hip (HCC) Active Problems:   Diabetes mellitus type II, non insulin dependent (HCC)   Hypothyroidism   CKD (chronic kidney disease), stage III   Hypertension   Sleep apnea   PAF (paroxysmal atrial fibrillation) ([place])   Fall  Estimated body mass index is 33.28 kg/m as calculated from the following:   Height as of this encounter: 5\' 5"  (1.651 m).   Weight as of this encounter: 90.7 kg. Advance diet Up with therapy D/C IV fluids when tolerating oral intake.  Continue working on BM at this time. Eliquis for DVT prophylaxis Plan for discharge with Norco in addition to tramadol as needed for pain.  Upon discharge from hospital, SNF can removed staples on 09/19/19. Follow-up with [facility] in 6 weeks for skin check and x-rays of the left hip.  DVT Prophylaxis - Foot Pumps, TED hose and Eliquis Weight-Bearing as tolerated to left leg  J. Cameron Proud, PA-C [facility] 09/09/2019, 7:50 AM

## 2019-09-09 NOTE — Progress Notes (Addendum)
Physical Therapy Treatment Patient Details Name: Krystal Goodwin MRN: [id number] DOB: 1934-02-06 Today's Date: 09/09/2019    History of Present Illness Pt is 86 y.o. female s/p L hip hemiarthroplasty on 09/05/19.  PMH includes: HTN, former smoker, sleep apnea, asthma, SOB, renal disease, diabetes, hypothyroidism, anemia, and arthritis.    PT Comments    Participated in exercises as described below.  To EOB with mod a x 1 for LLE assist and to get trunk off of bed but overall does much better today with time and cues.  Sitting with supervision EOB.  Stood with mod a x 2 for care as she had a BM during transition.  After short seated rest, she is able to stand and transfer with RW and mod a x 2.  Initially slides feet on floor but gets "Stuck" mid turn and needs assist to complete. Poor hand placements, taking hands off walker during transfer to hold onto staff or bed.  Verbal and tactile cues to replace hands on walker.  In chair after session.   Follow Up Recommendations  SNF;Supervision/Assistance - 24 hour     Equipment Recommendations       Recommendations for Other Services       Precautions / Restrictions Precautions Precautions: Posterior Hip Precaution Booklet Issued: Yes (comment) Restrictions Weight Bearing Restrictions: Yes LLE Weight Bearing: Weight bearing as tolerated    Mobility  Bed Mobility Overal bed mobility: Needs Assistance Bed Mobility: Supine to Sit Rolling: Mod assist         General bed mobility comments: time and cues allow for improved mobility today  Transfers Overall transfer level: Needs assistance Equipment used: Rolling walker (2 wheeled) Transfers: Sit to/from Stand Sit to Stand: Min assist;Mod assist;+2 physical assistance            Ambulation/Gait Ambulation/Gait assistance: Mod assist;+2 physical assistance   Assistive device: Rolling walker (2 wheeled) Gait Pattern/deviations: Step-to pattern;Antalgic;Decreased step length -  right;Decreased stance time - left;Decreased stride length;Trunk flexed Gait velocity: decreased.   General Gait Details: poor steps, shuffles feet but stops 1/2 way through turn.  needs assist to complete.   Stairs             Wheelchair Mobility    Modified Rankin (Stroke Patients Only)       Balance Overall balance assessment: Needs assistance Sitting-balance support: Feet supported Sitting balance-Leahy Scale: Good Sitting balance - Comments: able to maintain sitting without assistance   Standing balance support: Bilateral upper extremity supported;During functional activity Standing balance-Leahy Scale: Poor Standing balance comment: heavy reliance on RW for support, external assist                            Cognition Arousal/Alertness: Awake/alert Behavior During Therapy: WFL for tasks assessed/performed Overall Cognitive Status: History of cognitive impairments - at baseline                                 General Comments: Pt is alert but disoriented. was able to follow commands consistently throughout with increased time to process. no family present to discuss baseline cognition but per notes has baseline deficits      Exercises Total Joint Exercises Ankle Circles/Pumps: AROM;Strengthening;10 reps;Supine Quad Sets: AROM;Strengthening;10 reps;Supine Gluteal Sets: AROM;Strengthening;10 reps;Supine Hip ABduction/ADduction: AROM;Strengthening;10 reps;Supine (Pt performed adduction within precaution limits.) Straight Leg Raises: AROM;Strengthening;Right;10 reps;AAROM;Left    General Comments  Pertinent Vitals/Pain Pain Assessment: Faces Faces Pain Scale: Hurts even more Pain Location: L Hip Pain Descriptors / Indicators: Aching;Grimacing;Sharp;Guarding Pain Intervention(s): Limited activity within patient's tolerance;Monitored during session;Repositioned;Premedicated before session    Home Living                       Prior Function            PT Goals (current goals can now be found in the care plan section) Progress towards PT goals: Progressing toward goals    Frequency    BID      PT Plan Current plan remains appropriate    Co-evaluation              AM-PAC PT "6 Clicks" Mobility   Outcome Measure  Help needed turning from your back to your side while in a flat bed without using bedrails?: A Lot Help needed moving from lying on your back to sitting on the side of a flat bed without using bedrails?: A Lot Help needed moving to and from a bed to a chair (including a wheelchair)?: A Lot Help needed standing up from a chair using your arms (e.g., wheelchair or bedside chair)?: A Lot Help needed to walk in hospital room?: Total Help needed climbing 3-5 steps with a railing? : Total 6 Click Score: 10    End of Session Equipment Utilized During Treatment: Gait belt Activity Tolerance: Patient tolerated treatment well Patient left: in chair;with call bell/phone within reach;with chair alarm set Nurse Communication: Mobility status Pain - Right/Left: Left Pain - part of body: Hip;Knee     Time: 7341-9379 PT Time Calculation (min) (ACUTE ONLY): 28 min  Charges:  $Therapeutic Exercise: 8-22 mins $Therapeutic Activity: 8-22 mins                    Chesley Noon, PTA 09/09/19, 10:08 AM

## 2019-09-10 LAB — GLUCOSE, CAPILLARY
Glucose-Capillary: 127 mg/dL — ABNORMAL HIGH (ref 70–99)
Glucose-Capillary: 127 mg/dL — ABNORMAL HIGH (ref 70–99)

## 2019-09-10 NOTE — TOC Progression Note (Signed)
Transition of Care [facility]) - Progression Note    Patient Details  Name: Krystal Goodwin MRN: [id number] Date of Birth: Dec 23, 1933  Transition [facility]) CM/SW Contact  Su Hilt, RN Phone Number: 09/10/2019, 12:10 PM  Clinical Narrative:    Allyson the patient's daughter will bring Cpap to [facility] from home, I notified Gerald Stabs at [facility]        Expected Discharge Plan and Services           Expected Discharge Date: 09/10/19                                     Social Determinants of Health (SDOH) Interventions    Readmission Risk Interventions No flowsheet data found.

## 2019-09-10 NOTE — Progress Notes (Signed)
Report called to [facility]. SW called EMS for transport

## 2019-09-10 NOTE — TOC Progression Note (Signed)
Transition of Care [facility]) - Progression Note    Patient Details  Name: DAISEE CENTNER MRN: [id number] Date of Birth: 01/02/1934  Transition of Care [facility]) CM/SW [place], RN Phone Number: 09/10/2019, 10:46 AM  Clinical Narrative:   Graylon Gunning the patient;'s daughter and let her know that the Patient wll DC to [facility] today and that we have ins approval, She is on her way into the hospital to see the patient, I explained the Bedside nurse has to call report then we have to call EMS for transport, she stated understanding         Expected Discharge Plan and Services           Expected Discharge Date: 09/10/19                                     Social Determinants of Health (SDOH) Interventions    Readmission Risk Interventions No flowsheet data found.

## 2019-09-10 NOTE — Plan of Care (Signed)

## 2019-09-10 NOTE — Progress Notes (Signed)
Physical Therapy Treatment Patient Details Name: Krystal Goodwin MRN: [id number] DOB: Apr 29, 1933 Today's Date: 09/10/2019    History of Present Illness Pt is 86 y.o. female s/p L hip hemiarthroplasty on 09/05/19.  PMH includes: HTN, former smoker, sleep apnea, asthma, SOB, renal disease, diabetes, hypothyroidism, anemia, and arthritis.    PT Comments    Pt was supine in bed with HOB elevated ~ 20 degrees upon arriving. She agrees to PT session and is cooperative. She was alert throughout but is slightly confused. Able to follow commands well but requires increased time and redirecting at times to focus on desired task. Unable to recall hip precautions. Pt requires max assist for bed mobility, transfers and to take 3 shuffling steps to recliner from EOB. Pt does endorse increased pain in LLE with wt bearing activity. Will require continued skilled PT at DC to address deficits with strength, balance, and safe functional mobility. Pt was in recliner at conclusion of session with BLEs elevated, call bell in reach and chair alarm in place.     Follow Up Recommendations  SNF;Supervision/Assistance - 24 hour     Equipment Recommendations  Rolling walker with 5" wheels    Recommendations for Other Services       Precautions / Restrictions Precautions Precautions: Posterior Hip Precaution Booklet Issued: Yes (comment) Precaution Comments: Wedge pillow not present in the room, so 2 pillows placed on the inside to prevent IR and adduction of the L hip. Restrictions Weight Bearing Restrictions: Yes LLE Weight Bearing: Weight bearing as tolerated    Mobility  Bed Mobility Overal bed mobility: Needs Assistance Bed Mobility: Supine to Sit Rolling: Mod assist;Max assist   Supine to sit: Max assist     General bed mobility comments: pt required max assist to exit R side of bed with increased time required and max vcs for technique and sequencing. pt unable to recall hip precautions and needs  constant reminders throughout  Transfers Overall transfer level: Needs assistance Equipment used: Rolling walker (2 wheeled);2 person hand held assist (pt performed stand pivot/step without AD but with BUE suppor) Transfers: Sit to/from [facility] Sit to Stand: Max assist;+2 safety/equipment;From elevated surface Stand pivot transfers: Max assist;+2 safety/equipment;From elevated surface       General transfer comment: Pt was able to perform STS 3 x prior to stand pivot/shuffling steps to recliner. pt reports pain in L knee and L hip throughout wt bearing  Ambulation/Gait Ambulation/Gait assistance: Max assist Gait Distance (Feet): 3 Feet Assistive device: None (BUE support on therapist with heavy use of gait belt) Gait Pattern/deviations: Step-to pattern;Antalgic;Decreased step length - right;Decreased stance time - left;Decreased stride length Gait velocity: decreased.   General Gait Details: Pt was able to slide R/LLE to advance towards recliner with therapist assiting with lateral wt shift. BUE support on therapist with therapist using gait belt for safety. ~ 2 minutes in standing to get to recliner from EOB. notified RN therapist will return to assist pt back to bed once pt is ready.   Stairs             Wheelchair Mobility    Modified Rankin (Stroke Patients Only)       Balance Overall balance assessment: Needs assistance Sitting-balance support: Feet supported Sitting balance-Leahy Scale: Good Sitting balance - Comments: able to maintain sitting without assistance Postural control: Posterior lean Standing balance support: Bilateral upper extremity supported;During functional activity Standing balance-Leahy Scale: Poor Standing balance comment: pt is unsteady on feet with BUE support  throughout and vcs for fwd wt shift in standing to prevent posterior LOB                            Cognition Arousal/Alertness: Awake/alert Behavior  During Therapy: WFL for tasks assessed/performed Overall Cognitive Status: History of cognitive impairments - at baseline                                 General Comments: Pt is A and talkative however slightly confused. Is able to follow commands but needs redirecting to stay focused on task.      Exercises      General Comments        Pertinent Vitals/Pain Pain Assessment: 0-10 Pain Score: 4  Faces Pain Scale: Hurts little more Pain Location: L Hip Pain Descriptors / Indicators: Aching;Grimacing;Sharp;Guarding Pain Intervention(s): Limited activity within patient's tolerance;Monitored during session;Premedicated before session;Repositioned    Home Living                      Prior Function            PT Goals (current goals can now be found in the care plan section) Acute Rehab PT Goals Patient Stated Goal: for it not to hurt Progress towards PT goals: Progressing toward goals    Frequency    BID      PT Plan Current plan remains appropriate    Co-evaluation              AM-PAC PT "6 Clicks" Mobility   Outcome Measure  Help needed turning from your back to your side while in a flat bed without using bedrails?: A Lot Help needed moving from lying on your back to sitting on the side of a flat bed without using bedrails?: A Lot Help needed moving to and from a bed to a chair (including a wheelchair)?: A Lot Help needed standing up from a chair using your arms (e.g., wheelchair or bedside chair)?: A Lot Help needed to walk in hospital room?: Total Help needed climbing 3-5 steps with a railing? : Total 6 Click Score: 10    End of Session Equipment Utilized During Treatment: Gait belt Activity Tolerance: Patient tolerated treatment well Patient left: in chair;with call bell/phone within reach;with chair alarm set Nurse Communication: Mobility status PT Visit Diagnosis: Unsteadiness on feet (R26.81);Other abnormalities of gait and  mobility (R26.89);Repeated falls (R29.6);Muscle weakness (generalized) (M62.81);History of falling (Z91.81);Difficulty in walking, not elsewhere classified (R26.2);Pain Pain - Right/Left: Left Pain - part of body: Hip;Knee     Time: 6226-3335 PT Time Calculation (min) (ACUTE ONLY): 25 min  Charges:  $Therapeutic Activity: 23-37 mins                     Julaine Fusi PTA 09/10/19, 9:21 AM

## 2019-09-10 NOTE — TOC Progression Note (Signed)
Transition of Care [facility]) - Progression Note    Patient Details  Name: Krystal Goodwin MRN: [id number] Date of Birth: 08-10-1933  Transition of Care [facility] CM/SW Contact  Su Hilt, RN Phone Number: 09/10/2019, 11:04 AM  Clinical Narrative: bedside nurse called report to [facility], TOC CM called EMS to arrange   Transportation, there are none ahead of her           Expected Discharge Plan and Services           Expected Discharge Date: 09/10/19                                     Social Determinants of Health (SDOH) Interventions    Readmission Risk Interventions No flowsheet data found.

## 2019-09-10 NOTE — Plan of Care (Signed)
  Problem: Pain Managment: Goal: General experience of comfort will improve Outcome: Progressing   Problem: Safety: Goal: Ability to remain free from injury will improve Outcome: Progressing   Problem: Skin Integrity: Goal: Risk for impaired skin integrity will decrease Outcome: Progressing   

## 2019-09-10 NOTE — TOC Progression Note (Signed)
Transition of Care [facility]) - Progression Note    Patient Details  Name: Krystal Goodwin MRN: [id number] Date of Birth: 10/07/1933  Transition of Care [facility]) CM/SW [place], RN Phone Number: 09/10/2019, 10:18 AM  Clinical Narrative:    Received a call from [facility] with auth approval [id number] [phone] ref number Josem Kaufmann ID [id number] next review 8/4, has had vaccines        Expected Discharge Plan and Services           Expected Discharge Date: 09/09/19                                     Social Determinants of Health (SDOH) Interventions    Readmission Risk Interventions No flowsheet data found.

## 2019-09-10 NOTE — Discharge Summary (Signed)
[name] NAME: Krystal Goodwin    MR#:  [id number]  DATE OF BIRTH:  1933-09-13  DATE OF ADMISSION:  09/04/2019 ADMITTING PHYSICIAN: Vianne Bulls, MD  DATE OF DISCHARGE: 09/10/2019  PRIMARY CARE PHYSICIAN: Derinda Late, MD    ADMISSION DIAGNOSIS:  Fall [W19.XXXA] Closed fracture of left hip, initial encounter ([place]) [S72.002A] Closed left hip fracture, initial encounter ([place]) [S72.002A]  DISCHARGE DIAGNOSIS:  Closed left hip fracture status post left hip hemiarthroplasty  SECONDARY DIAGNOSIS:   Past Medical History:  Diagnosis Date  . Anemia    iron def.anemia  . Aortic stenosis, severe   . Asthma   . Chest pain   . Colon cancer ([place])   . Complication of anesthesia    nausea  . Diabetes mellitus without complication ([facility] [place])   . Dyslipidemia   . Dyspnea   . Gout, joint   . H/O postmenopausal osteoporosis   . Hematuria   . History of palpitations   . Hx of colonic polyps   . Hypertension   . Hypothyroidism   . IBS (irritable bowel syndrome)   . Schatzki's ring   . Sleep apnea     HOSPITAL COURSE:  AMANDALEE LACAP a 86 y.o.femalewith medical history significant fortype 2 diabetes mellitus, hypertension, hypothyroidism, chronic kidney disease stage III, heart block with pacer, and paroxysmal atrial fibrillation on Eliquis, now presenting to the emergency department with severe left hip and knee pain after fall at home. Patient reports that she had been in her usual state of health but fell twice recently, is unsure of exactly how this happened, but denies any loss of consciousness or head injury. She fell backwards last night and began to experience severe left hip pain  1.Left hip fracture status post mechanical fall -Presents with left hip pain after a fall and is found to have femoral neck fracture -Orthopedic surgery consult with Dr Roland Rack appreciated  - POD# 5 s/p Lefthip unipolar hemiarthroplasty,  tolerating  PT. Now resume eliquis - Pre-op cardiology consultation with Dr. Nehemiah Massed appreciated - resume ARB at 25 mg due to soft bp (home dose was 100 mg qd)  -continue pain-control and supportive care  2.Paroxysmal atrial fibrillation -Ok to resume eliquis per Ortho and cardiology -continue beta-blockers  3.Type II DM-well controlled w/o complication -W5I was normal recently and she was going to be given a trial off of glipizide -cont low-intensity SSI with Novolog if needed -sugars stable--d/ced glipizide  4.CKD IIIb -SCr is 1.21 on admission, similar to priors -Renally-dose medications, monitor  5.OSA -Continue CPAP qHS  6.Hypertension -BP at goal -cont BB and low dose losartan   DVT prophylaxis:Eliquis  Code Status:Full Family Communication:Discussed with patient and daughter in the room Disposition Plan: Patient is from:Home Anticipated d/c is OE:VOJJ Anticipated d/c date is:09/10/19 Patient currently:POD# 5 awaiting insurance authorization Consults called:Orthopedic surgery consulted by ED physician Admission status:Inpatient  CONSULTS OBTAINED:  Treatment Team:  Poggi, Marshall Cork, MD Corey Skains, MD  DRUG ALLERGIES:   Allergies  Allergen Reactions  . Amoxicillin Itching    Breaks out in a rash  . Erythromycin Nausea And Vomiting  . Vicodin [Hydrocodone-Acetaminophen] Nausea And Vomiting    DISCHARGE MEDICATIONS:   Allergies as of 09/10/2019      Reactions   Amoxicillin Itching   Breaks out in a rash   Erythromycin Nausea And Vomiting   Vicodin [hydrocodone-acetaminophen] Nausea And Vomiting      Medication List  STOP taking these medications   glipiZIDE 5 MG 24 hr tablet Commonly known as: GLUCOTROL XL     TAKE these medications   ALPRAZolam 0.25 MG tablet Commonly known as: XANAX Take 1-2 tablets (0.25-0.5 mg total) by mouth every 4 (four) hours as needed for anxiety or sleep.    atorvastatin 10 MG tablet Commonly known as: LIPITOR Take 10 mg by mouth daily.   citalopram 20 MG tablet Commonly known as: CELEXA Take 20 mg by mouth daily.   Eliquis 5 MG Tabs tablet Generic drug: apixaban Take 5 mg by mouth 2 (two) times daily.   ferrous sulfate 325 (65 FE) MG tablet Take 325 mg by mouth daily with breakfast.   furosemide 40 MG tablet Commonly known as: LASIX Take 40 mg by mouth.   HYDROcodone-acetaminophen 5-325 MG tablet Commonly known as: NORCO/VICODIN Take 1 tablet by mouth every 4 (four) hours as needed for moderate pain.   levothyroxine 100 MCG tablet Commonly known as: SYNTHROID Take 100 mcg by mouth daily before breakfast.   losartan 25 MG tablet Commonly known as: COZAAR Take 1 tablet (25 mg total) by mouth daily. What changed:   medication strength  how much to take   melatonin-pyridoxine 1-10 MG Tabs Take by mouth at bedtime as needed.   metoprolol succinate 25 MG 24 hr tablet Commonly known as: TOPROL-XL Take 25 mg by mouth daily.   multivitamin tablet Take 1 tablet by mouth daily.   pantoprazole 40 MG tablet Commonly known as: PROTONIX Take 40 mg by mouth daily.   traMADol 50 MG tablet Commonly known as: ULTRAM Take 1 tablet (50 mg total) by mouth every 6 (six) hours as needed for moderate pain.            Discharge Care Instructions  (From admission, onward)         Start     Ordered   09/09/19 0000  Discharge wound care:       Comments: Per Orthopedic instrcution   09/09/19 0827          If you experience worsening of your admission symptoms, develop shortness of breath, life threatening emergency, suicidal or homicidal thoughts you must seek medical attention immediately by calling 911 or calling your MD immediately  if symptoms less severe.  You Must read complete instructions/literature along with all the possible adverse reactions/side effects for all the Medicines you take and that have been  prescribed to you. Take any new Medicines after you have completely understood and accept all the possible adverse reactions/side effects.   Please note  You were cared for by a hospitalist during your hospital stay. If you have any questions about your discharge medications or the care you received while you were in the hospital after you are discharged, you can call the unit and asked to speak with the hospitalist on call if the hospitalist that took care of you is not available. Once you are discharged, your primary care physician will handle any further medical issues. Please note that NO REFILLS for any discharge medications will be authorized once you are discharged, as it is imperative that you return to your primary care physician (or establish a relationship with a primary care physician if you do not have one) for your aftercare needs so that they can reassess your need for medications and monitor your lab values. Today   SUBJECTIVE   No new complaints I hope I can go today  VITAL SIGNS:  Blood pressure Marland Kitchen)  142/54, pulse 83, temperature 98.4 F (36.9 C), temperature source Oral, resp. rate 16, height 5\' 5"  (1.651 m), weight 90.7 kg, SpO2 96 %.  I/O:    Intake/Output Summary (Last 24 hours) at 09/10/2019 0818 Last data filed at 09/10/2019 0233 Gross per 24 hour  Intake --  Output 600 ml  Net -600 ml    PHYSICAL EXAMINATION:  GENERAL:  86 y.o.-year-old patient lying in the bed with no acute distress. obese EYES: Pupils equal, round, reactive to light and accommodation. No scleral icterus.  HEENT: Head atraumatic, normocephalic. Oropharynx and nasopharynx clear.  NECK:  Supple, no jugular venous distention. No thyroid enlargement, no tenderness.  LUNGS: Normal breath sounds bilaterally, no wheezing, rales,rhonchi or crepitation. No use of accessory muscles of respiration.  CARDIOVASCULAR: S1, S2 normal. No murmurs, rubs, or gallops.  ABDOMEN: Soft, non-tender, non-distended.  Bowel sounds present. No organomegaly or mass.  EXTREMITIES: No pedal edema, cyanosis, or clubbing.  NEUROLOGIC: Cranial nerves II through XII are intact. Muscle strength 5/5 in all extremities. Sensation intact. Gait not checked.  PSYCHIATRIC: The patient is alert and oriented x 3.  SKIN: No obvious rash, lesion, or ulcer.   DATA REVIEW:   CBC  Recent Labs  Lab 09/07/19 0453  WBC 11.9*  HGB 8.6*  HCT 26.0*  PLT 153    Chemistries  Recent Labs  Lab 09/07/19 0453  NA 134*  K 3.9  CL 101  CO2 19*  GLUCOSE 135*  BUN 30*  CREATININE 1.48*  CALCIUM 8.0*    Microbiology Results   Recent Results (from the past 240 hour(s))  SARS Coronavirus 2 by RT PCR (hospital order, performed in [facility] hospital lab) Nasopharyngeal Nasopharyngeal Swab     Status: None   Collection Time: 09/04/19 11:20 PM   Specimen: Nasopharyngeal Swab  Result Value Ref Range Status   SARS Coronavirus 2 NEGATIVE NEGATIVE Final    Comment: (NOTE) SARS-CoV-2 target nucleic acids are NOT DETECTED.  The SARS-CoV-2 RNA is generally detectable in upper and lower respiratory specimens during the acute phase of infection. The lowest concentration of SARS-CoV-2 viral copies this assay can detect is 250 copies / mL. A negative result does not preclude SARS-CoV-2 infection and should not be used as the sole basis for treatment or other patient management decisions.  A negative result may occur with improper specimen collection / handling, submission of specimen other than nasopharyngeal swab, presence of viral mutation(s) within the areas targeted by this assay, and inadequate number of viral copies (<250 copies / mL). A negative result must be combined with clinical observations, patient history, and epidemiological information.  Fact Sheet for Patients:   StrictlyIdeas.no  Fact Sheet for Healthcare Providers: BankingDealers.co.za  This test is not yet  approved or  cleared by the [place] FDA and has been authorized for detection and/or diagnosis of SARS-CoV-2 by FDA under an Emergency Use Authorization (EUA).  This EUA will remain in effect (meaning this test can be used) for the duration of the COVID-19 declaration under Section 564(b)(1) of the Act, 21 U.S.C. section 360bbb-3(b)(1), unless the authorization is terminated or revoked sooner.  Performed at [facility], [address] [place], [place] [address]   SARS Coronavirus 2 by RT PCR (hospital order, performed in [facility] hospital lab) Nasopharyngeal Nasopharyngeal Swab     Status: None   Collection Time: 09/09/19  9:24 AM   Specimen: Nasopharyngeal Swab  Result Value Ref Range Status   SARS Coronavirus 2 NEGATIVE  NEGATIVE Final    Comment: (NOTE) SARS-CoV-2 target nucleic acids are NOT DETECTED.  The SARS-CoV-2 RNA is generally detectable in upper and lower respiratory specimens during the acute phase of infection. The lowest concentration of SARS-CoV-2 viral copies this assay can detect is 250 copies / mL. A negative result does not preclude SARS-CoV-2 infection and should not be used as the sole basis for treatment or other patient management decisions.  A negative result may occur with improper specimen collection / handling, submission of specimen other than nasopharyngeal swab, presence of viral mutation(s) within the areas targeted by this assay, and inadequate number of viral copies (<250 copies / mL). A negative result must be combined with clinical observations, patient history, and epidemiological information.  Fact Sheet for Patients:   StrictlyIdeas.no  Fact Sheet for Healthcare Providers: BankingDealers.co.za  This test is not yet approved or  cleared by the [place] FDA and has been authorized for detection and/or diagnosis of SARS-CoV-2 by FDA under an Emergency Use Authorization  (EUA).  This EUA will remain in effect (meaning this test can be used) for the duration of the COVID-19 declaration under Section 564(b)(1) of the Act, 21 U.S.C. section 360bbb-3(b)(1), unless the authorization is terminated or revoked sooner.  Performed at [facility], [address], [place], [place] [address]     RADIOLOGY:  No results found.   CODE STATUS:     Code Status Orders  (From admission, onward)         Start     Ordered   09/05/19 0049  Full code  Continuous        09/05/19 0049        Code Status History    This patient has a current code status but no historical code status.   Advance Care Planning Activity       TOTAL TIME TAKING CARE OF THIS PATIENT: 35 minutes.    Fritzi Mandes M.D  [facility]    CC: Primary care physician; Derinda Late, MD

## 2019-09-27 ENCOUNTER — Encounter: Payer: Self-pay | Admitting: Surgery

## 2019-12-30 ENCOUNTER — Emergency Department
Admission: EM | Admit: 2019-12-30 | Discharge: 2019-12-31 | Disposition: A | Discharge status: Home / Self Care | Payer: Medicare PPO | Attending: Emergency Medicine | Admitting: Emergency Medicine

## 2019-12-30 ENCOUNTER — Encounter: Payer: Self-pay | Admitting: Radiology

## 2019-12-30 ENCOUNTER — Emergency Department: Payer: Medicare PPO

## 2019-12-30 DIAGNOSIS — Z96642 Presence of left artificial hip joint: Secondary | ICD-10-CM | POA: Insufficient documentation

## 2019-12-30 DIAGNOSIS — N183 Chronic kidney disease, stage 3 unspecified: Secondary | ICD-10-CM | POA: Insufficient documentation

## 2019-12-30 DIAGNOSIS — M25552 Pain in left hip: Secondary | ICD-10-CM | POA: Insufficient documentation

## 2019-12-30 DIAGNOSIS — S8002XA Contusion of left knee, initial encounter: Secondary | ICD-10-CM | POA: Insufficient documentation

## 2019-12-30 DIAGNOSIS — W1839XA Other fall on same level, initial encounter: Secondary | ICD-10-CM | POA: Insufficient documentation

## 2019-12-30 DIAGNOSIS — Z87891 Personal history of nicotine dependence: Secondary | ICD-10-CM | POA: Diagnosis not present

## 2019-12-30 DIAGNOSIS — J45909 Unspecified asthma, uncomplicated: Secondary | ICD-10-CM | POA: Insufficient documentation

## 2019-12-30 DIAGNOSIS — E039 Hypothyroidism, unspecified: Secondary | ICD-10-CM | POA: Insufficient documentation

## 2019-12-30 DIAGNOSIS — I129 Hypertensive chronic kidney disease with stage 1 through stage 4 chronic kidney disease, or unspecified chronic kidney disease: Secondary | ICD-10-CM | POA: Diagnosis not present

## 2019-12-30 DIAGNOSIS — I6782 Cerebral ischemia: Secondary | ICD-10-CM | POA: Insufficient documentation

## 2019-12-30 DIAGNOSIS — Y92129 Unspecified place in nursing home as the place of occurrence of the external cause: Secondary | ICD-10-CM | POA: Diagnosis not present

## 2019-12-30 DIAGNOSIS — S0990XA Unspecified injury of head, initial encounter: Secondary | ICD-10-CM | POA: Diagnosis not present

## 2019-12-30 DIAGNOSIS — Z79899 Other long term (current) drug therapy: Secondary | ICD-10-CM | POA: Diagnosis not present

## 2019-12-30 DIAGNOSIS — Z85038 Personal history of other malignant neoplasm of large intestine: Secondary | ICD-10-CM | POA: Insufficient documentation

## 2019-12-30 DIAGNOSIS — G311 Senile degeneration of brain, not elsewhere classified: Secondary | ICD-10-CM | POA: Insufficient documentation

## 2019-12-30 DIAGNOSIS — S8992XA Unspecified injury of left lower leg, initial encounter: Secondary | ICD-10-CM | POA: Diagnosis present

## 2019-12-30 DIAGNOSIS — E1122 Type 2 diabetes mellitus with diabetic chronic kidney disease: Secondary | ICD-10-CM | POA: Insufficient documentation

## 2019-12-30 DIAGNOSIS — Z950 Presence of cardiac pacemaker: Secondary | ICD-10-CM | POA: Diagnosis not present

## 2019-12-30 LAB — CBC WITH DIFFERENTIAL/PLATELET
Abs Immature Granulocytes: 0.06 10*3/uL (ref 0.00–0.07)
Basophils Absolute: 0.1 10*3/uL (ref 0.0–0.1)
Basophils Relative: 1 %
Eosinophils Absolute: 0.4 10*3/uL (ref 0.0–0.5)
Eosinophils Relative: 5 %
HCT: 36.7 % (ref 36.0–46.0)
Hemoglobin: 11.2 g/dL — ABNORMAL LOW (ref 12.0–15.0)
Immature Granulocytes: 1 %
Lymphocytes Relative: 25 %
Lymphs Abs: 2 10*3/uL (ref 0.7–4.0)
MCH: 26.4 pg (ref 26.0–34.0)
MCHC: 30.5 g/dL (ref 30.0–36.0)
MCV: 86.6 fL (ref 80.0–100.0)
Monocytes Absolute: 0.8 10*3/uL (ref 0.1–1.0)
Monocytes Relative: 10 %
Neutro Abs: 4.6 10*3/uL (ref 1.7–7.7)
Neutrophils Relative %: 58 %
Platelets: 214 10*3/uL (ref 150–400)
RBC: 4.24 MIL/uL (ref 3.87–5.11)
RDW: 16.3 % — ABNORMAL HIGH (ref 11.5–15.5)
WBC: 7.9 10*3/uL (ref 4.0–10.5)
nRBC: 0 % (ref 0.0–0.2)

## 2019-12-30 LAB — COMPREHENSIVE METABOLIC PANEL
ALT: 13 U/L (ref 0–44)
AST: 22 U/L (ref 15–41)
Albumin: 3.5 g/dL (ref 3.5–5.0)
Alkaline Phosphatase: 80 U/L (ref 38–126)
Anion gap: 9 (ref 5–15)
BUN: 22 mg/dL (ref 8–23)
CO2: 25 mmol/L (ref 22–32)
Calcium: 9.6 mg/dL (ref 8.9–10.3)
Chloride: 106 mmol/L (ref 98–111)
Creatinine, Ser: 1.38 mg/dL — ABNORMAL HIGH (ref 0.44–1.00)
GFR, Estimated: 38 mL/min — ABNORMAL LOW (ref >60)
Glucose, Bld: 109 mg/dL — ABNORMAL HIGH (ref 70–99)
Potassium: 3.7 mmol/L (ref 3.5–5.1)
Sodium: 140 mmol/L (ref 135–145)
Total Bilirubin: 0.7 mg/dL (ref 0.3–1.2)
Total Protein: 7.1 g/dL (ref 6.5–8.1)

## 2019-12-30 LAB — PROTIME-INR
INR: 1.5 — ABNORMAL HIGH (ref 0.8–1.2)
Prothrombin Time: 17.5 seconds — ABNORMAL HIGH (ref 11.4–15.2)

## 2019-12-30 MED ORDER — FENTANYL CITRATE (PF) 100 MCG/2ML IJ SOLN
50.0000 ug | Freq: Once | INTRAMUSCULAR | Status: AC
  Administered 2019-12-30: 50 ug via INTRAVENOUS
  Filled 2019-12-30: qty 2

## 2019-12-30 NOTE — ED Triage Notes (Addendum)
Pt to ED for c/o fall from [facility], injured Lt knee. Has pain and skin tear to knee. Also with c/o Lt hip pain CBG 126. Is on Eliquis for AFib

## 2019-12-30 NOTE — ED Notes (Signed)
Pain meds given  siderails up x 2

## 2019-12-30 NOTE — ED Provider Notes (Signed)
[facility] Emergency Department Provider Note  ____________________________________________   None    (approximate)  I have reviewed the triage vital signs and the nursing notes.   HISTORY  Chief Complaint Knee Pain (Lt/ unwitnessed fall)   HPI Krystal Goodwin is a 86 y.o. female who has medical history of asthma, DM, HTN, hypothyroidism, OSA, severe aortic stenosis status post aortic valve replacement currently anticoagulated on Eliquis and colon cancer not currently undergoing treatment who presents EMS from nursing facility after she fell while transferring from a chair to her wheelchair onto her left knee.  Patient denies striking her head or any LOC.  She states recalls everything that happened.  She states she did not experience any other pain other than her left knee a little bit her left hip.  She specifically denies any pain in her chest, abdomen, back, upper extremities or right lower extremity.  She otherwise has been in usual state of health without any recent fevers, chills, cough, chest pain, vomiting, diarrhea, dysuria or other recent falls or injuries.  She does note she has chronic pain throughout her back and chronic pain in all her joints in her extremities.         Past Medical History:  Diagnosis Date   Anemia    iron def.anemia   Aortic stenosis, severe    Asthma    Chest pain    Colon cancer (HCC)    Complication of anesthesia    nausea   Diabetes mellitus without complication (HCC)    Dyslipidemia    Dyspnea    Gout, joint    H/O postmenopausal osteoporosis    Hematuria    History of palpitations    Hx of colonic polyps    Hypertension    Hypothyroidism    IBS (irritable bowel syndrome)    Schatzki's ring    Sleep apnea     Patient Active Problem List   Diagnosis Date Noted   Closed fracture of left hip ([place]) 09/05/2019   Diabetes mellitus type II, non insulin dependent ([place]) 09/05/2019    Hypothyroidism    CKD (chronic kidney disease), stage III (HCC)    Hypertension    Sleep apnea    PAF (paroxysmal atrial fibrillation) ([place])    Fall     Past Surgical History:  Procedure Laterality Date   AORTIC VALVE REPLACEMENT     CARDIAC PACEMAKER PLACEMENT     CHOLECYSTECTOMY     COLON SURGERY  1997   RIGHT HEMICOLECTOMY   COLONOSCOPY     COLONOSCOPY WITH PROPOFOL N/A 09/07/2016   Procedure: COLONOSCOPY WITH PROPOFOL;  Surgeon: Manya Silvas, MD;  Location: [facility] ENDOSCOPY;  Service: Endoscopy;  Laterality: N/A;   ESOPHAGOGASTRODUODENOSCOPY     HIP ARTHROPLASTY Left 09/05/2019   Procedure: ARTHROPLASTY BIPOLAR HIP (HEMIARTHROPLASTY);  Surgeon: Corky Mull, MD;  Location: [facility] ORS;  Service: Orthopedics;  Laterality: Left;   TEE WITHOUT CARDIOVERSION  03/08/2013    Prior to Admission medications   Medication Sig Start Date End Date Taking? Authorizing Provider  ALPRAZolam (XANAX) 0.25 MG tablet Take 1-2 tablets (0.25-0.5 mg total) by mouth every 4 (four) hours as needed for anxiety or sleep. 09/09/19   Fritzi Mandes, MD  atorvastatin (LIPITOR) 10 MG tablet Take 10 mg by mouth daily. 06/20/19   [provider]  citalopram (CELEXA) 20 MG tablet Take 20 mg by mouth daily. 08/21/19   [provider]  ELIQUIS 5 MG TABS tablet Take 5 mg  by mouth 2 (two) times daily. 08/21/19   [provider]  ferrous sulfate 325 (65 FE) MG tablet Take 325 mg by mouth daily with breakfast.    [provider]  furosemide (LASIX) 40 MG tablet Take 40 mg by mouth.    [provider]  HYDROcodone-acetaminophen (NORCO/VICODIN) 5-325 MG tablet Take 1 tablet by mouth every 4 (four) hours as needed for moderate pain. 09/06/19   Lattie Corns, PA-C  levothyroxine (SYNTHROID) 100 MCG tablet Take 100 mcg by mouth daily before breakfast.     [provider]  losartan (COZAAR) 25 MG tablet Take 1 tablet (25 mg total) by mouth daily. 09/09/19    Fritzi Mandes, MD  Melatonin-Pyridoxine 1-10 MG TABS Take by mouth at bedtime as needed.    [provider]  metoprolol succinate (TOPROL-XL) 25 MG 24 hr tablet Take 25 mg by mouth daily. 06/20/19   [provider]  Multiple Vitamin (MULTIVITAMIN) tablet Take 1 tablet by mouth daily.    [provider]  pantoprazole (PROTONIX) 40 MG tablet Take 40 mg by mouth daily.    [provider]  traMADol (ULTRAM) 50 MG tablet Take 1 tablet (50 mg total) by mouth every 6 (six) hours as needed for moderate pain. 09/06/19   Lattie Corns, PA-C    Allergies Amoxicillin, Erythromycin, and Vicodin [hydrocodone-acetaminophen]  Family History  Problem Relation Age of Onset   Breast cancer Neg Hx     Social History Social History   Tobacco Use   Smoking status: Former Smoker    Packs/day: 1.00    Years: 20.00    Pack years: 20.00    Types: Cigarettes    Quit date: 02/21/1989    Years since quitting: 30.8   Smokeless tobacco: Never Used  Vaping Use   Vaping Use: Never used  Substance Use Topics   Alcohol use: No   Drug use: No    Review of Systems  Review of Systems  Constitutional: Negative for chills and fever.  HENT: Negative for sore throat.   Eyes: Negative for pain.  Respiratory: Negative for cough and stridor.   Cardiovascular: Negative for chest pain.  Gastrointestinal: Negative for vomiting.  Genitourinary: Negative for dysuria.  Musculoskeletal: Positive for joint pain ( L knee, L hip). Negative for myalgias.  Skin: Negative for rash.  Neurological: Negative for seizures, loss of consciousness and headaches.  Psychiatric/Behavioral: Negative for suicidal ideas.  All other systems reviewed and are negative.     ____________________________________________   PHYSICAL EXAM:  VITAL SIGNS: ED Triage Vitals  Enc Vitals Group     BP 12/30/19 2247 (!) 110/40     Pulse Rate 12/30/19 2247 70     Resp 12/30/19 2247 16     Temp  12/30/19 2247 98.4 F (36.9 C)     Temp Source 12/30/19 2246 Oral     SpO2 12/30/19 2247 100 %     Weight 12/30/19 2249 199 lb 15.3 oz (90.7 kg)     Height 12/30/19 2249 5\' 5"  (1.651 m)     Head Circumference --      Peak Flow --      Pain Score --      Pain Loc --      Pain Edu? --      Excl. in [place]? --    Vitals:   12/31/19 0100 12/31/19 0115  BP:  (!) 128/117  Pulse: 73 70  Resp: 17 (!) 21  Temp:  SpO2: 99% 98%   Physical Exam Vitals and nursing note reviewed.  Constitutional:      General: She is not in acute distress.    Appearance: She is well-developed.  HENT:     Head: Normocephalic and atraumatic.     Right Ear: External ear normal.     Left Ear: External ear normal.     Nose: Nose normal.  Eyes:     Conjunctiva/sclera: Conjunctivae normal.  Cardiovascular:     Rate and Rhythm: Normal rate and regular rhythm.     Heart sounds: No murmur heard.   Pulmonary:     Effort: Pulmonary effort is normal. No respiratory distress.     Breath sounds: Normal breath sounds.  Abdominal:     Palpations: Abdomen is soft.     Tenderness: There is no abdominal tenderness.  Musculoskeletal:     Cervical back: Neck supple.  Skin:    General: Skin is warm and dry.     Capillary Refill: Capillary refill takes less than 2 seconds.  Neurological:     Mental Status: She is alert.  Psychiatric:        Mood and Affect: Mood normal.     Cranial 2-12 grossly intact.  Patient is full symmetric strength on her bilateral lower extremities and there are no tenderness step-offs or deformities over the C/T/L-spine.  Patient is able to lift her right leg off the bed on command and plantar and dorsiflex her toes but is able to lift her left leg off the bed.  However she is able to plantar and dorsiflex her left foot on command.  2+ bilateral radial and DP pulses.  Patient has some pain range of motion of the left knee and significant weakness of the left knee.  Is also a small effusion.  No  other significant overlying trauma or injuries visualized. ____________________________________________   LABS (all labs ordered are listed, but only abnormal results are displayed)  Labs Reviewed  CBC WITH DIFFERENTIAL/PLATELET - Abnormal; Notable for the following components:      Result Value   Hemoglobin 11.2 (*)    RDW 16.3 (*)    All other components within normal limits  COMPREHENSIVE METABOLIC PANEL - Abnormal; Notable for the following components:   Glucose, Bld 109 (*)    Creatinine, Ser 1.38 (*)    GFR, Estimated 38 (*)    All other components within normal limits  PROTIME-INR - Abnormal; Notable for the following components:   Prothrombin Time 17.5 (*)    INR 1.5 (*)    All other components within normal limits   ____________________________________________  EKG  Sinus rhythm with a ventricular rate of 72, incomplete left bundle branch block and evidence of Q waves in the inferior leads which were present on prior EKGs.  No other clear evidence of acute ischemia. ____________________________________________  RADIOLOGY  ED MD interpretation: No evidence of intracranial hemorrhage or fracture dislocation of the left hip or knee.  Official radiology report(s): DG Knee 2 Views Left  Result Date: 12/30/2019 CLINICAL DATA:  Pain status post fall EXAM: LEFT KNEE - 1-2 VIEW COMPARISON:  None. FINDINGS: There is soft tissue swelling about the knee. There are end-stage degenerative changes primarily noted within the lateral compartment. There is a small suprapatellar joint effusion. There is no definite acute displaced fracture or dislocation. IMPRESSION: 1. No definite acute displaced fracture or dislocation. Soft tissue swelling about the knee with a small suprapatellar joint effusion. 2. End-stage degenerative changes of  the knee. Electronically Signed   By: Constance Holster M.D.   On: 12/30/2019 23:51   CT Head Wo Contrast  Result Date: 12/31/2019 CLINICAL DATA:   Head trauma from a fall. EXAM: CT HEAD WITHOUT CONTRAST TECHNIQUE: Contiguous axial images were obtained from the base of the skull through the vertex without intravenous contrast. COMPARISON:  09/04/2019 FINDINGS: Brain: Diffuse cerebral atrophy. Ventricular dilatation likely due to central atrophy. Patchy low-attenuation changes in the deep white matter consistent with small vessel ischemia. No mass-effect or midline shift. No abnormal extra-axial fluid collections. The gray-white matter junctions are distinct. Basal cisterns are not effaced. No acute intracranial hemorrhage. Vascular: Moderate intracranial arterial vascular calcifications. Skull: The calvarium appears intact. No acute depressed skull fractures. Sinuses/Orbits: Paranasal sinuses and mastoid air cells are clear. Other: None. IMPRESSION: 1. No acute intracranial abnormalities. 2. Chronic atrophy and small vessel ischemia. Electronically Signed   By: Lucienne Capers M.D.   On: 12/31/2019 00:53   CT Knee Left Wo Contrast  Result Date: 12/31/2019 CLINICAL DATA:  Pain EXAM: CT OF THE LEFT KNEE WITHOUT CONTRAST TECHNIQUE: Multidetector CT imaging of the LEFT knee was performed according to the standard protocol. Multiplanar CT image reconstructions were also generated. COMPARISON:  X-ray from same day FINDINGS: Bones/Joint/Cartilage There is no acute displaced fracture. No dislocation. Advanced tricompartmental degenerative changes are noted, greatest within the lateral compartment. There is heterogeneous osteopenia. Ligaments Suboptimally assessed by CT. Muscles and Tendons There is no acute intramuscular hematoma. Muscular atrophy is noted. Soft tissues There is soft tissue swelling about the knee. There are vascular calcifications. There is a moderate-sized joint effusion. IMPRESSION: 1. No acute displaced fracture or dislocation. 2. Advanced tricompartmental degenerative changes, greatest within the lateral compartment. 3. Moderate-sized joint  effusion. 4. Soft tissue swelling about the knee. Electronically Signed   By: Constance Holster M.D.   On: 12/31/2019 00:52   DG Hip Unilat W or Wo Pelvis 2-3 Views Left  Result Date: 12/30/2019 CLINICAL DATA:  Pain status post fall EXAM: DG HIP (WITH OR WITHOUT PELVIS) 2-3V LEFT COMPARISON:  September 05, 2019 FINDINGS: The patient is status post total hip arthroplasty on the left. The hardware appears grossly intact. There is no acute displaced fracture or dislocation. No evidence for hardware loosening. IMPRESSION: 1. No acute osseous abnormality. 2. Left total hip arthroplasty without evidence for hardware fracture or loosening. Electronically Signed   By: Constance Holster M.D.   On: 12/30/2019 23:50    ____________________________________________   PROCEDURES  Procedure(s) performed (including Critical Care):  .1-3 Lead EKG Interpretation Performed by: Lucrezia Starch, MD Authorized by: Lucrezia Starch, MD     Interpretation: normal     ECG rate assessment: normal     Rhythm: sinus rhythm     Ectopy: none     Conduction: normal       ____________________________________________   INITIAL IMPRESSION / ASSESSMENT AND PLAN / ED COURSE        Patient presents with [place] to history exam for assessment after mechanical fall that was unwitnessed but did occur to nursing home.  Patient is afebrile and hemodynamically stable on arrival.  Patient is adamant that she did not lose consciousness intra-articular mechanical mechanism however given she is elderly and it was unwitnessed and she is on a blood thinner CTA was obtained of the head that showed no evidence of intracranial hemorrhage.  X-rays of left hip and knee were patient is complaining of some discomfort her negative CT of  the knee obtained due to persistent severe pain on reassessment also is negative for acute fracture.  Patient is otherwise neurovascular intact throughout the left lower extremity and has minimal to no pain on  passive range of motion of the left hip and have very low suspicion for pelvic hemorrhage at this time.  Impression is contusion of the left knee and possible contusion of the left hip.  CMP shows no significant ocular or metabolic derangements.  CBC is unremarkable.  Patient treated with below noted analgesia and discharged stable condition back to nursing facility with written instructions to follow-up with her primary care physician for any persistent pain in the left knee.        ____________________________________________   FINAL CLINICAL IMPRESSION(S) / ED DIAGNOSES  Final diagnoses:  Contusion of left knee, initial encounter    Medications  fentaNYL (SUBLIMAZE) injection 50 mcg (50 mcg Intravenous Given 12/30/19 2309)  oxyCODONE-acetaminophen (PERCOCET/ROXICET) 5-325 MG per tablet 1 tablet (1 tablet Oral Given 12/31/19 0116)     ED Discharge Orders    None       Note:  This document was prepared using Dragon voice recognition software and may include unintentional dictation errors.   Lucrezia Starch, MD 12/31/19 [phone]

## 2019-12-31 ENCOUNTER — Emergency Department: Payer: Medicare PPO

## 2019-12-31 MED ORDER — HYDROCODONE-ACETAMINOPHEN 5-325 MG PO TABS: 1.0000 | ORAL_TABLET | Freq: Once | ORAL | Status: DC

## 2019-12-31 MED ORDER — OXYCODONE-ACETAMINOPHEN 5-325 MG PO TABS
1.0000 | ORAL_TABLET | Freq: Once | ORAL | Status: AC
  Administered 2019-12-31: 1 via ORAL
  Filled 2019-12-31: qty 1

## 2020-01-20 IMAGING — MG DIGITAL SCREENING BILATERAL MAMMOGRAM WITH TOMO AND CAD
8 series · 8 of 24 positions shown · non-contrast
Comparison: Previous exam(s).

ACR Breast Density Category a: The breast tissue is almost entirely
fatty.

CLINICAL DATA: Screening.

EXAM:
DIGITAL SCREENING BILATERAL MAMMOGRAM WITH TOMO AND CAD

[L MLO synth-2D]
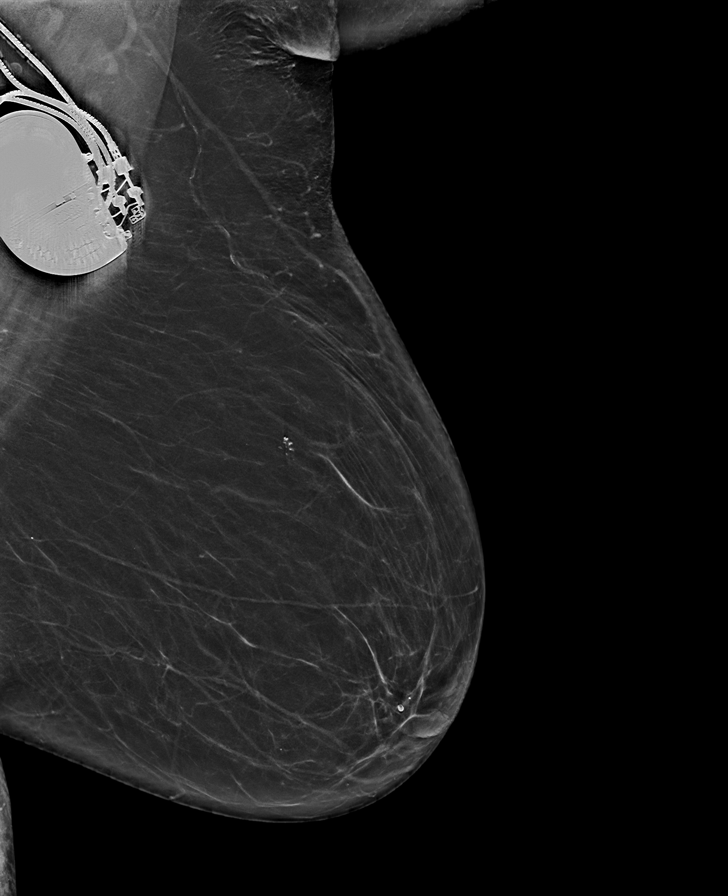

[R MLO synth-2D]
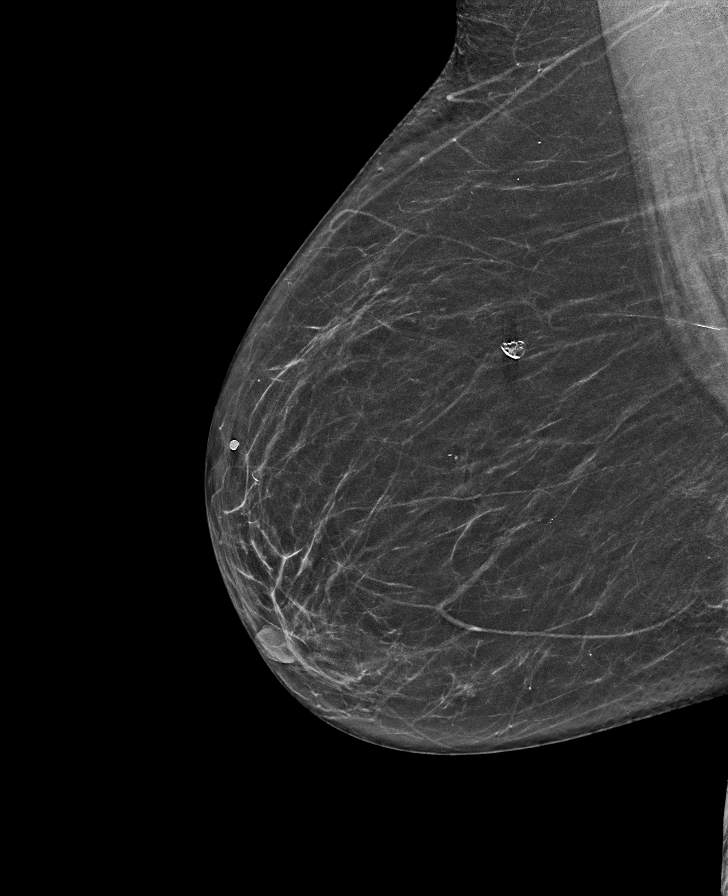

[R CC synth-2D]
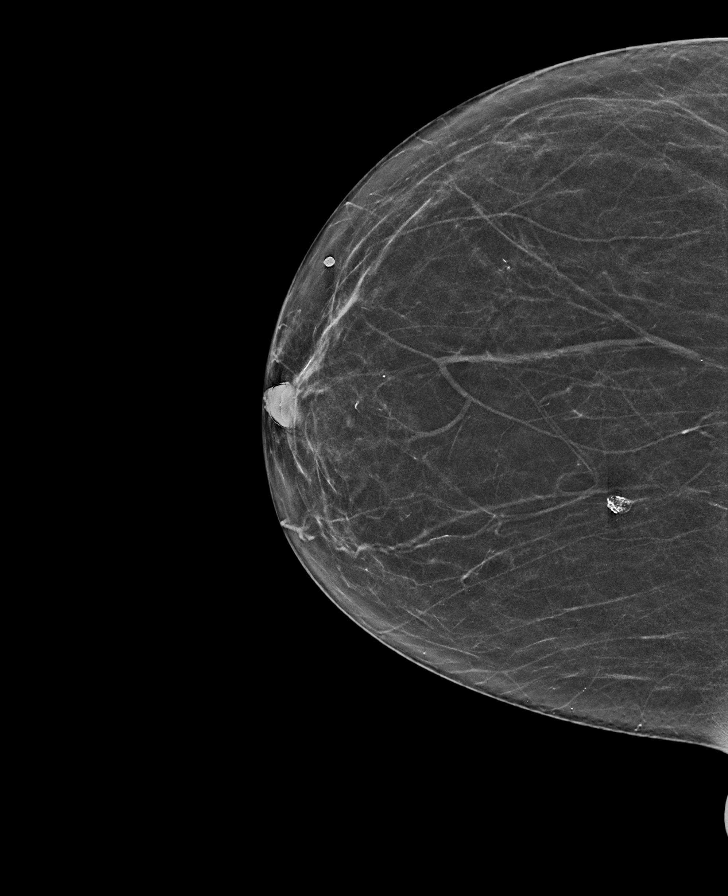

[L CC synth-2D]
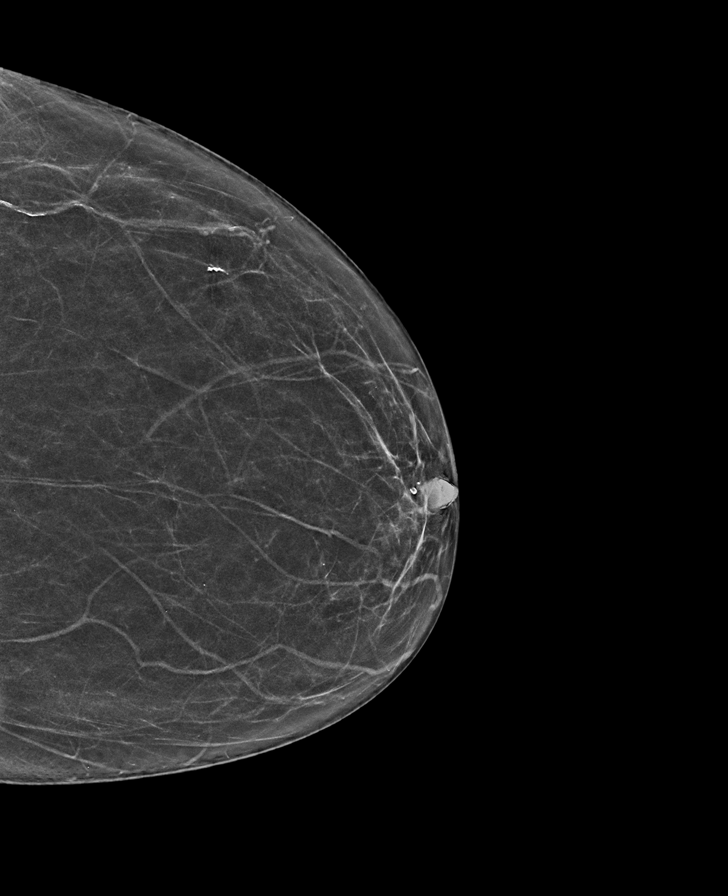

[L MLO tomo · tomo slice 37/72.0]
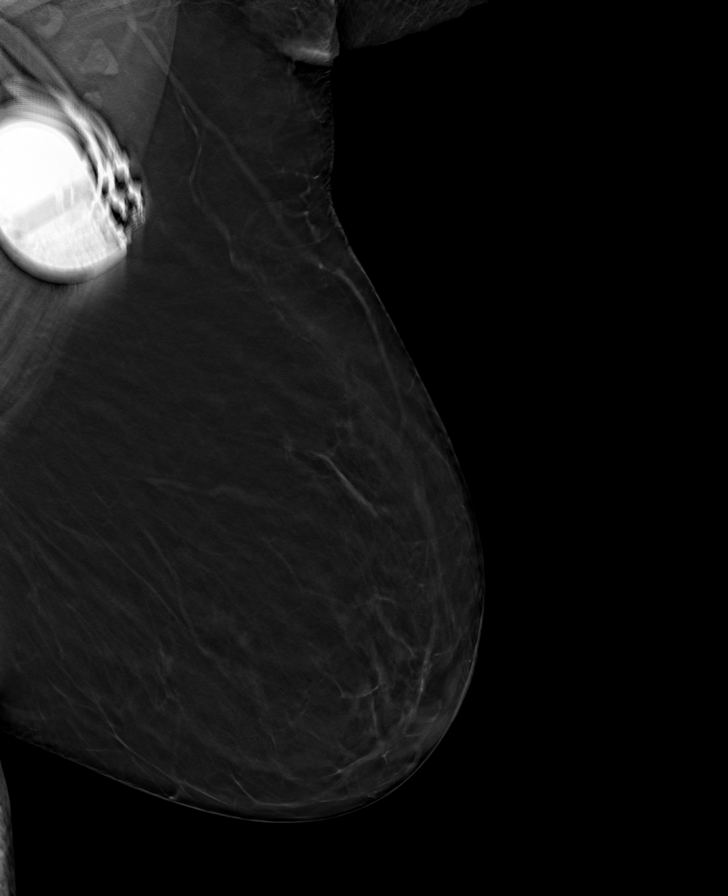

[R CC tomo · tomo slice 29/58.0]
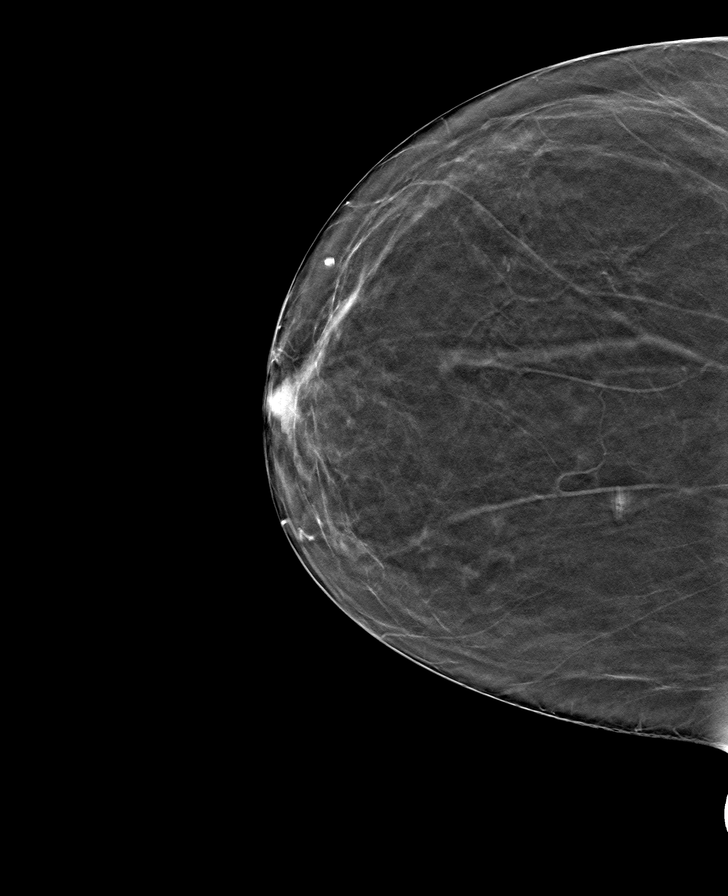

[L CC tomo · tomo slice 29/57.0]
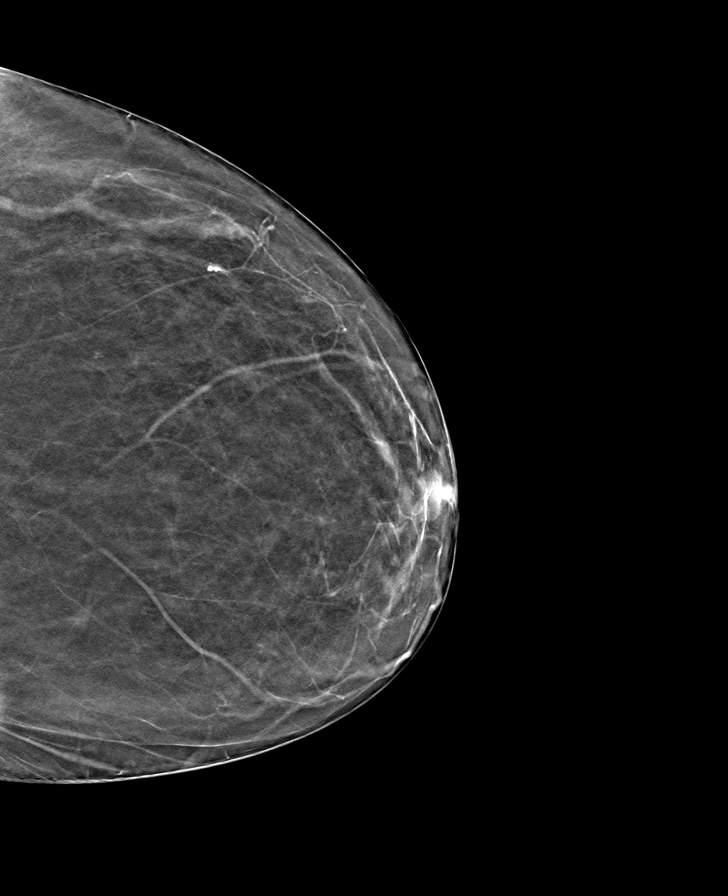

[R MLO tomo · tomo slice 32/63.0]
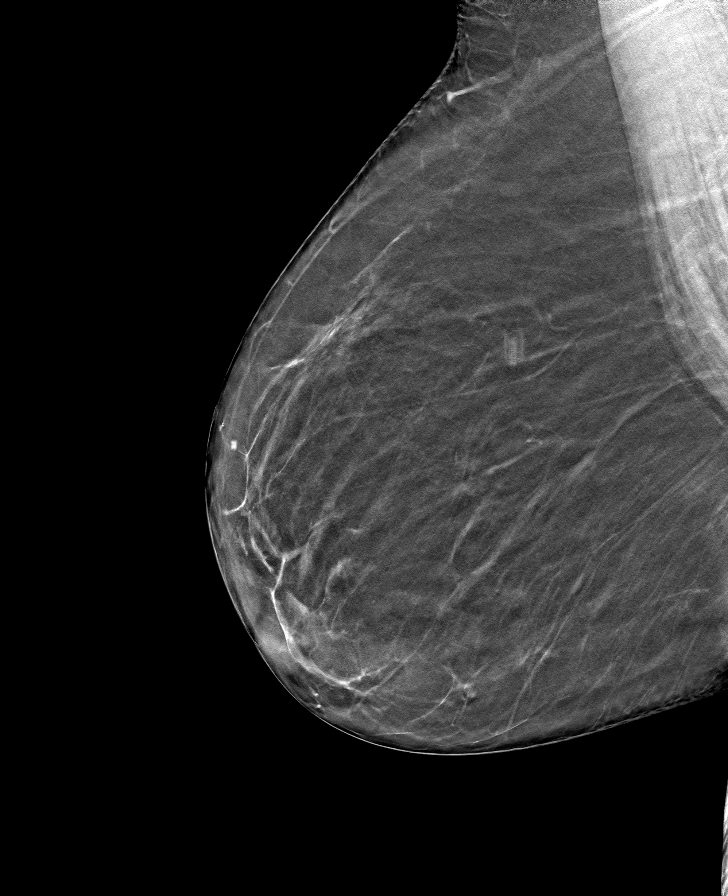

[8 of 24 positions shown; findings below may reference images not displayed]

FINDINGS: There are no findings suspicious for malignancy. Images were
processed with CAD.
IMPRESSION: No mammographic evidence of malignancy. A result letter of this
screening mammogram will be mailed directly to the patient.

RECOMMENDATION:
Screening mammogram in one year. (Code:[id number]-[id number])

BI-RADS CATEGORY  1: Negative.

## 2020-03-28 ENCOUNTER — Emergency Department: Payer: Medicare PPO

## 2020-03-28 ENCOUNTER — Emergency Department
Admission: EM | Admit: 2020-03-28 | Discharge: 2020-03-28 | Disposition: A | Discharge status: Home / Self Care | Payer: Medicare PPO | Attending: Emergency Medicine | Admitting: Emergency Medicine

## 2020-03-28 ENCOUNTER — Other Ambulatory Visit: Payer: Self-pay

## 2020-03-28 DIAGNOSIS — Z96642 Presence of left artificial hip joint: Secondary | ICD-10-CM | POA: Insufficient documentation

## 2020-03-28 DIAGNOSIS — E1169 Type 2 diabetes mellitus with other specified complication: Secondary | ICD-10-CM | POA: Diagnosis not present

## 2020-03-28 DIAGNOSIS — W19XXXA Unspecified fall, initial encounter: Secondary | ICD-10-CM | POA: Diagnosis not present

## 2020-03-28 DIAGNOSIS — Z20822 Contact with and (suspected) exposure to covid-19: Secondary | ICD-10-CM | POA: Diagnosis not present

## 2020-03-28 DIAGNOSIS — E039 Hypothyroidism, unspecified: Secondary | ICD-10-CM | POA: Insufficient documentation

## 2020-03-28 DIAGNOSIS — I480 Paroxysmal atrial fibrillation: Secondary | ICD-10-CM | POA: Diagnosis not present

## 2020-03-28 DIAGNOSIS — N183 Chronic kidney disease, stage 3 unspecified: Secondary | ICD-10-CM | POA: Diagnosis not present

## 2020-03-28 DIAGNOSIS — Z79899 Other long term (current) drug therapy: Secondary | ICD-10-CM | POA: Insufficient documentation

## 2020-03-28 DIAGNOSIS — E1122 Type 2 diabetes mellitus with diabetic chronic kidney disease: Secondary | ICD-10-CM | POA: Diagnosis not present

## 2020-03-28 DIAGNOSIS — Z7901 Long term (current) use of anticoagulants: Secondary | ICD-10-CM | POA: Insufficient documentation

## 2020-03-28 DIAGNOSIS — M79605 Pain in left leg: Secondary | ICD-10-CM | POA: Diagnosis present

## 2020-03-28 DIAGNOSIS — I6782 Cerebral ischemia: Secondary | ICD-10-CM | POA: Diagnosis not present

## 2020-03-28 DIAGNOSIS — I129 Hypertensive chronic kidney disease with stage 1 through stage 4 chronic kidney disease, or unspecified chronic kidney disease: Secondary | ICD-10-CM | POA: Insufficient documentation

## 2020-03-28 DIAGNOSIS — T148XXA Other injury of unspecified body region, initial encounter: Secondary | ICD-10-CM | POA: Diagnosis not present

## 2020-03-28 DIAGNOSIS — J45909 Unspecified asthma, uncomplicated: Secondary | ICD-10-CM | POA: Diagnosis not present

## 2020-03-28 DIAGNOSIS — Z85038 Personal history of other malignant neoplasm of large intestine: Secondary | ICD-10-CM | POA: Insufficient documentation

## 2020-03-28 DIAGNOSIS — Z87891 Personal history of nicotine dependence: Secondary | ICD-10-CM | POA: Insufficient documentation

## 2020-03-28 DIAGNOSIS — R059 Cough, unspecified: Secondary | ICD-10-CM | POA: Insufficient documentation

## 2020-03-28 DIAGNOSIS — T07XXXA Unspecified multiple injuries, initial encounter: Secondary | ICD-10-CM

## 2020-03-28 DIAGNOSIS — S0990XA Unspecified injury of head, initial encounter: Secondary | ICD-10-CM | POA: Diagnosis not present

## 2020-03-28 DIAGNOSIS — E785 Hyperlipidemia, unspecified: Secondary | ICD-10-CM | POA: Diagnosis not present

## 2020-03-28 DIAGNOSIS — Z950 Presence of cardiac pacemaker: Secondary | ICD-10-CM | POA: Diagnosis not present

## 2020-03-28 LAB — CBC WITH DIFFERENTIAL/PLATELET
Abs Immature Granulocytes: 0.02 10*3/uL (ref 0.00–0.07)
Basophils Absolute: 0 10*3/uL (ref 0.0–0.1)
Basophils Relative: 1 %
Eosinophils Absolute: 0.3 10*3/uL (ref 0.0–0.5)
Eosinophils Relative: 4 %
HCT: 33.8 % — ABNORMAL LOW (ref 36.0–46.0)
Hemoglobin: 10.6 g/dL — ABNORMAL LOW (ref 12.0–15.0)
Immature Granulocytes: 0 %
Lymphocytes Relative: 30 %
Lymphs Abs: 1.9 10*3/uL (ref 0.7–4.0)
MCH: 28 pg (ref 26.0–34.0)
MCHC: 31.4 g/dL (ref 30.0–36.0)
MCV: 89.4 fL (ref 80.0–100.0)
Monocytes Absolute: 0.6 10*3/uL (ref 0.1–1.0)
Monocytes Relative: 10 %
Neutro Abs: 3.4 10*3/uL (ref 1.7–7.7)
Neutrophils Relative %: 55 %
Platelets: 153 10*3/uL (ref 150–400)
RBC: 3.78 MIL/uL — ABNORMAL LOW (ref 3.87–5.11)
RDW: 16.9 % — ABNORMAL HIGH (ref 11.5–15.5)
WBC: 6.2 10*3/uL (ref 4.0–10.5)
nRBC: 0 % (ref 0.0–0.2)

## 2020-03-28 LAB — RESP PANEL BY RT-PCR (FLU A&B, COVID) ARPGX2
Influenza A by PCR: NEGATIVE
Influenza B by PCR: NEGATIVE
SARS Coronavirus 2 by RT PCR: NEGATIVE

## 2020-03-28 LAB — COMPREHENSIVE METABOLIC PANEL
ALT: 11 U/L (ref 0–44)
AST: 21 U/L (ref 15–41)
Albumin: 3 g/dL — ABNORMAL LOW (ref 3.5–5.0)
Alkaline Phosphatase: 79 U/L (ref 38–126)
Anion gap: 8 (ref 5–15)
BUN: 20 mg/dL (ref 8–23)
CO2: 24 mmol/L (ref 22–32)
Calcium: 9.1 mg/dL (ref 8.9–10.3)
Chloride: 107 mmol/L (ref 98–111)
Creatinine, Ser: 1.21 mg/dL — ABNORMAL HIGH (ref 0.44–1.00)
GFR, Estimated: 44 mL/min — ABNORMAL LOW (ref >60)
Glucose, Bld: 94 mg/dL (ref 70–99)
Potassium: 3.9 mmol/L (ref 3.5–5.1)
Sodium: 139 mmol/L (ref 135–145)
Total Bilirubin: 0.7 mg/dL (ref 0.3–1.2)
Total Protein: 6.2 g/dL — ABNORMAL LOW (ref 6.5–8.1)

## 2020-03-28 LAB — URINALYSIS, COMPLETE (UACMP) WITH MICROSCOPIC
Bilirubin Urine: NEGATIVE
Glucose, UA: NEGATIVE mg/dL
Hgb urine dipstick: NEGATIVE
Ketones, ur: NEGATIVE mg/dL
Nitrite: NEGATIVE
Protein, ur: NEGATIVE mg/dL
Specific Gravity, Urine: 1.012 (ref 1.005–1.030)
pH: 5 (ref 5.0–8.0)

## 2020-03-28 LAB — TROPONIN I (HIGH SENSITIVITY): Troponin I (High Sensitivity): 5 ng/L (ref <18)

## 2020-03-28 MED ORDER — TRAMADOL HCL 50 MG PO TABS
50.0000 mg | ORAL_TABLET | Freq: Once | ORAL | Status: AC
  Administered 2020-03-28: 50 mg via ORAL
  Filled 2020-03-28: qty 1

## 2020-03-28 NOTE — ED Triage Notes (Signed)
Pt presents to ED via EMS due to fall, pt c/o of L knee pain

## 2020-03-28 NOTE — ED Provider Notes (Signed)
[facility] Emergency Department Provider Note  ____________________________________________   Event Date/Time   First MD Initiated Contact with Patient 03/28/20 1714     (approximate)  I have reviewed the triage vital signs and the nursing notes.   HISTORY  Chief Complaint Fall    HPI Krystal Goodwin is a 87 y.o. female presents to the ED via EMS from [place] cough.  Patient had a fall earlier today.  She has been unable to bear weight since the fall.  Unsure if she hit her head.  Patient does take Eliquis.  EMS states she has a history of a left hip fracture and left femur fracture.   I also states that her leg is rotated out.  Pain is rated at 9 out of 10   Past Medical History:  Diagnosis Date  . Anemia    iron def.anemia  . Aortic stenosis, severe   . Asthma   . Chest pain   . Colon cancer ([place])   . Complication of anesthesia    nausea  . Diabetes mellitus without complication ([place])   . Dyslipidemia   . Dyspnea   . Gout, joint   . H/O postmenopausal osteoporosis   . Hematuria   . History of palpitations   . Hx of colonic polyps   . Hypertension   . Hypothyroidism   . IBS (irritable bowel syndrome)   . Schatzki's ring   . Sleep apnea     Patient Active Problem List   Diagnosis Date Noted  . Closed fracture of left hip ([place]) 09/05/2019  . Diabetes mellitus type II, non insulin dependent ([place]) 09/05/2019  . Hypothyroidism   . CKD (chronic kidney disease), stage III ([place])   . Hypertension   . Sleep apnea   . PAF (paroxysmal atrial fibrillation) ([place])   . Fall     Past Surgical History:  Procedure Laterality Date  . AORTIC VALVE REPLACEMENT    . CARDIAC PACEMAKER PLACEMENT    . CHOLECYSTECTOMY    . COLON SURGERY  1997   RIGHT HEMICOLECTOMY  . COLONOSCOPY    . COLONOSCOPY WITH PROPOFOL N/A 09/07/2016   Procedure: COLONOSCOPY WITH PROPOFOL;  Surgeon: Manya Silvas, MD;  Location[facility]COPY;  Service: Endoscopy;   Laterality: N/A;  . ESOPHAGOGASTRODUODENOSCOPY    . HIP ARTHROPLASTY Left 09/05/2019   Procedure: ARTHROPLASTY BIPOLAR HIP (HEMIARTHROPLASTY);  Surgeon: Corky Mull, MD;  Location: [facility] ORS;  Service: Orthopedics;  Laterality: Left;  . TEE WITHOUT CARDIOVERSION  03/08/2013    Prior to Admission medications   Medication Sig Start Date End Date Taking? Authorizing Provider  ALPRAZolam (XANAX) 0.25 MG tablet Take 1-2 tablets (0.25-0.5 mg total) by mouth every 4 (four) hours as needed for anxiety or sleep. 09/09/19   Fritzi Mandes, MD  atorvastatin (LIPITOR) 10 MG tablet Take 10 mg by mouth daily. 06/20/19   [provider]  citalopram (CELEXA) 20 MG tablet Take 20 mg by mouth daily. 08/21/19   [provider]  ELIQUIS 5 MG TABS tablet Take 5 mg by mouth 2 (two) times daily. 08/21/19   [provider]  ferrous sulfate 325 (65 FE) MG tablet Take 325 mg by mouth daily with breakfast.    [provider]  furosemide (LASIX) 40 MG tablet Take 40 mg by mouth.    [provider]  HYDROcodone-acetaminophen (NORCO/VICODIN) 5-325 MG tablet Take 1 tablet by mouth every 4 (four) hours as needed for moderate pain. 09/06/19  Lattie Corns, PA-C  levothyroxine (SYNTHROID) 100 MCG tablet Take 100 mcg by mouth daily before breakfast.     [provider]  losartan (COZAAR) 25 MG tablet Take 1 tablet (25 mg total) by mouth daily. 09/09/19   Fritzi Mandes, MD  Melatonin-Pyridoxine 1-10 MG TABS Take by mouth at bedtime as needed.    [provider]  metoprolol succinate (TOPROL-XL) 25 MG 24 hr tablet Take 25 mg by mouth daily. 06/20/19   [provider]  Multiple Vitamin (MULTIVITAMIN) tablet Take 1 tablet by mouth daily.    [provider]  pantoprazole (PROTONIX) 40 MG tablet Take 40 mg by mouth daily.    [provider]  traMADol (ULTRAM) 50 MG tablet Take 1 tablet (50 mg total) by mouth every 6 (six) hours as needed for moderate  pain. 09/06/19   Lattie Corns, PA-C    Allergies Amoxicillin, Erythromycin, and Vicodin [hydrocodone-acetaminophen]  Family History  Problem Relation Age of Onset  . Breast cancer Neg Hx     Social History Social History   Tobacco Use  . Smoking status: Former Smoker    Packs/day: 1.00    Years: 20.00    Pack years: 20.00    Types: Cigarettes    Quit date: 02/21/1989    Years since quitting: 31.1  . Smokeless tobacco: Never Used  Vaping Use  . Vaping Use: Never used  Substance Use Topics  . Alcohol use: No  . Drug use: No    Review of Systems  Constitutional: No fever/chills Eyes: No visual changes. ENT: No sore throat. Respiratory: Denies cough Cardiovascular: Denies chest pain Gastrointestinal: Denies abdominal pain Genitourinary: Negative for dysuria. Musculoskeletal: Negative for back pain.  Positive for left leg pain Skin: Negative for rash. Psychiatric: no mood changes,     ____________________________________________   PHYSICAL EXAM:  VITAL SIGNS: ED Triage Vitals  Enc Vitals Group     BP 03/28/20 1717 (!) 136/50     Pulse Rate 03/28/20 1717 70     Resp 03/28/20 1717 19     Temp 03/28/20 1717 98 F (36.7 C)     Temp Source 03/28/20 1717 Axillary     SpO2 03/28/20 1717 99 %     Weight --      Height --      Head Circumference --      Peak Flow --      Pain Score 03/28/20 1719 9     Pain Loc --      Pain Edu? --      Excl. in [place]? --     Constitutional: Alert and oriented. Well appearing and in no acute distress. Eyes: Conjunctivae are normal.  Head: Atraumatic. Nose: No congestion/rhinnorhea. Mouth/Throat: Mucous membranes are moist.   Neck:  supple no lymphadenopathy noted Cardiovascular: Normal rate, regular rhythm. Heart sounds are normal Respiratory: Normal respiratory effort.  No retractions, lungs c t a  Abd: soft nontender bs normal all 4 quad, rash noted in the inguinal area along the left side GU:  deferred Musculoskeletal: Left hip is not tender to palpation, left femur is mildly tender to palpation, left knee is tender, left tib-fib area is bright red with some tenderness noted, leg is rotated laterally neurologic:  Normal speech and language.  Skin:  Skin is warm, dry and intact. No rash noted. Psychiatric: Mood and affect are normal. Speech and behavior are normal.  ____________________________________________   LABS (all labs ordered are listed, but only abnormal  results are displayed)  Labs Reviewed  COMPREHENSIVE METABOLIC PANEL - Abnormal; Notable for the following components:      Result Value   Creatinine, Ser 1.21 (*)    Total Protein 6.2 (*)    Albumin 3.0 (*)    GFR, Estimated 44 (*)    All other components within normal limits  CBC WITH DIFFERENTIAL/PLATELET - Abnormal; Notable for the following components:   RBC 3.78 (*)    Hemoglobin 10.6 (*)    HCT 33.8 (*)    RDW 16.9 (*)    All other components within normal limits  URINALYSIS, COMPLETE (UACMP) WITH MICROSCOPIC - Abnormal; Notable for the following components:   Color, Urine YELLOW (*)    APPearance CLEAR (*)    Leukocytes,Ua SMALL (*)    Bacteria, UA RARE (*)    All other components within normal limits  RESP PANEL BY RT-PCR (FLU A&B, COVID) ARPGX2  TROPONIN I (HIGH SENSITIVITY)   ____________________________________________   ____________________________________________  RADIOLOGY  CT of the head X-ray of the pelvis, left femur, tib-fib  ____________________________________________   PROCEDURES  Procedure(s) performed: No  Procedures    ____________________________________________   INITIAL IMPRESSION / ASSESSMENT AND PLAN / ED COURSE  Pertinent labs & imaging results that were available during my care of the patient were reviewed by me and considered in my medical decision making (see chart for details).   Patient is a 87 year old female presents via EMS from likely home.  See  HPI.  Physical exam shows a patient to most likely either have a femur or tibia fracture.  DDx: Subdural hematoma, subarachnoid hemorrhage, hip fracture, femur fracture, tibia fracture  Labs are reassuring, comprehensive metabolic panel with elevated creatinine of 1.2, CBC has a low hemoglobin of 10.6 which when reviewing old lab results is normal for her, troponin is normal, UA is normal    Clinical Course as of 03/28/20 2014  Sat Mar 28, 2020  1843 DG Tibia/Fibula Left [PS]    Clinical Course User Index [PS] Carrie Mew, MD   ----------------------------------------- 6:49 PM on 03/28/2020 -----------------------------------------  Family member in the room.  States patient fell while transferring from chair to chair.  States she does not normally walk due to chronic knee pain.  States that she has been complaining about her back and that was the original complaint.  We will do CT of the lumbar and T-spine.  ----------------------------------------- 8:10 PM on 03/28/2020 -----------------------------------------  CT of the T-spine and L-spine are both negative.  Did explain everything to the family.  Patient will be discharged and is appropriate for [facility] via ems   SANYLA SUMMEY was evaluated in Emergency Department on 03/28/2020 for the symptoms described in the history of present illness. She was evaluated in the context of the global COVID-19 pandemic, which necessitated consideration that the patient might be at risk for infection with the SARS-CoV-2 virus that causes COVID-19. Institutional protocols and algorithms that pertain to the evaluation of patients at risk for COVID-19 are in a state of rapid change based on information released by regulatory bodies including the CDC and federal and state organizations. These policies and algorithms were followed during the patient's care in the ED.    As part of my medical decision making, I reviewed the following data  within the [place] History obtained from family, Nursing notes reviewed and incorporated, Labs reviewed , EKG interpreted see physician read, Old chart reviewed, Radiograph reviewed , Notes from prior ED  visits and [place] Controlled Substance Database  ____________________________________________   FINAL CLINICAL IMPRESSION(S) / ED DIAGNOSES  Final diagnoses:  Fall, initial encounter  Multiple contusions      NEW MEDICATIONS STARTED DURING THIS VISIT:  New Prescriptions   No medications on file     Note:  This document was prepared using Dragon voice recognition software and may include unintentional dictation errors.    Versie Starks, PA-C 03/28/20 2014    Carrie Mew, MD 03/29/20 [phone]

## 2020-03-28 NOTE — ED Notes (Signed)
Request made for [facility] to transport.

## 2020-03-28 NOTE — ED Notes (Signed)
Pt presents to ED from [facility] which is an assisted living facility. EMS states pt tripped over her own feet. Pt c/o of L knee pain. EMS states rotation of L leg, pt presents with pillow underneath her knee at this time. Pt is A&Ox4. NAD noted. Pt has good L pedal pulse.

## 2020-04-23 DIAGNOSIS — I480 Paroxysmal atrial fibrillation: Secondary | ICD-10-CM | POA: Diagnosis not present

## 2020-04-23 DIAGNOSIS — I700 Atherosclerosis of aorta: Secondary | ICD-10-CM

## 2020-04-23 DIAGNOSIS — E1159 Type 2 diabetes mellitus with other circulatory complications: Secondary | ICD-10-CM

## 2020-04-23 DIAGNOSIS — F39 Unspecified mood [affective] disorder: Secondary | ICD-10-CM

## 2020-04-23 DIAGNOSIS — K219 Gastro-esophageal reflux disease without esophagitis: Secondary | ICD-10-CM

## 2020-04-23 DIAGNOSIS — M159 Polyosteoarthritis, unspecified: Secondary | ICD-10-CM | POA: Diagnosis not present

## 2020-04-23 DIAGNOSIS — M6281 Muscle weakness (generalized): Secondary | ICD-10-CM | POA: Diagnosis not present

## 2020-04-23 DIAGNOSIS — N1832 Chronic kidney disease, stage 3b: Secondary | ICD-10-CM | POA: Diagnosis not present

## 2020-05-07 DIAGNOSIS — M1712 Unilateral primary osteoarthritis, left knee: Secondary | ICD-10-CM | POA: Diagnosis not present

## 2020-06-01 DIAGNOSIS — F0150 Vascular dementia without behavioral disturbance: Secondary | ICD-10-CM

## 2020-06-01 DIAGNOSIS — I480 Paroxysmal atrial fibrillation: Secondary | ICD-10-CM | POA: Diagnosis not present

## 2020-06-01 DIAGNOSIS — F39 Unspecified mood [affective] disorder: Secondary | ICD-10-CM

## 2020-06-01 DIAGNOSIS — N1832 Chronic kidney disease, stage 3b: Secondary | ICD-10-CM

## 2020-06-01 DIAGNOSIS — E1159 Type 2 diabetes mellitus with other circulatory complications: Secondary | ICD-10-CM | POA: Diagnosis not present

## 2020-06-01 DIAGNOSIS — M179 Osteoarthritis of knee, unspecified: Secondary | ICD-10-CM | POA: Diagnosis not present

## 2020-06-03 DIAGNOSIS — B351 Tinea unguium: Secondary | ICD-10-CM | POA: Diagnosis not present

## 2020-06-16 DIAGNOSIS — K1370 Unspecified lesions of oral mucosa: Secondary | ICD-10-CM | POA: Diagnosis not present

## 2020-06-17 DIAGNOSIS — L299 Pruritus, unspecified: Secondary | ICD-10-CM

## 2020-06-17 DIAGNOSIS — E079 Disorder of thyroid, unspecified: Secondary | ICD-10-CM

## 2020-06-17 DIAGNOSIS — F39 Unspecified mood [affective] disorder: Secondary | ICD-10-CM

## 2020-06-17 DIAGNOSIS — F0150 Vascular dementia without behavioral disturbance: Secondary | ICD-10-CM

## 2020-06-17 DIAGNOSIS — N183 Chronic kidney disease, stage 3 unspecified: Secondary | ICD-10-CM | POA: Diagnosis not present

## 2020-06-17 DIAGNOSIS — I700 Atherosclerosis of aorta: Secondary | ICD-10-CM

## 2020-06-17 DIAGNOSIS — K219 Gastro-esophageal reflux disease without esophagitis: Secondary | ICD-10-CM

## 2020-06-17 DIAGNOSIS — I4891 Unspecified atrial fibrillation: Secondary | ICD-10-CM | POA: Diagnosis not present

## 2020-06-17 DIAGNOSIS — M1990 Unspecified osteoarthritis, unspecified site: Secondary | ICD-10-CM | POA: Diagnosis not present

## 2020-07-24 DIAGNOSIS — L299 Pruritus, unspecified: Secondary | ICD-10-CM

## 2020-07-24 DIAGNOSIS — I872 Venous insufficiency (chronic) (peripheral): Secondary | ICD-10-CM

## 2020-07-24 DIAGNOSIS — I4891 Unspecified atrial fibrillation: Secondary | ICD-10-CM

## 2020-07-24 DIAGNOSIS — F0150 Vascular dementia without behavioral disturbance: Secondary | ICD-10-CM

## 2020-07-24 DIAGNOSIS — E039 Hypothyroidism, unspecified: Secondary | ICD-10-CM

## 2020-07-24 DIAGNOSIS — K219 Gastro-esophageal reflux disease without esophagitis: Secondary | ICD-10-CM

## 2020-07-24 DIAGNOSIS — E1151 Type 2 diabetes mellitus with diabetic peripheral angiopathy without gangrene: Secondary | ICD-10-CM

## 2020-07-24 DIAGNOSIS — N183 Chronic kidney disease, stage 3 unspecified: Secondary | ICD-10-CM

## 2020-07-24 DIAGNOSIS — M1990 Unspecified osteoarthritis, unspecified site: Secondary | ICD-10-CM

## 2020-08-11 DIAGNOSIS — M159 Polyosteoarthritis, unspecified: Secondary | ICD-10-CM | POA: Diagnosis not present

## 2020-08-12 DIAGNOSIS — B351 Tinea unguium: Secondary | ICD-10-CM

## 2020-08-31 DIAGNOSIS — M170 Bilateral primary osteoarthritis of knee: Secondary | ICD-10-CM

## 2020-09-15 DIAGNOSIS — M1712 Unilateral primary osteoarthritis, left knee: Secondary | ICD-10-CM | POA: Diagnosis not present

## 2020-09-28 DIAGNOSIS — F11288 Opioid dependence with other opioid-induced disorder: Secondary | ICD-10-CM

## 2020-09-28 DIAGNOSIS — F0150 Vascular dementia without behavioral disturbance: Secondary | ICD-10-CM

## 2020-09-28 DIAGNOSIS — F39 Unspecified mood [affective] disorder: Secondary | ICD-10-CM

## 2020-09-28 DIAGNOSIS — M179 Osteoarthritis of knee, unspecified: Secondary | ICD-10-CM

## 2020-09-28 DIAGNOSIS — N1832 Chronic kidney disease, stage 3b: Secondary | ICD-10-CM

## 2020-09-28 DIAGNOSIS — E1121 Type 2 diabetes mellitus with diabetic nephropathy: Secondary | ICD-10-CM

## 2020-10-06 DIAGNOSIS — R231 Pallor: Secondary | ICD-10-CM | POA: Diagnosis not present

## 2020-10-08 ENCOUNTER — Emergency Department: Payer: Medicare PPO

## 2020-10-08 ENCOUNTER — Encounter: Payer: Self-pay | Admitting: Emergency Medicine

## 2020-10-08 ENCOUNTER — Other Ambulatory Visit: Payer: Self-pay

## 2020-10-08 ENCOUNTER — Inpatient Hospital Stay
Admission: EM | Admit: 2020-10-08 | Discharge: 2020-10-13 | DRG: 378 | Disposition: A | Discharge status: Skilled Nursing Facility | Payer: Medicare PPO | Source: Skilled Nursing Facility | Attending: Internal Medicine | Admitting: Internal Medicine

## 2020-10-08 DIAGNOSIS — I5032 Chronic diastolic (congestive) heart failure: Secondary | ICD-10-CM | POA: Diagnosis present

## 2020-10-08 DIAGNOSIS — R627 Adult failure to thrive: Secondary | ICD-10-CM | POA: Diagnosis present

## 2020-10-08 DIAGNOSIS — Z6832 Body mass index (BMI) 32.0-32.9, adult: Secondary | ICD-10-CM

## 2020-10-08 DIAGNOSIS — M109 Gout, unspecified: Secondary | ICD-10-CM | POA: Diagnosis present

## 2020-10-08 DIAGNOSIS — G8929 Other chronic pain: Secondary | ICD-10-CM | POA: Diagnosis present

## 2020-10-08 DIAGNOSIS — I358 Other nonrheumatic aortic valve disorders: Secondary | ICD-10-CM | POA: Diagnosis present

## 2020-10-08 DIAGNOSIS — K449 Diaphragmatic hernia without obstruction or gangrene: Secondary | ICD-10-CM | POA: Diagnosis present

## 2020-10-08 DIAGNOSIS — Z85038 Personal history of other malignant neoplasm of large intestine: Secondary | ICD-10-CM

## 2020-10-08 DIAGNOSIS — E785 Hyperlipidemia, unspecified: Secondary | ICD-10-CM | POA: Diagnosis present

## 2020-10-08 DIAGNOSIS — H9190 Unspecified hearing loss, unspecified ear: Secondary | ICD-10-CM | POA: Diagnosis present

## 2020-10-08 DIAGNOSIS — Z950 Presence of cardiac pacemaker: Secondary | ICD-10-CM

## 2020-10-08 DIAGNOSIS — M810 Age-related osteoporosis without current pathological fracture: Secondary | ICD-10-CM | POA: Diagnosis present

## 2020-10-08 DIAGNOSIS — E876 Hypokalemia: Secondary | ICD-10-CM | POA: Diagnosis present

## 2020-10-08 DIAGNOSIS — Z66 Do not resuscitate: Secondary | ICD-10-CM | POA: Diagnosis not present

## 2020-10-08 DIAGNOSIS — K2901 Acute gastritis with bleeding: Secondary | ICD-10-CM | POA: Diagnosis present

## 2020-10-08 DIAGNOSIS — G4733 Obstructive sleep apnea (adult) (pediatric): Secondary | ICD-10-CM | POA: Diagnosis present

## 2020-10-08 DIAGNOSIS — F0390 Unspecified dementia without behavioral disturbance: Secondary | ICD-10-CM | POA: Diagnosis present

## 2020-10-08 DIAGNOSIS — K922 Gastrointestinal hemorrhage, unspecified: Secondary | ICD-10-CM | POA: Diagnosis not present

## 2020-10-08 DIAGNOSIS — K254 Chronic or unspecified gastric ulcer with hemorrhage: Principal | ICD-10-CM | POA: Diagnosis present

## 2020-10-08 DIAGNOSIS — E1122 Type 2 diabetes mellitus with diabetic chronic kidney disease: Secondary | ICD-10-CM | POA: Diagnosis present

## 2020-10-08 DIAGNOSIS — Z952 Presence of prosthetic heart valve: Secondary | ICD-10-CM

## 2020-10-08 DIAGNOSIS — N1832 Chronic kidney disease, stage 3b: Secondary | ICD-10-CM | POA: Diagnosis present

## 2020-10-08 DIAGNOSIS — Z20822 Contact with and (suspected) exposure to covid-19: Secondary | ICD-10-CM | POA: Diagnosis present

## 2020-10-08 DIAGNOSIS — D509 Iron deficiency anemia, unspecified: Secondary | ICD-10-CM

## 2020-10-08 DIAGNOSIS — N179 Acute kidney failure, unspecified: Secondary | ICD-10-CM | POA: Diagnosis present

## 2020-10-08 DIAGNOSIS — I442 Atrioventricular block, complete: Secondary | ICD-10-CM | POA: Diagnosis present

## 2020-10-08 DIAGNOSIS — Z79899 Other long term (current) drug therapy: Secondary | ICD-10-CM

## 2020-10-08 DIAGNOSIS — Z96642 Presence of left artificial hip joint: Secondary | ICD-10-CM | POA: Diagnosis present

## 2020-10-08 DIAGNOSIS — I130 Hypertensive heart and chronic kidney disease with heart failure and stage 1 through stage 4 chronic kidney disease, or unspecified chronic kidney disease: Secondary | ICD-10-CM | POA: Diagnosis present

## 2020-10-08 DIAGNOSIS — I480 Paroxysmal atrial fibrillation: Secondary | ICD-10-CM | POA: Diagnosis not present

## 2020-10-08 DIAGNOSIS — E039 Hypothyroidism, unspecified: Secondary | ICD-10-CM | POA: Diagnosis present

## 2020-10-08 DIAGNOSIS — D500 Iron deficiency anemia secondary to blood loss (chronic): Secondary | ICD-10-CM | POA: Diagnosis not present

## 2020-10-08 DIAGNOSIS — K921 Melena: Secondary | ICD-10-CM | POA: Diagnosis not present

## 2020-10-08 DIAGNOSIS — M1710 Unilateral primary osteoarthritis, unspecified knee: Secondary | ICD-10-CM | POA: Diagnosis present

## 2020-10-08 DIAGNOSIS — F0150 Vascular dementia without behavioral disturbance: Secondary | ICD-10-CM | POA: Diagnosis not present

## 2020-10-08 DIAGNOSIS — Z515 Encounter for palliative care: Secondary | ICD-10-CM | POA: Diagnosis not present

## 2020-10-08 DIAGNOSIS — Z993 Dependence on wheelchair: Secondary | ICD-10-CM

## 2020-10-08 DIAGNOSIS — Z7189 Other specified counseling: Secondary | ICD-10-CM | POA: Diagnosis not present

## 2020-10-08 DIAGNOSIS — J45909 Unspecified asthma, uncomplicated: Secondary | ICD-10-CM | POA: Diagnosis present

## 2020-10-08 DIAGNOSIS — Z8781 Personal history of (healed) traumatic fracture: Secondary | ICD-10-CM

## 2020-10-08 DIAGNOSIS — D62 Acute posthemorrhagic anemia: Secondary | ICD-10-CM | POA: Diagnosis present

## 2020-10-08 DIAGNOSIS — Z9119 Patient's noncompliance with other medical treatment and regimen: Secondary | ICD-10-CM

## 2020-10-08 DIAGNOSIS — D649 Anemia, unspecified: Secondary | ICD-10-CM

## 2020-10-08 DIAGNOSIS — Z86010 Personal history of colonic polyps: Secondary | ICD-10-CM

## 2020-10-08 DIAGNOSIS — M25562 Pain in left knee: Secondary | ICD-10-CM | POA: Diagnosis present

## 2020-10-08 DIAGNOSIS — G8928 Other chronic postprocedural pain: Secondary | ICD-10-CM

## 2020-10-08 DIAGNOSIS — Z87891 Personal history of nicotine dependence: Secondary | ICD-10-CM

## 2020-10-08 DIAGNOSIS — Z9049 Acquired absence of other specified parts of digestive tract: Secondary | ICD-10-CM

## 2020-10-08 DIAGNOSIS — R0602 Shortness of breath: Secondary | ICD-10-CM

## 2020-10-08 DIAGNOSIS — M25561 Pain in right knee: Secondary | ICD-10-CM | POA: Diagnosis present

## 2020-10-08 DIAGNOSIS — Z7901 Long term (current) use of anticoagulants: Secondary | ICD-10-CM

## 2020-10-08 LAB — COMPREHENSIVE METABOLIC PANEL
ALT: 13 U/L (ref 0–44)
AST: 28 U/L (ref 15–41)
Albumin: 3.3 g/dL — ABNORMAL LOW (ref 3.5–5.0)
Alkaline Phosphatase: 65 U/L (ref 38–126)
Anion gap: 8 (ref 5–15)
BUN: 37 mg/dL — ABNORMAL HIGH (ref 8–23)
CO2: 18 mmol/L — ABNORMAL LOW (ref 22–32)
Calcium: 8.8 mg/dL — ABNORMAL LOW (ref 8.9–10.3)
Chloride: 110 mmol/L (ref 98–111)
Creatinine, Ser: 1.6 mg/dL — ABNORMAL HIGH (ref 0.44–1.00)
GFR, Estimated: 31 mL/min — ABNORMAL LOW (ref >60)
Glucose, Bld: 94 mg/dL (ref 70–99)
Potassium: 4.7 mmol/L (ref 3.5–5.1)
Sodium: 136 mmol/L (ref 135–145)
Total Bilirubin: 0.6 mg/dL (ref 0.3–1.2)
Total Protein: 6.3 g/dL — ABNORMAL LOW (ref 6.5–8.1)

## 2020-10-08 LAB — CBC
HCT: 16.9 % — ABNORMAL LOW (ref 36.0–46.0)
Hemoglobin: 4.7 g/dL — CL (ref 12.0–15.0)
MCH: 21.8 pg — ABNORMAL LOW (ref 26.0–34.0)
MCHC: 27.8 g/dL — ABNORMAL LOW (ref 30.0–36.0)
MCV: 78.2 fL — ABNORMAL LOW (ref 80.0–100.0)
Platelets: 203 10*3/uL (ref 150–400)
RBC: 2.16 MIL/uL — ABNORMAL LOW (ref 3.87–5.11)
RDW: 17.1 % — ABNORMAL HIGH (ref 11.5–15.5)
WBC: 7.1 10*3/uL (ref 4.0–10.5)
nRBC: 0.6 % — ABNORMAL HIGH (ref 0.0–0.2)

## 2020-10-08 LAB — RESP PANEL BY RT-PCR (FLU A&B, COVID) ARPGX2
Influenza A by PCR: NEGATIVE
Influenza B by PCR: NEGATIVE
SARS Coronavirus 2 by RT PCR: NEGATIVE

## 2020-10-08 MED ORDER — OCTREOTIDE LOAD VIA INFUSION: 50.0000 ug | Freq: Once | INTRAVENOUS | Status: DC

## 2020-10-08 MED ORDER — PANTOPRAZOLE INFUSION (NEW) - SIMPLE MED
8.0000 mg/h | INTRAVENOUS | Status: AC
  Administered 2020-10-08 – 2020-10-11 (×5): 8 mg/h via INTRAVENOUS
  Filled 2020-10-08: qty 80
  Filled 2020-10-08: qty 100
  Filled 2020-10-08 (×2): qty 80
  Filled 2020-10-08: qty 100
  Filled 2020-10-08 (×2): qty 80

## 2020-10-08 MED ORDER — PANTOPRAZOLE SODIUM 40 MG IV SOLR
40.0000 mg | Freq: Two times a day (BID) | INTRAVENOUS | Status: DC
  Administered 2020-10-12: 40 mg via INTRAVENOUS
  Filled 2020-10-08 (×2): qty 40

## 2020-10-08 MED ORDER — SODIUM CHLORIDE 0.9 % IV SOLN: INTRAVENOUS | Status: DC

## 2020-10-08 MED ORDER — SODIUM CHLORIDE 0.9% FLUSH
3.0000 mL | Freq: Two times a day (BID) | INTRAVENOUS | Status: DC
  Administered 2020-10-08 – 2020-10-13 (×8): 3 mL via INTRAVENOUS

## 2020-10-08 MED ORDER — SODIUM CHLORIDE 0.9 % IV SOLN
10.0000 mL/h | Freq: Once | INTRAVENOUS | Status: AC
  Administered 2020-10-08: 10 mL/h via INTRAVENOUS

## 2020-10-08 MED ORDER — DICLOFENAC SODIUM 1 % EX GEL
4.0000 g | Freq: Four times a day (QID) | CUTANEOUS | Status: DC
  Administered 2020-10-08 – 2020-10-13 (×14): 4 g via TOPICAL
  Filled 2020-10-08: qty 100

## 2020-10-08 MED ORDER — IOHEXOL 350 MG/ML SOLN
75.0000 mL | Freq: Once | INTRAVENOUS | Status: AC | PRN
  Administered 2020-10-08: 75 mL via INTRAVENOUS

## 2020-10-08 MED ORDER — PANTOPRAZOLE 80MG IVPB - SIMPLE MED
80.0000 mg | Freq: Once | INTRAVENOUS | Status: AC
  Administered 2020-10-08: 80 mg via INTRAVENOUS
  Filled 2020-10-08: qty 80

## 2020-10-08 MED ORDER — LEVOTHYROXINE SODIUM 100 MCG PO TABS
100.0000 ug | ORAL_TABLET | Freq: Every day | ORAL | Status: DC
  Administered 2020-10-09 – 2020-10-13 (×5): 100 ug via ORAL
  Filled 2020-10-08 (×4): qty 1
  Filled 2020-10-08: qty 2

## 2020-10-08 MED ORDER — MORPHINE SULFATE (PF) 2 MG/ML IV SOLN
1.0000 mg | Freq: Three times a day (TID) | INTRAVENOUS | Status: DC | PRN
  Administered 2020-10-10 – 2020-10-11 (×2): 1 mg via INTRAVENOUS
  Filled 2020-10-08 (×2): qty 1

## 2020-10-08 NOTE — H&P (Signed)
History and Physical    Krystal Goodwin [id number] DOB: 16-Oct-1933 DOA: 10/08/2020  PCP: Derinda Late, MD  Patient coming from:  [facility]  I have personally briefly reviewed patient's old medical records in [place]  Chief Complaint: low hgb  HPI: Krystal Goodwin is a 87 y.o. female with medical history significant of  Diet controlled DMII, Aortic valve stenosis s/p aortic valve repair, mild dementia, asthma, colon ca s/p remote partial colectomy with last cscope in system noting no abnormality, IDA, Afib on Eliquis, HLD,Gout,HTN, Hypothyroidism, OSA noncompliant with cpap over the last 3 years who presents from NH due to low hgb. Per daughter who gives history patient one week ago was noted to be pale and not her usually self and as time when on she began to become more weak and noted to have lower energy that her baseline.  Per daughter mother had complaint of dysuria during this time but this resolved, otherwise patient had no particular complaints. Per daughter patient is total care and was not noted by staff at [facility] to have black stools or blood in her stools. Patient currently complaining of left knee pain which daughter states is chronic for which patient states ultram and vicodin routinely. In ed on evaluation patient was noted to have hgb of 4.7 with black heme + stools. GI Dr Durwin Reges contacted by ED. Patient was placed on protonix drip and transfused 2PRBC and slated to admission. ED Course:  Vitals: afeb, bp 110/34 ,hr 70 rr16 NA:136, K 4.7,Co2 18, cr 1.60 (1.21, Cal :8.8 , Albumin3.3  Wbc:7.1, hgb 4.7, plt203  CTA of abdomen IMPRESSION: 1. Marked severity atherosclerotic disease without evidence of aneurysm or dissection. 2. Large hiatal hernia. 3. Hepatic steatosis. 4. Status post cholecystectomy. 5. Total left hip replacement.   Aortic Atherosclerosis (ICD10-I70.0).   Review of Systems: As per HPI otherwise 10 point review of systems negative.   Past Medical History:   Diagnosis Date   Anemia    iron def.anemia   Aortic stenosis, severe    Asthma    Chest pain    Colon cancer (HCC)    Complication of anesthesia    nausea   Diabetes mellitus without complication (HCC)    Dyslipidemia    Dyspnea    Gout, joint    H/O postmenopausal osteoporosis    Hematuria    History of palpitations    Hx of colonic polyps    Hypertension    Hypothyroidism    IBS (irritable bowel syndrome)    Schatzki's ring    Sleep apnea     Past Surgical History:  Procedure Laterality Date   AORTIC VALVE REPLACEMENT     CARDIAC PACEMAKER PLACEMENT     CHOLECYSTECTOMY     COLON SURGERY  1997   RIGHT HEMICOLECTOMY   COLONOSCOPY     COLONOSCOPY WITH PROPOFOL N/A 09/07/2016   Procedure: COLONOSCOPY WITH PROPOFOL;  Surgeon: Manya Silvas, MD;  Location[facility] ENDOSCOPY;  Service: Endoscopy;  Laterality: N/A;   ESOPHAGOGASTRODUODENOSCOPY     HIP ARTHROPLASTY Left 09/05/2019   Procedure: ARTHROPLASTY BIPOLAR HIP (HEMIARTHROPLASTY);  Surgeon: Corky Mull, MD;  Location: [facility] ORS;  Service: Orthopedics;  Laterality: Left;   TEE WITHOUT CARDIOVERSION  03/08/2013     reports that she quit smoking about 31 years ago. Her smoking use included cigarettes. She has a 20.00 pack-year smoking history. She has never used smokeless tobacco. She reports that she does not drink alcohol and does not use drugs.  Allergies  Allergen Reactions   Amoxicillin Itching and Rash    Breaks out in a rash On hands On hands    Erythromycin Nausea And Vomiting and Other (See Comments)   Vicodin [Hydrocodone-Acetaminophen] Nausea And Vomiting    Family History  Problem Relation Age of Onset   Breast cancer Neg Hx     Prior to Admission medications   Medication Sig Start Date End Date Taking? Authorizing Provider  acetaminophen (TYLENOL) 650 MG CR tablet Take 650 mg by mouth 3 (three) times daily.   Yes [provider]  atorvastatin (LIPITOR) 10 MG tablet Take 10 mg by mouth  daily. 06/20/19  Yes [provider]  cetirizine (ZYRTEC) 10 MG tablet Take 10 mg by mouth daily.   Yes [provider]  Cholecalciferol (VITAMIN D3) 50 MCG (2000 UT) TABS Take 2,000 Units by mouth daily at 12 noon.   Yes [provider]  citalopram (CELEXA) 20 MG tablet Take 20 mg by mouth daily. 08/21/19  Yes [provider]  diclofenac Sodium (VOLTAREN) 1 % GEL Apply 4 g topically 4 (four) times daily.   Yes [provider]  ELIQUIS 5 MG TABS tablet Take 5 mg by mouth 2 (two) times daily. 08/21/19  Yes [provider]  furosemide (LASIX) 20 MG tablet Take 20 mg by mouth daily.   Yes [provider]  HYDROcodone-acetaminophen (NORCO) 7.5-325 MG tablet Take 1 tablet by mouth 3 (three) times daily.   Yes [provider]  levothyroxine (SYNTHROID) 100 MCG tablet Take 100 mcg by mouth daily before breakfast.    Yes [provider]  melatonin 5 MG TABS Take 5 mg by mouth at bedtime.   Yes [provider]  metoprolol succinate (TOPROL-XL) 25 MG 24 hr tablet Take 25 mg by mouth daily. 06/20/19  Yes [provider]  Multiple Vitamin (MULTIVITAMIN) tablet Take 1 tablet by mouth daily.   Yes [provider]  senna-docusate (SENOKOT-S) 8.6-50 MG tablet Take 2 tablets by mouth at bedtime.   Yes [provider]  ALPRAZolam (XANAX) 0.25 MG tablet Take 1-2 tablets (0.25-0.5 mg total) by mouth every 4 (four) hours as needed for anxiety or sleep. Patient not taking: No sig reported 09/09/19   Fritzi Mandes, MD  ferrous sulfate 325 (65 FE) MG tablet Take 325 mg by mouth daily with breakfast. Patient not taking: No sig reported    [provider]  HYDROcodone-acetaminophen (NORCO/VICODIN) 5-325 MG tablet Take 1 tablet by mouth every 4 (four) hours as needed for moderate pain. Patient not taking: No sig reported 09/06/19   Lattie Corns, PA-C  losartan (COZAAR) 25 MG tablet Take 1 tablet (25 mg  total) by mouth daily. Patient not taking: No sig reported 09/09/19   Fritzi Mandes, MD  Melatonin-Pyridoxine 1-10 MG TABS Take by mouth at bedtime as needed. Patient not taking: No sig reported    [provider]  pantoprazole (PROTONIX) 40 MG tablet Take 40 mg by mouth daily. Patient not taking: No sig reported    [provider]  traMADol (ULTRAM) 50 MG tablet Take 1 tablet (50 mg total) by mouth every 6 (six) hours as needed for moderate pain. Patient not taking: No sig reported 09/06/19   Lattie Corns, PA-C    Physical Exam: Vitals:   10/08/20 1752 10/08/20 1929 10/08/20 1945 10/08/20 2015  BP:  (!) 114/32 (!) 109/40 (!) 98/38  Pulse:  70 70 72  Resp:  17 18 14  Temp:  97.7 F (36.5 C) 98.4 F (36.9 C)   TempSrc:  Oral Oral   SpO2:   100% 100%  Weight: 88.5 kg     Height: '5\' 5"'$  (1.651 m)        Vitals:   10/08/20 1752 10/08/20 1929 10/08/20 1945 10/08/20 2015  BP:  (!) 114/32 (!) 109/40 (!) 98/38  Pulse:  70 70 72  Resp:  '17 18 14  '$ Temp:  97.7 F (36.5 C) 98.4 F (36.9 C)   TempSrc:  Oral Oral   SpO2:   100% 100%  Weight: 88.5 kg     Height: '5\' 5"'$  (1.651 m)     Constitutional: NAD, calm, comfortable Eyes: PERRL, lids and conjunctivae pale ENMT: Mucous membranes are moist. Posterior pharynx clear of any exudate or lesions. Teeth intact  Neck: normal, supple, no masses, no thyromegaly Respiratory: clear to auscultation bilaterally, no wheezing, no crackles. Normal respiratory effort. No accessory muscle use.  Cardiovascular: Regular rate and rhythm, no murmurs / rubs / gallops. +extremity edema. Extremity warm Abdomen: mild llq tenderness, no masses palpated. No hepatosplenomegaly. Bowel sounds positive.  Musculoskeletal: no clubbing / cyanosis. Pain with rom of left knee  Skin: venostasis changes b/l lower extremities  no  ulcers. No induration Neurologic: CN 2-12 grossly intact. Sensation intact,  Strength 5/5 in all 4.  Psychiatric: Normal  judgment and insight. Alert and oriented x 3. Normal mood.    Labs on Admission: I have personally reviewed following labs and imaging studies  CBC: Recent Labs  Lab 10/08/20 1757  WBC 7.1  HGB 4.7*  HCT 16.9*  MCV 78.2*  PLT [id number]   Basic Metabolic Panel: Recent Labs  Lab 10/08/20 1757  NA 136  K 4.7  CL 110  CO2 18*  GLUCOSE 94  BUN 37*  CREATININE 1.60*  CALCIUM 8.8*   GFR: Estimated Creatinine Clearance: 28.2 mL/min (A) (by C-G formula based on SCr of 1.6 mg/dL (H)). Liver Function Tests: Recent Labs  Lab 10/08/20 1757  AST 28  ALT 13  ALKPHOS 65  BILITOT 0.6  PROT 6.3*  ALBUMIN 3.3*   No results for input(s): LIPASE, AMYLASE in the last 168 hours. No results for input(s): AMMONIA in the last 168 hours. Coagulation Profile: No results for input(s): INR, PROTIME in the last 168 hours. Cardiac Enzymes: No results for input(s): CKTOTAL, CKMB, CKMBINDEX, TROPONINI in the last 168 hours. BNP (last 3 results) No results for input(s): PROBNP in the last 8760 hours. HbA1C: No results for input(s): HGBA1C in the last 72 hours. CBG: No results for input(s): GLUCAP in the last 168 hours. Lipid Profile: No results for input(s): CHOL, HDL, LDLCALC, TRIG, CHOLHDL, LDLDIRECT in the last 72 hours. Thyroid Function Tests: No results for input(s): TSH, T4TOTAL, FREET4, T3FREE, THYROIDAB in the last 72 hours. Anemia Panel: No results for input(s): VITAMINB12, FOLATE, FERRITIN, TIBC, IRON, RETICCTPCT in the last 72 hours. Urine analysis:    Component Value Date/Time   COLORURINE YELLOW (A) 03/28/2020 1728   APPEARANCEUR CLEAR (A) 03/28/2020 1728   APPEARANCEUR Hazy 09/29/2013 1552   LABSPEC 1.012 03/28/2020 1728   LABSPEC 1.016 09/29/2013 1552   PHURINE 5.0 03/28/2020 1728   GLUCOSEU NEGATIVE 03/28/2020 1728   GLUCOSEU Negative 09/29/2013 1552   HGBUR NEGATIVE 03/28/2020 1728   BILIRUBINUR NEGATIVE 03/28/2020 1728   BILIRUBINUR Negative 09/29/2013 1552    KETONESUR NEGATIVE 03/28/2020 1728   PROTEINUR NEGATIVE 03/28/2020 1728   NITRITE NEGATIVE 03/28/2020 1728   LEUKOCYTESUR  SMALL (A) 03/28/2020 1728   LEUKOCYTESUR Negative 09/29/2013 1552    Radiological Exams on Admission: CT Angio Abd/Pel W and/or Wo Contrast  Result Date: 10/08/2020 CLINICAL DATA:  Abnormal labs with low hemoglobin level. EXAM: CTA ABDOMEN AND PELVIS WITHOUT AND WITH CONTRAST TECHNIQUE: Multidetector CT imaging of the abdomen and pelvis was performed using the standard protocol during bolus administration of intravenous contrast. Multiplanar reconstructed images and MIPs were obtained and reviewed to evaluate the vascular anatomy. CONTRAST:  57m OMNIPAQUE IOHEXOL 350 MG/ML SOLN COMPARISON:  August 02, 2013 FINDINGS: VASCULAR Aorta: Marked severity calcification and atherosclerosis without aneurysm, dissection, vasculitis or significant stenosis. Celiac: Marked severity calcification and atherosclerosis without evidence of aneurysm, dissection, vasculitis or significant stenosis. SMA: Marked severity calcification and atherosclerosis without evidence of aneurysm, dissection, vasculitis or significant stenosis. Renals: Marked severity calcification and atherosclerosis without evidence of aneurysm, dissection, vasculitis, fibromuscular dysplasia or significant stenosis. IMA: Patent without evidence of aneurysm, dissection, vasculitis or significant stenosis. Inflow: Marked severity calcification and atherosclerosis without evidence of aneurysm, dissection, vasculitis or significant stenosis. Proximal Outflow: Bilateral common femoral and visualized portions of the superficial and profunda femoral arteries are patent without evidence of aneurysm, dissection, vasculitis or significant stenosis. Veins: No obvious venous abnormality within the limitations of this arterial phase study. Review of the MIP images confirms the above findings. NON-VASCULAR Lower chest: No acute abnormality.  Hepatobiliary: There is diffuse fatty infiltration of the liver parenchyma. No focal liver abnormality is seen. Status post cholecystectomy. No biliary dilatation. Pancreas: Unremarkable. No pancreatic ductal dilatation or surrounding inflammatory changes. Spleen: Normal in size without focal abnormality. Adrenals/Urinary Tract: Adrenal glands are unremarkable. Kidneys are normal, without renal calculi, focal lesion, or hydronephrosis. Bladder is unremarkable. Stomach/Bowel: There is a large hiatal hernia. Postoperative changes seen consistent with prior right hemicolectomy. No evidence of bowel wall thickening, distention, or inflammatory changes. Lymphatic: No abnormal abdominal or pelvic lymph nodes are identified. Reproductive: Uterus and bilateral adnexa are unremarkable. Other: No abdominal wall hernia or abnormality. No abdominopelvic ascites. Musculoskeletal: A total left hip replacement is seen with associated streak artifact and subsequently limited evaluation of the adjacent osseous and soft tissue structures. Degenerative changes are noted throughout the lumbar spine. IMPRESSION: 1. Marked severity atherosclerotic disease without evidence of aneurysm or dissection. 2. Large hiatal hernia. 3. Hepatic steatosis. 4. Status post cholecystectomy. 5. Total left hip replacement. Aortic Atherosclerosis (ICD10-I70.0). Electronically Signed   By: TVirgina NorfolkM.D.   On: 10/08/2020 19:41    EKG: Independently reviewed.pending  Assessment/Plan  Acute Gi bleed with anemia of blood loss -presumed upper gi bleed  -risk factor eliquis use  -admit to SDU/tele -hold all anticoagulants  -s/p 2 prbc in ed, transfuse for hgbh<7 -protonix drip de-escalation per gi  -serial h/h -f/u gi recs    Afib  -order ekg  -currently rate controlled   Aortic stenosis  -s/p valve repair  -no active issues noted  -follow with cardiology out patient   Asthma -prn nebs /not on controller med  Mild dementia   -monitor for sundowning /hospital delirium  Colon CA  s/p partial colectomy -last c-scope 3 years ago stable -no plan on repeat due to age   DJD knee/Gout -chronic left knee pain  -active issues  -prn pain medications , transition back to oral regimen as able   DMII -diet controlled per daughter -check a1c  -place on  fs  HLD -resume statin once tolerating po   Hypothyroidism -resume synthroid  OSA  -qhs O2  continuous pulse ox  -noncompliant with cpap over the last 3 years   Debility -s/p hip fx 3 years ago, now wheel chair bound   Chronic lower extremity swelling  -on low dose lasix  -currently exam notes tense edema  -check bnp /echo  -holding lasix du eto gi bleed low bp  -monitor closely for fluid overload  -d-dimer for completeness sake    IDA -on iron as out pt  -check iron panel ,eval need for iron infusion   DVT prophylaxis: scd  Code Status: full Family Communication: daughter at bedside Krystal Goodwin (Daughter)  [phone] (Mobile) Disposition Plan:  patient  expected to be admitted greater than 2 midnights Consults called: Whol GI Admission statusSDU   Clance Boll MD [facility]  If 7PM-7AM, please contact night-coverage [email or web address] Password [facility]  10/08/2020, 9:12 PM

## 2020-10-08 NOTE — ED Provider Notes (Signed)
[facility] Emergency Department Provider Note  ____________________________________________   Event Date/Time   First MD Initiated Contact with Patient 10/08/20 1811     (approximate)  I have reviewed the triage vital signs and the nursing notes.   HISTORY  Chief Complaint Abnormal Lab   HPI Krystal Goodwin is a 87 y.o. female with a past medical history of DM, HDL, aortic valve stenosis status post aortic valve replacement,  mild dementia, asthma, anemia, hypothyroidism, HTN, OSA and paroxysmal on Eliquis, complete heart block with pacemaker, and colon cancer status post partial colectomy several years ago who presents for assessment accompanied by daughter for assessment of increasing fatigue over the last couple weeks.  Per report patient did have some dark stools over the last couple of days.  Her daughter states she thinks her mother has been significantly more fatigued and patient agrees with this although patient is not oriented and further history from the patient is somewhat limited secondary to some dementia.  However she is denying any acute pain, shortness of breath or other clear acute sick symptoms.  She notes he had a colon removed several years ago but that her GI doctor recently retired and she has not had a colonoscopy this year.  She denies any other acute concerns at this time.  Does not take NSAIDs.  She is not sure if he had to Eliquis this morning or not.  Per daughter patient's hemoglobin was 3 earlier today at the nursing facility.      Past Medical History:  Diagnosis Date   Anemia    iron def.anemia   Aortic stenosis, severe    Asthma    Chest pain    Colon cancer (HCC)    Complication of anesthesia    nausea   Diabetes mellitus without complication (HCC)    Dyslipidemia    Dyspnea    Gout, joint    H/O postmenopausal osteoporosis    Hematuria    History of palpitations    Hx of colonic polyps    Hypertension     Hypothyroidism    IBS (irritable bowel syndrome)    Schatzki's ring    Sleep apnea     Patient Active Problem List   Diagnosis Date Noted   Closed fracture of left hip ([place]) 09/05/2019   Diabetes mellitus type II, non insulin dependent ([place]) 09/05/2019   Hypothyroidism    CKD (chronic kidney disease), stage III (HCC)    Hypertension    Sleep apnea    PAF (paroxysmal atrial fibrillation) ([place])    Fall     Past Surgical History:  Procedure Laterality Date   AORTIC VALVE REPLACEMENT     CARDIAC PACEMAKER PLACEMENT     CHOLECYSTECTOMY     COLON SURGERY  1997   RIGHT HEMICOLECTOMY   COLONOSCOPY     COLONOSCOPY WITH PROPOFOL N/A 09/07/2016   Procedure: COLONOSCOPY WITH PROPOFOL;  Surgeon: Manya Silvas, MD;  Location: [facility] ENDOSCOPY;  Service: Endoscopy;  Laterality: N/A;   ESOPHAGOGASTRODUODENOSCOPY     HIP ARTHROPLASTY Left 09/05/2019   Procedure: ARTHROPLASTY BIPOLAR HIP (HEMIARTHROPLASTY);  Surgeon: Corky Mull, MD;  Location: [facility] ORS;  Service: Orthopedics;  Laterality: Left;   TEE WITHOUT CARDIOVERSION  03/08/2013    Prior to Admission medications   Medication Sig Start Date End Date Taking? Authorizing Provider  ALPRAZolam (XANAX) 0.25 MG tablet Take 1-2 tablets (0.25-0.5 mg total) by mouth every 4 (four) hours as needed for anxiety or sleep.  09/09/19   Fritzi Mandes, MD  atorvastatin (LIPITOR) 10 MG tablet Take 10 mg by mouth daily. 06/20/19   [provider]  citalopram (CELEXA) 20 MG tablet Take 20 mg by mouth daily. 08/21/19   [provider]  ELIQUIS 5 MG TABS tablet Take 5 mg by mouth 2 (two) times daily. 08/21/19   [provider]  ferrous sulfate 325 (65 FE) MG tablet Take 325 mg by mouth daily with breakfast.    [provider]  furosemide (LASIX) 40 MG tablet Take 40 mg by mouth.    [provider]  HYDROcodone-acetaminophen (NORCO/VICODIN) 5-325 MG tablet Take 1 tablet by mouth every 4 (four) hours as needed for moderate  pain. 09/06/19   Lattie Corns, PA-C  levothyroxine (SYNTHROID) 100 MCG tablet Take 100 mcg by mouth daily before breakfast.     [provider]  losartan (COZAAR) 25 MG tablet Take 1 tablet (25 mg total) by mouth daily. 09/09/19   Fritzi Mandes, MD  Melatonin-Pyridoxine 1-10 MG TABS Take by mouth at bedtime as needed.    [provider]  metoprolol succinate (TOPROL-XL) 25 MG 24 hr tablet Take 25 mg by mouth daily. 06/20/19   [provider]  Multiple Vitamin (MULTIVITAMIN) tablet Take 1 tablet by mouth daily.    [provider]  pantoprazole (PROTONIX) 40 MG tablet Take 40 mg by mouth daily.    [provider]  traMADol (ULTRAM) 50 MG tablet Take 1 tablet (50 mg total) by mouth every 6 (six) hours as needed for moderate pain. 09/06/19   Lattie Corns, PA-C    Allergies Amoxicillin, Erythromycin, and Vicodin [hydrocodone-acetaminophen]  Family History  Problem Relation Age of Onset   Breast cancer Neg Hx     Social History Social History   Tobacco Use   Smoking status: Former    Packs/day: 1.00    Years: 20.00    Pack years: 20.00    Types: Cigarettes    Quit date: 02/21/1989    Years since quitting: 31.6   Smokeless tobacco: Never  Vaping Use   Vaping Use: Never used  Substance Use Topics   Alcohol use: No   Drug use: No    Review of Systems  Review of Systems  Unable to perform ROS: Dementia  Constitutional:  Positive for malaise/fatigue.  Gastrointestinal:  Positive for constipation (per pt no BM in 4 days although daughter is not sure about this).     ____________________________________________   PHYSICAL EXAM:  VITAL SIGNS: ED Triage Vitals  Enc Vitals Group     BP 10/08/20 1751 (!) 110/34     Pulse Rate 10/08/20 1751 70     Resp 10/08/20 1751 16     Temp 10/08/20 1751 98.9 F (37.2 C)     Temp Source 10/08/20 1751 Oral     SpO2 10/08/20 1751 94 %     Weight 10/08/20 1752 195 lb 1.7 oz (88.5 kg)      Height 10/08/20 1752 '5\' 5"'$  (1.651 m)     Head Circumference --      Peak Flow --      Pain Score 10/08/20 1751 0     Pain Loc --      Pain Edu? --      Excl. in [place]? --    Vitals:   10/08/20 1929 10/08/20 1945  BP: (!) 114/32 (!) 109/40  Pulse: 70 70  Resp: 17 18  Temp: 97.7 F (36.5 C) 98.4  F (36.9 C)  SpO2:     Physical Exam Vitals and nursing note reviewed.  Constitutional:      General: She is not in acute distress.    Appearance: She is well-developed.  HENT:     Head: Normocephalic and atraumatic.     Right Ear: External ear normal.     Left Ear: External ear normal.     Nose: Nose normal.  Eyes:     Conjunctiva/sclera: Conjunctivae normal.  Cardiovascular:     Rate and Rhythm: Normal rate and regular rhythm.     Heart sounds: No murmur heard. Pulmonary:     Effort: Pulmonary effort is normal. No respiratory distress.     Breath sounds: Normal breath sounds.  Abdominal:     Palpations: Abdomen is soft.     Tenderness: There is no abdominal tenderness.  Genitourinary:    Rectum: Guaiac result positive.     Comments: No active bleeding visualized.  Dark stool is Hemoccult positive. Musculoskeletal:     Cervical back: Neck supple.  Skin:    General: Skin is warm and dry.     Capillary Refill: Capillary refill takes 2 to 3 seconds.  Neurological:     Mental Status: She is alert. Mental status is at baseline. She is disoriented and confused.     ____________________________________________   LABS (all labs ordered are listed, but only abnormal results are displayed)  Labs Reviewed  COMPREHENSIVE METABOLIC PANEL - Abnormal; Notable for the following components:      Result Value   CO2 18 (*)    BUN 37 (*)    Creatinine, Ser 1.60 (*)    Calcium 8.8 (*)    Total Protein 6.3 (*)    Albumin 3.3 (*)    GFR, Estimated 31 (*)    All other components within normal limits  CBC - Abnormal; Notable for the following components:   RBC 2.16 (*)    Hemoglobin  4.7 (*)    HCT 16.9 (*)    MCV 78.2 (*)    MCH 21.8 (*)    MCHC 27.8 (*)    RDW 17.1 (*)    nRBC 0.6 (*)    All other components within normal limits  RESP PANEL BY RT-PCR (FLU A&B, COVID) ARPGX2  PROTIME-INR  TYPE AND SCREEN  PREPARE RBC (CROSSMATCH)   ____________________________________________  EKG  ____________________________________________  RADIOLOGY  ED MD interpretation: CTA abdomen pelvis shows fairly severe atherosclerotic disease without evidence of dissection or aneurysm or active bleeding.  There is a large hiatal hernia and some hepatic steatosis and patient is known to be postcholecystectomy and status post total left hip replacement.  No evidence of diverticulitis or other acute abdominopelvic process.   Official radiology report(s): CT Angio Abd/Pel W and/or Wo Contrast  Result Date: 10/08/2020 CLINICAL DATA:  Abnormal labs with low hemoglobin level. EXAM: CTA ABDOMEN AND PELVIS WITHOUT AND WITH CONTRAST TECHNIQUE: Multidetector CT imaging of the abdomen and pelvis was performed using the standard protocol during bolus administration of intravenous contrast. Multiplanar reconstructed images and MIPs were obtained and reviewed to evaluate the vascular anatomy. CONTRAST:  72m OMNIPAQUE IOHEXOL 350 MG/ML SOLN COMPARISON:  August 02, 2013 FINDINGS: VASCULAR Aorta: Marked severity calcification and atherosclerosis without aneurysm, dissection, vasculitis or significant stenosis. Celiac: Marked severity calcification and atherosclerosis without evidence of aneurysm, dissection, vasculitis or significant stenosis. SMA: Marked severity calcification and atherosclerosis without evidence of aneurysm, dissection, vasculitis or significant stenosis. Renals: Marked severity calcification and atherosclerosis  without evidence of aneurysm, dissection, vasculitis, fibromuscular dysplasia or significant stenosis. IMA: Patent without evidence of aneurysm, dissection, vasculitis or significant  stenosis. Inflow: Marked severity calcification and atherosclerosis without evidence of aneurysm, dissection, vasculitis or significant stenosis. Proximal Outflow: Bilateral common femoral and visualized portions of the superficial and profunda femoral arteries are patent without evidence of aneurysm, dissection, vasculitis or significant stenosis. Veins: No obvious venous abnormality within the limitations of this arterial phase study. Review of the MIP images confirms the above findings. NON-VASCULAR Lower chest: No acute abnormality. Hepatobiliary: There is diffuse fatty infiltration of the liver parenchyma. No focal liver abnormality is seen. Status post cholecystectomy. No biliary dilatation. Pancreas: Unremarkable. No pancreatic ductal dilatation or surrounding inflammatory changes. Spleen: Normal in size without focal abnormality. Adrenals/Urinary Tract: Adrenal glands are unremarkable. Kidneys are normal, without renal calculi, focal lesion, or hydronephrosis. Bladder is unremarkable. Stomach/Bowel: There is a large hiatal hernia. Postoperative changes seen consistent with prior right hemicolectomy. No evidence of bowel wall thickening, distention, or inflammatory changes. Lymphatic: No abnormal abdominal or pelvic lymph nodes are identified. Reproductive: Uterus and bilateral adnexa are unremarkable. Other: No abdominal wall hernia or abnormality. No abdominopelvic ascites. Musculoskeletal: A total left hip replacement is seen with associated streak artifact and subsequently limited evaluation of the adjacent osseous and soft tissue structures. Degenerative changes are noted throughout the lumbar spine. IMPRESSION: 1. Marked severity atherosclerotic disease without evidence of aneurysm or dissection. 2. Large hiatal hernia. 3. Hepatic steatosis. 4. Status post cholecystectomy. 5. Total left hip replacement. Aortic Atherosclerosis (ICD10-I70.0). Electronically Signed   By: Virgina Norfolk M.D.   On:  10/08/2020 19:41    ____________________________________________   PROCEDURES  Procedure(s) performed (including Critical Care):  .Critical Care  Date/Time: 10/08/2020 7:42 PM Performed by: Lucrezia Starch, MD Authorized by: Lucrezia Starch, MD   Critical care provider statement:    Critical care time (minutes):  45   Critical care was necessary to treat or prevent imminent or life-threatening deterioration of the following conditions:  Circulatory failure   Critical care was time spent personally by me on the following activities:  Discussions with consultants, evaluation of patient's response to treatment, examination of patient, ordering and performing treatments and interventions, ordering and review of laboratory studies, ordering and review of radiographic studies, pulse oximetry, re-evaluation of patient's condition, obtaining history from patient or surrogate and review of old charts   ____________________________________________   INITIAL IMPRESSION / [place] / ED COURSE        Presents with above to history exam for assessment of increasing fatigue over the last couple of days and hemoglobin of 3 per daughter and nursing facility earlier today.  She is demented history is somewhat limited from her visit baseline per daughter.  She denies any acute pain and per report has had some dark stools last couple days.  She is anticoagulated Eliquis.  On arrival she is borderline hypotensive with a BP of 110/30/stable vital signs on room air.  She is pale and looks little dehydrated.  She has a nonfocal neuro exam.  She has some dark stools Hemoccult positive on exam but no active bleeding.  Her abdomen is soft.  Differential includes likely GI bleed with other source of hemorrhage less likely at this time given there is no history of recent trauma or injuries or recent iron anemia.  Patient has a history of peptic ulcer disease and is not on NSAIDs and I would lower  suspicion for upper GI  bleed at this time.  Concern for possible malignancy versus AVM versus bleeding diverticuli versus other cause.  CBC remarkable for no leukocytosis but hemoglobin of 4.7 compared to 10.66 months ago.  Platelets are 203.  CMP remarkable for bicarb of 18, creatinine of 1.6 compared to 1.26 months ago without other significant electrolyte or metabolic derangements.  Given concern for symptomatic anemia with hemoglobin below 7 patient was typed and screened and 3 units of PRBCs were ordered.  We will also obtain a CTA to assess for any active bleeding or diverticuli.   CTA abdomen pelvis shows fairly severe atherosclerotic disease without evidence of dissection or aneurysm or active bleeding.  There is a large hiatal hernia and some hepatic steatosis and patient is known to be postcholecystectomy and status post total left hip replacement.  No evidence of diverticulitis or other acute abdominopelvic process.   Discussed with on-call gastroenterologist Dr. Verl Blalock recommend maintaining large-bore IV access transfusing hemoglobin above 7 and starting on PPI drip with plan to see the patient tomorrow.  I will admit to hospitalist service for further evaluation and management.       ____________________________________________   FINAL CLINICAL IMPRESSION(S) / ED DIAGNOSES  Final diagnoses:  Gastrointestinal hemorrhage, unspecified gastrointestinal hemorrhage type  Anemia, unspecified type    Medications  pantoprazole (PROTONIX) 80 mg /NS 100 mL IVPB (80 mg Intravenous New Bag/Given 10/08/20 1944)  pantoprozole (PROTONIX) 80 mg /NS 100 mL infusion (has no administration in time range)  pantoprazole (PROTONIX) injection 40 mg (has no administration in time range)  0.9 %  sodium chloride infusion (10 mL/hr Intravenous New Bag/Given 10/08/20 1944)  iohexol (OMNIPAQUE) 350 MG/ML injection 75 mL (75 mLs Intravenous Contrast Given 10/08/20 1847)     ED Discharge Orders     None         Note:  This document was prepared using Dragon voice recognition software and may include unintentional dictation errors.    Lucrezia Starch, MD 10/08/20 [place]

## 2020-10-08 NOTE — ED Notes (Signed)
Pt lying in bed, trying to sleep. Pt receiving blood at this time with stable vitals. Pt in no obvious distress.

## 2020-10-08 NOTE — ED Triage Notes (Signed)
Pt comes into the ED via [facility] from [facility] c/o abnormal labs.  Pt had Hgb of 4.2.  Pt only c/o fatigue at this time. Staff did not report black stools, but patient presents pale in color.   118/88 70Hr H/o dementia.

## 2020-10-09 ENCOUNTER — Inpatient Hospital Stay
Admit: 2020-10-09 | Discharge: 2020-10-09 | Disposition: A | Discharge status: Home / Self Care | Payer: Medicare PPO | Attending: Internal Medicine | Admitting: Internal Medicine

## 2020-10-09 ENCOUNTER — Inpatient Hospital Stay: Payer: Medicare PPO

## 2020-10-09 DIAGNOSIS — I480 Paroxysmal atrial fibrillation: Secondary | ICD-10-CM

## 2020-10-09 DIAGNOSIS — I5032 Chronic diastolic (congestive) heart failure: Secondary | ICD-10-CM

## 2020-10-09 DIAGNOSIS — K921 Melena: Secondary | ICD-10-CM

## 2020-10-09 DIAGNOSIS — F0150 Vascular dementia without behavioral disturbance: Secondary | ICD-10-CM

## 2020-10-09 DIAGNOSIS — N179 Acute kidney failure, unspecified: Secondary | ICD-10-CM

## 2020-10-09 DIAGNOSIS — D509 Iron deficiency anemia, unspecified: Secondary | ICD-10-CM | POA: Diagnosis not present

## 2020-10-09 LAB — COMPREHENSIVE METABOLIC PANEL
ALT: 13 U/L (ref 0–44)
AST: 19 U/L (ref 15–41)
Albumin: 2.9 g/dL — ABNORMAL LOW (ref 3.5–5.0)
Alkaline Phosphatase: 57 U/L (ref 38–126)
Anion gap: 6 (ref 5–15)
BUN: 34 mg/dL — ABNORMAL HIGH (ref 8–23)
CO2: 22 mmol/L (ref 22–32)
Calcium: 8.6 mg/dL — ABNORMAL LOW (ref 8.9–10.3)
Chloride: 110 mmol/L (ref 98–111)
Creatinine, Ser: 1.5 mg/dL — ABNORMAL HIGH (ref 0.44–1.00)
GFR, Estimated: 34 mL/min — ABNORMAL LOW (ref >60)
Glucose, Bld: 95 mg/dL (ref 70–99)
Potassium: 4 mmol/L (ref 3.5–5.1)
Sodium: 138 mmol/L (ref 135–145)
Total Bilirubin: 1.1 mg/dL (ref 0.3–1.2)
Total Protein: 6 g/dL — ABNORMAL LOW (ref 6.5–8.1)

## 2020-10-09 LAB — URINALYSIS, COMPLETE (UACMP) WITH MICROSCOPIC
Bilirubin Urine: NEGATIVE
Glucose, UA: NEGATIVE mg/dL
Hgb urine dipstick: NEGATIVE
Ketones, ur: NEGATIVE mg/dL
Nitrite: NEGATIVE
Protein, ur: NEGATIVE mg/dL
Specific Gravity, Urine: 1.038 — ABNORMAL HIGH (ref 1.005–1.030)
pH: 5 (ref 5.0–8.0)

## 2020-10-09 LAB — ECHOCARDIOGRAM COMPLETE
AR max vel: 1.47 cm2
AV Area VTI: 1.64 cm2
AV Area mean vel: 1.44 cm2
AV Mean grad: 16 mmHg
AV Peak grad: 28.3 mmHg
Ao pk vel: 2.66 m/s
Area-P 1/2: 4.46 cm2
Height: 65 in
MV VTI: 2.16 cm2
S' Lateral: 3.08 cm
Weight: 3121.71 oz

## 2020-10-09 LAB — C-REACTIVE PROTEIN: CRP: 0.7 mg/dL (ref <1.0)

## 2020-10-09 LAB — BLOOD GAS, ARTERIAL
Acid-base deficit: 5.6 mmol/L — ABNORMAL HIGH (ref 0.0–2.0)
Bicarbonate: 18.4 mmol/L — ABNORMAL LOW (ref 20.0–28.0)
FIO2: 32
O2 Saturation: 97.9 %
Patient temperature: 37
pCO2 arterial: 29 mmHg — ABNORMAL LOW (ref 32.0–48.0)
pH, Arterial: 7.41 (ref 7.350–7.450)
pO2, Arterial: 101 mmHg (ref 83.0–108.0)

## 2020-10-09 LAB — FOLATE: Folate: 42 ng/mL (ref >5.9)

## 2020-10-09 LAB — PROTIME-INR
INR: 1.8 — ABNORMAL HIGH (ref 0.8–1.2)
Prothrombin Time: 21 seconds — ABNORMAL HIGH (ref 11.4–15.2)

## 2020-10-09 LAB — TSH: TSH: 0.615 u[IU]/mL (ref 0.350–4.500)

## 2020-10-09 LAB — BRAIN NATRIURETIC PEPTIDE: B Natriuretic Peptide: 675.2 pg/mL — ABNORMAL HIGH (ref 0.0–100.0)

## 2020-10-09 LAB — IRON AND TIBC
Iron: 12 ug/dL — ABNORMAL LOW (ref 28–170)
Saturation Ratios: 3 % — ABNORMAL LOW (ref 10.4–31.8)
TIBC: 463 ug/dL — ABNORMAL HIGH (ref 250–450)
UIBC: 451 ug/dL

## 2020-10-09 LAB — D-DIMER, QUANTITATIVE: D-Dimer, Quant: 0.63 ug/mL-FEU — ABNORMAL HIGH (ref 0.00–0.50)

## 2020-10-09 LAB — HEMOGLOBIN A1C
Hgb A1c MFr Bld: 5.5 % (ref 4.8–5.6)
Mean Plasma Glucose: 111.15 mg/dL

## 2020-10-09 LAB — CBG MONITORING, ED
Glucose-Capillary: 102 mg/dL — ABNORMAL HIGH (ref 70–99)
Glucose-Capillary: 95 mg/dL (ref 70–99)
Glucose-Capillary: 94 mg/dL (ref 70–99)

## 2020-10-09 LAB — HEMOGLOBIN AND HEMATOCRIT, BLOOD
HCT: 23.6 % — ABNORMAL LOW (ref 36.0–46.0)
HCT: 23.7 % — ABNORMAL LOW (ref 36.0–46.0)
HCT: 23.8 % — ABNORMAL LOW (ref 36.0–46.0)
HCT: 25.1 % — ABNORMAL LOW (ref 36.0–46.0)
Hemoglobin: 7.3 g/dL — ABNORMAL LOW (ref 12.0–15.0)
Hemoglobin: 7.4 g/dL — ABNORMAL LOW (ref 12.0–15.0)
Hemoglobin: 7.5 g/dL — ABNORMAL LOW (ref 12.0–15.0)
Hemoglobin: 7.6 g/dL — ABNORMAL LOW (ref 12.0–15.0)

## 2020-10-09 LAB — VITAMIN B12: Vitamin B-12: 392 pg/mL (ref 180–914)

## 2020-10-09 LAB — FERRITIN: Ferritin: 4 ng/mL — ABNORMAL LOW (ref 11–307)

## 2020-10-09 LAB — URIC ACID: Uric Acid, Serum: 8 mg/dL — ABNORMAL HIGH (ref 2.5–7.1)

## 2020-10-09 MED ORDER — LEVALBUTEROL HCL 0.63 MG/3ML IN NEBU
0.6300 mg | INHALATION_SOLUTION | Freq: Three times a day (TID) | RESPIRATORY_TRACT | Status: DC | PRN
  Administered 2020-10-09 – 2020-10-12 (×2): 0.63 mg via RESPIRATORY_TRACT
  Filled 2020-10-09 (×2): qty 3

## 2020-10-09 MED ORDER — ALPRAZOLAM 0.25 MG PO TABS
0.2500 mg | ORAL_TABLET | Freq: Three times a day (TID) | ORAL | Status: DC | PRN
  Administered 2020-10-09 – 2020-10-13 (×3): 0.25 mg via ORAL
  Filled 2020-10-09 (×3): qty 1

## 2020-10-09 MED ORDER — FUROSEMIDE 10 MG/ML IJ SOLN
60.0000 mg | Freq: Once | INTRAMUSCULAR | Status: AC
  Administered 2020-10-09: 60 mg via INTRAVENOUS
  Filled 2020-10-09: qty 8

## 2020-10-09 MED ORDER — PEG 3350-KCL-NA BICARB-NACL 420 G PO SOLR
4000.0000 mL | Freq: Once | ORAL | Status: DC
  Filled 2020-10-09: qty 4000

## 2020-10-09 MED ORDER — POLYETHYLENE GLYCOL 3350 17 G PO PACK
17.0000 g | PACK | Freq: Every day | ORAL | Status: DC
  Administered 2020-10-12 – 2020-10-13 (×2): 17 g via ORAL
  Filled 2020-10-09 (×4): qty 1

## 2020-10-09 MED ORDER — METOPROLOL TARTRATE 25 MG PO TABS
12.5000 mg | ORAL_TABLET | Freq: Two times a day (BID) | ORAL | Status: DC
  Administered 2020-10-10 – 2020-10-13 (×6): 12.5 mg via ORAL
  Filled 2020-10-09 (×8): qty 1

## 2020-10-09 MED ORDER — SODIUM CHLORIDE 0.9 % IV SOLN
INTRAVENOUS | Status: DC
  Administered 2020-10-11: 20 mL/h via INTRAVENOUS

## 2020-10-09 MED ORDER — METHYLPREDNISOLONE SODIUM SUCC 40 MG IJ SOLR
40.0000 mg | Freq: Once | INTRAMUSCULAR | Status: AC
  Administered 2020-10-09: 40 mg via INTRAVENOUS
  Filled 2020-10-09: qty 1

## 2020-10-09 MED ORDER — SODIUM CHLORIDE 0.9 % IV SOLN
1.0000 g | INTRAVENOUS | Status: DC
  Administered 2020-10-09 – 2020-10-10 (×2): 1 g via INTRAVENOUS
  Filled 2020-10-09: qty 10
  Filled 2020-10-09: qty 1
  Filled 2020-10-09: qty 10

## 2020-10-09 NOTE — ED Notes (Signed)
Pt resting comfortably at this time. Pt appears to be confused on time of the day but oriented to person and place. IV infusing. Pt denies any needs. Purewick in place. Call bell in reach.

## 2020-10-09 NOTE — Progress Notes (Signed)
PROGRESS NOTE    Krystal Goodwin  [id number] DOB: 29-Oct-1933 DOA: 10/08/2020 PCP: Derinda Late, MD    Chief Complaint  Patient presents with   Abnormal Lab    Brief Narrative:  Krystal Goodwin is a 87 y.o. female with medical history significant of dementia Diet controlled DMII, HTN, Aortic valve stenosis s/p aortic valve repair, asthma, colon ca s/p remote partial colectomy with last cscope in system noting no abnormality, IDA, Afib on Eliquis, HLD,Gout,HTN, Hypothyroidism, OSA noncompliant with cpap over the last 3 years who presents from NH due to low hgb  Subjective:  She is hard of hearing, appear has dementia but calm and cooperative Vital signs are stable, no overt bleed currently  Assessment & Plan:   Active Problems:   GI bleed  Blood loss anemia likely from GI loss with report of melena -CT angio abdomen pelvis was done in the ED no acute bleeding identified -Hemoglobin 4.7 on presentation, received 2 units PRBC transfusion, hemoglobin stable above 7 after transfusion -Continue iv PPI -Hold Eliquis -GI consulted plan to do EGD colonoscopy after Eliquis washout of 48 hours, will follow GI recommendation  AKI on CKD 3B Creatinine peaked at's 1.6 Trending down Hold Cozaar Renal dosing medication Repeat BMP in the morning  PAF with h/o heart block s/p PPM Currently regular rate rhythm Hold home meds long-acting metoprolol, changed to Lopressor 12.5 mg twice daily with holding parameters Hold Eliquis due to GI bleed  Chronic diastolic CHF -On oral Lasix 20 mg daily -She does has mild edema lower extremity on exam -She received PRBC transfusion overnight, will give IV Lasix -Monitor volume status   Diet controlled type 2 diabetes A1c 5.5  Hyperlipidemia Continue statin  Hypothyroidism Continue Synthroid  Dementia, failure to thrive Moved from assisted living to skilled nursing facility 102-monthago, has been having intermittent hallucination Per daughter  report in the last few months, daughter is interested in meeting palliative care, palliative care consulted Risk of delirium in the hospital Daughter reported as needed Xanax works for her at home, ordered 0.25 mg prn    Nutritional Assessment: The patient's BMI is: Body mass index is 32.47 kg/m..Marland Kitchen    Unresulted Labs (From admission, onward)     Start     Ordered   10/10/20 [id number] Basic metabolic panel  Tomorrow morning,   STAT        10/09/20 0919   10/10/20 0500  Magnesium  Tomorrow morning,   STAT        10/09/20 0919   10/09/20 1054  Vitamin B12  Add-on,   AD        10/09/20 1053   10/09/20 1054  Folate  Add-on,   AD        10/09/20 1053   10/09/20 0500  C-reactive protein  Once,   STAT        10/09/20 0500   10/09/20 0300  Hemoglobin A1c  Once,   STAT        10/09/20 0300   10/08/20 2307  Hemoglobin and hematocrit, blood  Now then every 6 hours,   STAT      10/08/20 2110              DVT prophylaxis: SCDs Start: 10/08/20 2054   Code Status: Full, confirmed with daughter Family Communication: Daughter at bedside Disposition:   Status is: Inpatient  Dispo: The patient is from: SNF  Anticipated d/c is to: SNF              Anticipated d/c date is: Early next week                Consultants:  GI  Procedures:  Plan for EGD colonoscopy  Antimicrobials:    Anti-infectives (From admission, onward)    Start     Dose/Rate Route Frequency Ordered Stop   10/09/20 1900  cefTRIAXone (ROCEPHIN) 1 g in sodium chloride 0.9 % 100 mL IVPB        1 g 200 mL/hr over 30 Minutes Intravenous Every 24 hours 10/09/20 1813            Objective: Vitals:   10/09/20 0355 10/09/20 0500 10/09/20 0600 10/09/20 0900  BP:  (!) 113/43 (!) 131/54 (!) 134/56  Pulse: 70 69 69 70  Resp: '15 17 16 '$ (!) 21  Temp: (!) 97.1 F (36.2 C)     TempSrc: Oral     SpO2: 98% 99% 99% 96%  Weight:      Height:        Intake/Output Summary (Last 24 hours) at 10/09/2020  1120 Last data filed at 10/09/2020 0053 Gross per 24 hour  Intake 456.5 ml  Output --  Net 456.5 ml   Filed Weights   10/08/20 1752  Weight: 88.5 kg    Examination:  General exam: Frail elderly, baseline dementia Respiratory system: Clear to auscultation. Respiratory effort normal. Cardiovascular system: S1 & S2 heard, RRR. 2/6 precordial murmur, Gastrointestinal system: Abdomen is nondistended, soft and nontender.  Normal bowel sounds heard. Central nervous system: Alert and oriented to self, appear to have dementia, No focal neurological deficits. Extremities: able to lift both leg against gravity, bilateral lower extremity trace pitting edema, left > right Skin: No rashes, lesions or ulcers Psychiatry: dementia.     Data Reviewed: I have personally reviewed following labs and imaging studies  CBC: Recent Labs  Lab 10/08/20 1757 10/09/20 0019 10/09/20 0625 10/09/20 1100  WBC 7.1  --   --   --   HGB 4.7* 7.3* 7.6* 7.4*  HCT 16.9* 23.7* 25.1* 23.6*  MCV 78.2*  --   --   --   PLT 203  --   --   --     Basic Metabolic Panel: Recent Labs  Lab 10/08/20 1757 10/09/20 0625  NA 136 138  K 4.7 4.0  CL 110 110  CO2 18* 22  GLUCOSE 94 95  BUN 37* 34*  CREATININE 1.60* 1.50*  CALCIUM 8.8* 8.6*    GFR: Estimated Creatinine Clearance: 30.1 mL/min (A) (by C-G formula based on SCr of 1.5 mg/dL (H)).  Liver Function Tests: Recent Labs  Lab 10/08/20 1757 10/09/20 0625  AST 28 19  ALT 13 13  ALKPHOS 65 57  BILITOT 0.6 1.1  PROT 6.3* 6.0*  ALBUMIN 3.3* 2.9*    CBG: Recent Labs  Lab 10/09/20 0354 10/09/20 0751  GLUCAP 102* 95     Recent Results (from the past 240 hour(s))  Resp Panel by RT-PCR (Flu A&B, Covid) Nasopharyngeal Swab     Status: None   Collection Time: 10/08/20  8:52 PM   Specimen: Nasopharyngeal Swab; Nasopharyngeal(NP) swabs in vial transport medium  Result Value Ref Range Status   SARS Coronavirus 2 by RT PCR NEGATIVE NEGATIVE Final     Comment: (NOTE) SARS-CoV-2 target nucleic acids are NOT DETECTED.  The SARS-CoV-2 RNA is generally detectable in upper respiratory specimens during the  acute phase of infection. The lowest concentration of SARS-CoV-2 viral copies this assay can detect is 138 copies/mL. A negative result does not preclude SARS-Cov-2 infection and should not be used as the sole basis for treatment or other patient management decisions. A negative result may occur with  improper specimen collection/handling, submission of specimen other than nasopharyngeal swab, presence of viral mutation(s) within the areas targeted by this assay, and inadequate number of viral copies(<138 copies/mL). A negative result must be combined with clinical observations, patient history, and epidemiological information. The expected result is Negative.  Fact Sheet for Patients:  EntrepreneurPulse.com.au  Fact Sheet for Healthcare Providers:  IncredibleEmployment.be  This test is no t yet approved or cleared by the [place] FDA and  has been authorized for detection and/or diagnosis of SARS-CoV-2 by FDA under an Emergency Use Authorization (EUA). This EUA will remain  in effect (meaning this test can be used) for the duration of the COVID-19 declaration under Section 564(b)(1) of the Act, 21 U.S.C.section 360bbb-3(b)(1), unless the authorization is terminated  or revoked sooner.       Influenza A by PCR NEGATIVE NEGATIVE Final   Influenza B by PCR NEGATIVE NEGATIVE Final    Comment: (NOTE) The Xpert Xpress SARS-CoV-2/FLU/RSV plus assay is intended as an aid in the diagnosis of influenza from Nasopharyngeal swab specimens and should not be used as a sole basis for treatment. Nasal washings and aspirates are unacceptable for Xpert Xpress SARS-CoV-2/FLU/RSV testing.  Fact Sheet for Patients: EntrepreneurPulse.com.au  Fact Sheet for Healthcare  Providers: IncredibleEmployment.be  This test is not yet approved or cleared by the [place] FDA and has been authorized for detection and/or diagnosis of SARS-CoV-2 by FDA under an Emergency Use Authorization (EUA). This EUA will remain in effect (meaning this test can be used) for the duration of the COVID-19 declaration under Section 564(b)(1) of the Act, 21 U.S.C. section 360bbb-3(b)(1), unless the authorization is terminated or revoked.  Performed at [facility], [address] [place], [place] [address]          Radiology Studies: CT Angio Abd/Pel W and/or Wo Contrast  Result Date: 10/08/2020 CLINICAL DATA:  Abnormal labs with low hemoglobin level. EXAM: CTA ABDOMEN AND PELVIS WITHOUT AND WITH CONTRAST TECHNIQUE: Multidetector CT imaging of the abdomen and pelvis was performed using the standard protocol during bolus administration of intravenous contrast. Multiplanar reconstructed images and MIPs were obtained and reviewed to evaluate the vascular anatomy. CONTRAST:  35m OMNIPAQUE IOHEXOL 350 MG/ML SOLN COMPARISON:  August 02, 2013 FINDINGS: VASCULAR Aorta: Marked severity calcification and atherosclerosis without aneurysm, dissection, vasculitis or significant stenosis. Celiac: Marked severity calcification and atherosclerosis without evidence of aneurysm, dissection, vasculitis or significant stenosis. SMA: Marked severity calcification and atherosclerosis without evidence of aneurysm, dissection, vasculitis or significant stenosis. Renals: Marked severity calcification and atherosclerosis without evidence of aneurysm, dissection, vasculitis, fibromuscular dysplasia or significant stenosis. IMA: Patent without evidence of aneurysm, dissection, vasculitis or significant stenosis. Inflow: Marked severity calcification and atherosclerosis without evidence of aneurysm, dissection, vasculitis or significant stenosis. Proximal Outflow: Bilateral common  femoral and visualized portions of the superficial and profunda femoral arteries are patent without evidence of aneurysm, dissection, vasculitis or significant stenosis. Veins: No obvious venous abnormality within the limitations of this arterial phase study. Review of the MIP images confirms the above findings. NON-VASCULAR Lower chest: No acute abnormality. Hepatobiliary: There is diffuse fatty infiltration of the liver parenchyma. No focal liver abnormality is seen. Status post cholecystectomy. No biliary dilatation.  Pancreas: Unremarkable. No pancreatic ductal dilatation or surrounding inflammatory changes. Spleen: Normal in size without focal abnormality. Adrenals/Urinary Tract: Adrenal glands are unremarkable. Kidneys are normal, without renal calculi, focal lesion, or hydronephrosis. Bladder is unremarkable. Stomach/Bowel: There is a large hiatal hernia. Postoperative changes seen consistent with prior right hemicolectomy. No evidence of bowel wall thickening, distention, or inflammatory changes. Lymphatic: No abnormal abdominal or pelvic lymph nodes are identified. Reproductive: Uterus and bilateral adnexa are unremarkable. Other: No abdominal wall hernia or abnormality. No abdominopelvic ascites. Musculoskeletal: A total left hip replacement is seen with associated streak artifact and subsequently limited evaluation of the adjacent osseous and soft tissue structures. Degenerative changes are noted throughout the lumbar spine. IMPRESSION: 1. Marked severity atherosclerotic disease without evidence of aneurysm or dissection. 2. Large hiatal hernia. 3. Hepatic steatosis. 4. Status post cholecystectomy. 5. Total left hip replacement. Aortic Atherosclerosis (ICD10-I70.0). Electronically Signed   By: Virgina Norfolk M.D.   On: 10/08/2020 19:41        Scheduled Meds:  diclofenac Sodium  4 g Topical QID   levothyroxine  100 mcg Oral Q0600   [START ON 10/12/2020] pantoprazole  40 mg Intravenous Q12H    sodium chloride flush  3 mL Intravenous Q12H   Continuous Infusions:  sodium chloride 50 mL/hr at 10/09/20 0655   pantoprazole 8 mg/hr (10/09/20 0826)     LOS: 1 day   Time spent: 2mns Greater than 50% of this time was spent in counseling, explanation of diagnosis, planning of further management, and coordination of care.   Voice Recognition /Viviann Sparedictation system was used to create this note, attempts have been made to correct errors. Please contact the author with questions and/or clarifications.   FFlorencia Reasons MD PhD FACP [facility]  Available via Epic secure chat 7am-7pm for nonurgent issues Please page for urgent issues To page the attending provider between 7A-7P or the covering provider during after hours 7P-7A, please log into the web site [email or web address] and access using universal [place] password for that web site. If you do not have the password, please call the hospital operator.    10/09/2020, 11:20 AM

## 2020-10-09 NOTE — Progress Notes (Signed)
Daughter and daughter's husband at bedside currently. Patient resting/restless intermittently. Remain on 3L [place], O2 sats 98%. VSS.

## 2020-10-09 NOTE — Progress Notes (Signed)
Patient noticeably short of breath. Placed on 3L nasal cannula and MD notified. CXR ordered. Breathing tx ordered and given.   Daughter at bedside and concerned about patient's mental status. Says she is more confused than usual, is seeing things etc. MD notified and speaking to daughter via telephone.

## 2020-10-09 NOTE — Progress Notes (Signed)
*  PRELIMINARY RESULTS* Echocardiogram 2D Echocardiogram has been performed.  Krystal Goodwin 10/09/2020, 1:04 PM

## 2020-10-09 NOTE — ED Notes (Signed)
Report given to Armanda Heritage RN.

## 2020-10-09 NOTE — Progress Notes (Addendum)
Patient becoming increasingly confused/agitated/fidgety as well as short of breath and wheezing. Daughter at bedside and very concerned as this is not patients baseline. MD notified. MD to bedside. ABG ordered, IV lasix, solumedrol.   I/O cath performed and urine sent to lab.

## 2020-10-09 NOTE — ED Notes (Signed)
Daughter at bedside states concerns to this RN about pt being more confused than baseline, Dr. Erlinda Hong secure messaged to see if she wanted to add a UA on pt to rule out a possible UTI causing this increased confusion.

## 2020-10-09 NOTE — Consult Note (Signed)
Krystal Goodwin , MD [address] [place], [place], [place], [address] [id number], [place], [place], [place], [address] Phone: [phone]  Fax: [phone]  Consultation  Referring Provider:    Dr Erlinda Hong Primary Care Physician:  Derinda Late, MD Primary Gastroenterologist: none          Reason for Consultation:     Anemia  Date of Admission:  10/08/2020 Date of Consultation:  10/09/2020         HPI:   Krystal Goodwin is a 87 y.o. female With a history of aortic stenosis s/p aortic valve repair, mild dementia, colon cancer status post partial colectomy, atrial fibrillation on Eliquis presented to the ER with low hemoglobin.  Apparently the daughter noted the patient to be pale and weak.  In the emergency room his hemoglobin of 4.7 g and 6 months back was 10.8 g, MCV 78.2, BUN of 37 and creatinine 1.6.  INR 1.8, ferritin of 4, iron and TIBC shows features of iron deficiency anemia.  Urinalysis shows moderate leukocytes and bacteria.  Hemoglobin this morning that is posttransfusion is 7.6 g  Patient provides limited history.  Appears to be confused at times but states he has not seen any blood outside the body.  She does note that she takes Eliquis and recollect she was taking it yesterday.  Denies any abdominal pain..  Past Medical History:  Diagnosis Date   Anemia    iron def.anemia   Aortic stenosis, severe    Asthma    Chest pain    Colon cancer (HCC)    Complication of anesthesia    nausea   Diabetes mellitus without complication (HCC)    Dyslipidemia    Dyspnea    Gout, joint    H/O postmenopausal osteoporosis    Hematuria    History of palpitations    Hx of colonic polyps    Hypertension    Hypothyroidism    IBS (irritable bowel syndrome)    Schatzki's ring    Sleep apnea     Past Surgical History:  Procedure Laterality Date   AORTIC VALVE REPLACEMENT     CARDIAC PACEMAKER PLACEMENT     CHOLECYSTECTOMY     COLON SURGERY  1997   RIGHT HEMICOLECTOMY    COLONOSCOPY     COLONOSCOPY WITH PROPOFOL N/A 09/07/2016   Procedure: COLONOSCOPY WITH PROPOFOL;  Surgeon: Manya Silvas, MD;  Location: [facility] ENDOSCOPY;  Service: Endoscopy;  Laterality: N/A;   ESOPHAGOGASTRODUODENOSCOPY     HIP ARTHROPLASTY Left 09/05/2019   Procedure: ARTHROPLASTY BIPOLAR HIP (HEMIARTHROPLASTY);  Surgeon: Corky Mull, MD;  Location: [facility] ORS;  Service: Orthopedics;  Laterality: Left;   TEE WITHOUT CARDIOVERSION  03/08/2013    Prior to Admission medications   Medication Sig Start Date End Date Taking? Authorizing Provider  acetaminophen (TYLENOL) 650 MG CR tablet Take 650 mg by mouth 3 (three) times daily.   Yes [provider]  atorvastatin (LIPITOR) 10 MG tablet Take 10 mg by mouth daily. 06/20/19  Yes [provider]  cetirizine (ZYRTEC) 10 MG tablet Take 10 mg by mouth daily.   Yes [provider]  Cholecalciferol (VITAMIN D3) 50 MCG (2000 UT) TABS Take 2,000 Units by mouth daily at 12 noon.   Yes [provider]  citalopram (CELEXA) 20 MG tablet Take 20 mg by mouth daily. 08/21/19  Yes [provider]  diclofenac Sodium (VOLTAREN) 1 % GEL Apply 4 g topically 4 (four) times daily.  Yes [provider]  ELIQUIS 5 MG TABS tablet Take 5 mg by mouth 2 (two) times daily. 08/21/19  Yes [provider]  furosemide (LASIX) 20 MG tablet Take 20 mg by mouth daily.   Yes [provider]  HYDROcodone-acetaminophen (NORCO) 7.5-325 MG tablet Take 1 tablet by mouth 3 (three) times daily.   Yes [provider]  levothyroxine (SYNTHROID) 100 MCG tablet Take 100 mcg by mouth daily before breakfast.    Yes [provider]  melatonin 5 MG TABS Take 5 mg by mouth at bedtime.   Yes [provider]  metoprolol succinate (TOPROL-XL) 25 MG 24 hr tablet Take 25 mg by mouth daily. 06/20/19  Yes [provider]  Multiple Vitamin (MULTIVITAMIN) tablet Take 1 tablet by mouth daily.   Yes  [provider]  senna-docusate (SENOKOT-S) 8.6-50 MG tablet Take 2 tablets by mouth at bedtime.   Yes [provider]  ALPRAZolam (XANAX) 0.25 MG tablet Take 1-2 tablets (0.25-0.5 mg total) by mouth every 4 (four) hours as needed for anxiety or sleep. Patient not taking: No sig reported 09/09/19   Fritzi Mandes, MD  ferrous sulfate 325 (65 FE) MG tablet Take 325 mg by mouth daily with breakfast. Patient not taking: No sig reported    [provider]  HYDROcodone-acetaminophen (NORCO/VICODIN) 5-325 MG tablet Take 1 tablet by mouth every 4 (four) hours as needed for moderate pain. Patient not taking: No sig reported 09/06/19   Lattie Corns, PA-C  losartan (COZAAR) 25 MG tablet Take 1 tablet (25 mg total) by mouth daily. Patient not taking: No sig reported 09/09/19   Fritzi Mandes, MD  Melatonin-Pyridoxine 1-10 MG TABS Take by mouth at bedtime as needed. Patient not taking: No sig reported    [provider]  pantoprazole (PROTONIX) 40 MG tablet Take 40 mg by mouth daily. Patient not taking: No sig reported    [provider]  traMADol (ULTRAM) 50 MG tablet Take 1 tablet (50 mg total) by mouth every 6 (six) hours as needed for moderate pain. Patient not taking: No sig reported 09/06/19   Lattie Corns, PA-C    Family History  Problem Relation Age of Onset   Breast cancer Neg Hx      Social History   Tobacco Use   Smoking status: Former    Packs/day: 1.00    Years: 20.00    Pack years: 20.00    Types: Cigarettes    Quit date: 02/21/1989    Years since quitting: 31.6   Smokeless tobacco: Never  Vaping Use   Vaping Use: Never used  Substance Use Topics   Alcohol use: No   Drug use: No    Allergies as of 10/08/2020 - Review Complete 10/08/2020  Allergen Reaction Noted   Amoxicillin Itching and Rash 08/19/2013   Erythromycin Nausea And Vomiting and Other (See Comments) 02/21/2013   Vicodin [hydrocodone-acetaminophen] Nausea And  Vomiting 09/06/2016    Review of Systems:    All systems reviewed and negative except where noted in HPI.   Physical Exam:  Vital signs in last 24 hours: Temp:  [97.1 F (36.2 C)-98.9 F (37.2 C)] 97.1 F (36.2 C) (09/02 0355) Pulse Rate:  [67-72] 70 (09/02 0900) Resp:  [11-24] 21 (09/02 0900) BP: (96-150)/(22-76) 134/56 (09/02 0900) SpO2:  [94 %-100 %] 96 % (09/02 0900) Weight:  [88.5 kg] 88.5 kg (09/01 1752)   General:   Pleasant, cooperative in NAD Head:  Normocephalic and atraumatic.  Eyes:   No icterus.   Conjunctiva pink. PERRLA. Ears:  Normal auditory acuity. Neck:  Supple; no masses or thyroidomegaly Lungs: Respirations even and unlabored. Lungs clear to auscultation bilaterally.   No wheezes, crackles, or rhonchi.  Heart:  Regular rate and rhythm;  Without murmur, clicks, rubs or gallops Abdomen:  Soft, nondistended, nontender. Normal bowel sounds. No appreciable masses or hepatomegaly.  No rebound or guarding.  Neurologic:  Alert and oriented x1;  grossly normal neurologically. Psych:  Alert and cooperative.  Confused at times  LAB RESULTS: Recent Labs    10/08/20 1757 10/09/20 0019 10/09/20 0625  WBC 7.1  --   --   HGB 4.7* 7.3* 7.6*  HCT 16.9* 23.7* 25.1*  PLT 203  --   --    BMET Recent Labs    10/08/20 1757 10/09/20 0625  NA 136 138  K 4.7 4.0  CL 110 110  CO2 18* 22  GLUCOSE 94 95  BUN 37* 34*  CREATININE 1.60* 1.50*  CALCIUM 8.8* 8.6*   LFT Recent Labs    10/09/20 0625  PROT 6.0*  ALBUMIN 2.9*  AST 19  ALT 13  ALKPHOS 57  BILITOT 1.1   PT/INR Recent Labs    10/09/20 0019  LABPROT 21.0*  INR 1.8*    STUDIES: CT Angio Abd/Pel W and/or Wo Contrast  Result Date: 10/08/2020 CLINICAL DATA:  Abnormal labs with low hemoglobin level. EXAM: CTA ABDOMEN AND PELVIS WITHOUT AND WITH CONTRAST TECHNIQUE: Multidetector CT imaging of the abdomen and pelvis was performed using the standard protocol during bolus administration of intravenous  contrast. Multiplanar reconstructed images and MIPs were obtained and reviewed to evaluate the vascular anatomy. CONTRAST:  30m OMNIPAQUE IOHEXOL 350 MG/ML SOLN COMPARISON:  August 02, 2013 FINDINGS: VASCULAR Aorta: Marked severity calcification and atherosclerosis without aneurysm, dissection, vasculitis or significant stenosis. Celiac: Marked severity calcification and atherosclerosis without evidence of aneurysm, dissection, vasculitis or significant stenosis. SMA: Marked severity calcification and atherosclerosis without evidence of aneurysm, dissection, vasculitis or significant stenosis. Renals: Marked severity calcification and atherosclerosis without evidence of aneurysm, dissection, vasculitis, fibromuscular dysplasia or significant stenosis. IMA: Patent without evidence of aneurysm, dissection, vasculitis or significant stenosis. Inflow: Marked severity calcification and atherosclerosis without evidence of aneurysm, dissection, vasculitis or significant stenosis. Proximal Outflow: Bilateral common femoral and visualized portions of the superficial and profunda femoral arteries are patent without evidence of aneurysm, dissection, vasculitis or significant stenosis. Veins: No obvious venous abnormality within the limitations of this arterial phase study. Review of the MIP images confirms the above findings. NON-VASCULAR Lower chest: No acute abnormality. Hepatobiliary: There is diffuse fatty infiltration of the liver parenchyma. No focal liver abnormality is seen. Status post cholecystectomy. No biliary dilatation. Pancreas: Unremarkable. No pancreatic ductal dilatation or surrounding inflammatory changes. Spleen: Normal in size without focal abnormality. Adrenals/Urinary Tract: Adrenal glands are unremarkable. Kidneys are normal, without renal calculi, focal lesion, or hydronephrosis. Bladder is unremarkable. Stomach/Bowel: There is a large hiatal hernia. Postoperative changes seen consistent with prior  right hemicolectomy. No evidence of bowel wall thickening, distention, or inflammatory changes. Lymphatic: No abnormal abdominal or pelvic lymph nodes are identified. Reproductive: Uterus and bilateral adnexa are unremarkable. Other: No abdominal wall hernia or abnormality. No abdominopelvic ascites. Musculoskeletal: A total left hip replacement is seen with associated streak artifact and subsequently limited evaluation of the adjacent osseous and soft tissue structures. Degenerative changes are noted throughout the lumbar spine. IMPRESSION: 1. Marked severity atherosclerotic disease without evidence of aneurysm or dissection.  2. Large hiatal hernia. 3. Hepatic steatosis. 4. Status post cholecystectomy. 5. Total left hip replacement. Aortic Atherosclerosis (ICD10-I70.0). Electronically Signed   By: Virgina Norfolk M.D.   On: 10/08/2020 19:41      Impression / Plan:   ALAYCIA KOKER is a 87 y.o. y/o female with a history of mild dementia, atrial fibrillation on Eliquis, aortic stenosis status post valve repair admitted to the hospital when she presented with weakness and appears to be pain.  No overt blood loss reported.  Her iron studies and CBC shows a microcytic anemia consistent with iron deficiency anemia which would not indicate an acute blood loss anemia.  This is likely a chronic process exacerbated by the being on Eliquis.Last colonoscopy 2018 showed no large polyps.  In addition no elevation in BUN/creatinine ratio makes it less likely an acute upper GI bleed  Plan 1.  Monitor CBC and transfuse as needed, IV PPI. 2.  Hold Eliquis and once she is 2 days off Eliquis we will plan for EGD and colonoscopy when she can take the bowel prep and get cleaned out.  Tentatively for Sunday 3.  IV iron 4.  Check B12 folate levels  I have discussed the plan with Dr. Erlinda Hong  I have discussed alternative options, risks & benefits,  which include, but are not limited to, bleeding, infection,  perforation,respiratory complication & drug reaction.  The patient agrees with this plan & written consent will be obtained.     Thank you for involving me in the care of this patient.      LOS: 1 day   Krystal Bellows, MD  10/09/2020, 10:47 AM

## 2020-10-09 NOTE — ED Notes (Signed)
Pt lying in bed asleep after being placed on pur wick for urinary relief. Pt at this time stable and in no obvious distress.

## 2020-10-09 NOTE — ED Notes (Signed)
ED TO INPATIENT HANDOFF REPORT  ED Nurse Name and Phone #:  Fabio Neighbors RN [phone]  S Name/Age/Gender Shonna Chock 87 y.o. female Room/Bed: ED31A/ED31A  Code Status   Code Status: Full Code  Home/SNF/Other Nursing Home Patient oriented to: self Is this baseline? Yes   Triage Complete: Triage complete  Chief Complaint GI bleed [K92.2]  Triage Note Pt comes into the ED via [facility] from [facility] c/o abnormal labs.  Pt had Hgb of 4.2.  Pt only c/o fatigue at this time. Staff did not report black stools, but patient presents pale in color.   118/88 70Hr H/o dementia.      Allergies Allergies  Allergen Reactions  . Amoxicillin Itching and Rash    Breaks out in a rash On hands On hands   . Erythromycin Nausea And Vomiting and Other (See Comments)  . Vicodin [Hydrocodone-Acetaminophen] Nausea And Vomiting    Level of Care/Admitting Diagnosis ED Disposition    ED Disposition  Admit   Condition  --   [place]: [place] [[id number]]  Level of Care: Progressive Cardiac [106]  Admit to Progressive based on following criteria: GI, ENDOCRINE disease patients with GI bleeding, acute liver failure or pancreatitis, stable with diabetic ketoacidosis or thyrotoxicosis (hypothyroid) state.  Covid Evaluation: Asymptomatic Screening Protocol (No Symptoms)  Diagnosis: GI bleed [id number]  Admitting Physician: Clance Boll [id number]  Attending Physician: Clance Boll [id number]  Estimated length of stay: 3 - 4 days  Certification:: I certify this patient will need inpatient services for at least 2 midnights         B Medical/Surgery History Past Medical History:  Diagnosis Date  . Anemia    iron def.anemia  . Aortic stenosis, severe   . Asthma   . Chest pain   . Colon cancer ([place])   . Complication of anesthesia    nausea  . Diabetes mellitus without complication ([place])   . Dyslipidemia   .  Dyspnea   . Gout, joint   . H/O postmenopausal osteoporosis   . Hematuria   . History of palpitations   . Hx of colonic polyps   . Hypertension   . Hypothyroidism   . IBS (irritable bowel syndrome)   . Schatzki's ring   . Sleep apnea    Past Surgical History:  Procedure Laterality Date  . AORTIC VALVE REPLACEMENT    . CARDIAC PACEMAKER PLACEMENT    . CHOLECYSTECTOMY    . COLON SURGERY  1997   RIGHT HEMICOLECTOMY  . COLONOSCOPY    . COLONOSCOPY WITH PROPOFOL N/A 09/07/2016   Procedure: COLONOSCOPY WITH PROPOFOL;  Surgeon: Manya Silvas, MD;  Location[facility] ENDOSCOPY;  Service: Endoscopy;  Laterality: N/A;  . ESOPHAGOGASTRODUODENOSCOPY    . HIP ARTHROPLASTY Left 09/05/2019   Procedure: ARTHROPLASTY BIPOLAR HIP (HEMIARTHROPLASTY);  Surgeon: Corky Mull, MD;  Location: [facility] ORS;  Service: Orthopedics;  Laterality: Left;  . TEE WITHOUT CARDIOVERSION  03/08/2013     A IV Location/Drains/Wounds Patient Lines/Drains/Airways Status    Active Line/Drains/Airways    Name Placement date Placement time Site Days   Peripheral IV 10/08/20 20 G Left Forearm 10/08/20  1806  Forearm  1   Peripheral IV 10/08/20 20 G 1.16" Right Forearm 10/08/20  1941  Forearm  1   Incision (Closed) 09/05/19 Hip Left 09/05/19  1558  -- 400          Intake/Output Last 24 hours  Intake/Output Summary (Last 24 hours) at 10/09/2020 1949 Last data filed at 10/09/2020 0053 Gross per 24 hour  Intake 456.5 ml  Output --  Net 456.5 ml    Labs/Imaging Results for orders placed or performed during the hospital encounter of 10/08/20 (from the past 48 hour(s))  Comprehensive metabolic panel     Status: Abnormal   Collection Time: 10/08/20  5:57 PM  Result Value Ref Range   Sodium 136 135 - 145 mmol/L   Potassium 4.7 3.5 - 5.1 mmol/L   Chloride 110 98 - 111 mmol/L   CO2 18 (L) 22 - 32 mmol/L   Glucose, Bld 94 70 - 99 mg/dL    Comment: Glucose reference range applies only to samples taken after fasting for  at least 8 hours.   BUN 37 (H) 8 - 23 mg/dL   Creatinine, Ser 1.60 (H) 0.44 - 1.00 mg/dL   Calcium 8.8 (L) 8.9 - 10.3 mg/dL   Total Protein 6.3 (L) 6.5 - 8.1 g/dL   Albumin 3.3 (L) 3.5 - 5.0 g/dL   AST 28 15 - 41 U/L   ALT 13 0 - 44 U/L   Alkaline Phosphatase 65 38 - 126 U/L   Total Bilirubin 0.6 0.3 - 1.2 mg/dL   GFR, Estimated 31 (L) >60 mL/min    Comment: (NOTE) Calculated using the CKD-EPI Creatinine Equation (2021)    Anion gap 8 5 - 15    Comment: Performed at [facility], [address] [place], [place] [address]  CBC     Status: Abnormal   Collection Time: 10/08/20  5:57 PM  Result Value Ref Range   WBC 7.1 4.0 - 10.5 K/uL   RBC 2.16 (L) 3.87 - 5.11 MIL/uL   Hemoglobin 4.7 (LL) 12.0 - 15.0 g/dL    Comment: REPEATED TO VERIFY Reticulocyte Hemoglobin testing may be clinically indicated, consider ordering this additional test [id number] THIS CRITICAL RESULT HAS VERIFIED AND BEEN CALLED TO CALEB BURGESS BY SAINVIL SAINVILUS ON 09 01 2022 AT [place], AND HAS BEEN READ BACK.     HCT 16.9 (L) 36.0 - 46.0 %   MCV 78.2 (L) 80.0 - 100.0 fL   MCH 21.8 (L) 26.0 - 34.0 pg   MCHC 27.8 (L) 30.0 - 36.0 g/dL   RDW 17.1 (H) 11.5 - 15.5 %   Platelets 203 150 - 400 K/uL   nRBC 0.6 (H) 0.0 - 0.2 %    Comment: Performed at [facility], [address] [place], [place] [address]  Type and screen [place]     Status: None (Preliminary result)   Collection Time: 10/08/20  5:57 PM  Result Value Ref Range   ABO/RH(D) O POS    Antibody Screen NEG    Sample Expiration 10/11/2020,2359    Unit Number [id number]    Blood Component Type RED CELLS,LR    Unit division 00    Status of Unit ISSUED,FINAL    Transfusion Status OK TO TRANSFUSE    Crossmatch Result      Compatible Performed at [facility], [address] [place], [place] [address]    Unit Number [id number]    Blood Component Type RED CELLS,LR    Unit division 00     Status of Unit ISSUED,FINAL    Transfusion Status OK TO TRANSFUSE    Crossmatch Result Compatible    Unit Number [id number]    Blood Component Type RED CELLS,LR    Unit division 00  Status of Unit ALLOCATED    Transfusion Status OK TO TRANSFUSE    Crossmatch Result Compatible   Prepare RBC (crossmatch)     Status: None   Collection Time: 10/08/20  7:00 PM  Result Value Ref Range   Order Confirmation      ORDER PROCESSED BY BLOOD BANK Performed at [facility], [address] [place], [place] [address]   Resp Panel by RT-PCR (Flu A&B, Covid) Nasopharyngeal Swab     Status: None   Collection Time: 10/08/20  8:52 PM   Specimen: Nasopharyngeal Swab; Nasopharyngeal(NP) swabs in vial transport medium  Result Value Ref Range   SARS Coronavirus 2 by RT PCR NEGATIVE NEGATIVE    Comment: (NOTE) SARS-CoV-2 target nucleic acids are NOT DETECTED.  The SARS-CoV-2 RNA is generally detectable in upper respiratory specimens during the acute phase of infection. The lowest concentration of SARS-CoV-2 viral copies this assay can detect is 138 copies/mL. A negative result does not preclude SARS-Cov-2 infection and should not be used as the sole basis for treatment or other patient management decisions. A negative result may occur with  improper specimen collection/handling, submission of specimen other than nasopharyngeal swab, presence of viral mutation(s) within the areas targeted by this assay, and inadequate number of viral copies(<138 copies/mL). A negative result must be combined with clinical observations, patient history, and epidemiological information. The expected result is Negative.  Fact Sheet for Patients:  EntrepreneurPulse.com.au  Fact Sheet for Healthcare Providers:  IncredibleEmployment.be  This test is no t yet approved or cleared by the [place] FDA and  has been authorized for detection and/or diagnosis of SARS-CoV-2  by FDA under an Emergency Use Authorization (EUA). This EUA will remain  in effect (meaning this test can be used) for the duration of the COVID-19 declaration under Section 564(b)(1) of the Act, 21 U.S.C.section 360bbb-3(b)(1), unless the authorization is terminated  or revoked sooner.       Influenza A by PCR NEGATIVE NEGATIVE   Influenza B by PCR NEGATIVE NEGATIVE    Comment: (NOTE) The Xpert Xpress SARS-CoV-2/FLU/RSV plus assay is intended as an aid in the diagnosis of influenza from Nasopharyngeal swab specimens and should not be used as a sole basis for treatment. Nasal washings and aspirates are unacceptable for Xpert Xpress SARS-CoV-2/FLU/RSV testing.  Fact Sheet for Patients: EntrepreneurPulse.com.au  Fact Sheet for Healthcare Providers: IncredibleEmployment.be  This test is not yet approved or cleared by the [place] FDA and has been authorized for detection and/or diagnosis of SARS-CoV-2 by FDA under an Emergency Use Authorization (EUA). This EUA will remain in effect (meaning this test can be used) for the duration of the COVID-19 declaration under Section 564(b)(1) of the Act, 21 U.S.C. section 360bbb-3(b)(1), unless the authorization is terminated or revoked.  Performed at [facility], [address] [place], [place] [address]   Protime-INR     Status: Abnormal   Collection Time: 10/09/20 12:19 AM  Result Value Ref Range   Prothrombin Time 21.0 (H) 11.4 - 15.2 seconds   INR 1.8 (H) 0.8 - 1.2    Comment: (NOTE) INR goal varies based on device and disease states. Performed at [facility], [address] [place], [place] [address]   TSH     Status: None   Collection Time: 10/09/20 12:19 AM  Result Value Ref Range   TSH 0.615 0.350 - 4.500 uIU/mL    Comment: Performed by a 3rd Generation assay with a functional sensitivity of <=0.01 uIU/mL.  Performed at [facility], [address] [place], [place] [address]   Hemoglobin and hematocrit, blood     Status: Abnormal   Collection Time: 10/09/20 12:19 AM  Result Value Ref Range   Hemoglobin 7.3 (L) 12.0 - 15.0 g/dL    Comment: REPEATED TO VERIFY   HCT 23.7 (L) 36.0 - 46.0 %    Comment: Performed at [facility], [address], [place], [place] [address]  Brain natriuretic peptide     Status: Abnormal   Collection Time: 10/09/20 12:19 AM  Result Value Ref Range   B Natriuretic Peptide 675.2 (H) 0.0 - 100.0 pg/mL    Comment: Performed at [facility], [address] [place], [place] [address]  D-dimer, quantitative     Status: Abnormal   Collection Time: 10/09/20 12:19 AM  Result Value Ref Range   D-Dimer, Quant 0.63 (H) 0.00 - 0.50 ug/mL-FEU    Comment: (NOTE) At the manufacturer cut-off value of 0.5 g/mL FEU, this assay has a negative predictive value of 95-100%.This assay is intended for use in conjunction with a clinical pretest probability (PTP) assessment model to exclude pulmonary embolism (PE) and deep venous thrombosis (DVT) in outpatients suspected of PE or DVT. Results should be correlated with clinical presentation. Performed at [facility], [address] [place], [place] [address]   Ferritin     Status: Abnormal   Collection Time: 10/09/20 12:19 AM  Result Value Ref Range   Ferritin 4 (L) 11 - 307 ng/mL    Comment: Performed at [facility], [address] [place], [place] [address]  Iron and TIBC     Status: Abnormal   Collection Time: 10/09/20 12:19 AM  Result Value Ref Range   Iron 12 (L) 28 - 170 ug/dL   TIBC 463 (H) 250 - 450 ug/dL   Saturation Ratios 3 (L) 10.4 - 31.8 %   UIBC 451 ug/dL    Comment: Performed at [facility], [address], [place], [place] [address]  Uric acid     Status: Abnormal   Collection Time: 10/09/20 12:19 AM  Result Value Ref Range   Uric Acid, Serum 8.0 (H) 2.5 - 7.1 mg/dL    Comment: Performed at  [facility], [address] [place], [place] [address]  CBG monitoring, ED     Status: Abnormal   Collection Time: 10/09/20  3:54 AM  Result Value Ref Range   Glucose-Capillary 102 (H) 70 - 99 mg/dL    Comment: Glucose reference range applies only to samples taken after fasting for at least 8 hours.  Comprehensive metabolic panel     Status: Abnormal   Collection Time: 10/09/20  6:25 AM  Result Value Ref Range   Sodium 138 135 - 145 mmol/L   Potassium 4.0 3.5 - 5.1 mmol/L   Chloride 110 98 - 111 mmol/L   CO2 22 22 - 32 mmol/L   Glucose, Bld 95 70 - 99 mg/dL    Comment: Glucose reference range applies only to samples taken after fasting for at least 8 hours.   BUN 34 (H) 8 - 23 mg/dL   Creatinine, Ser 1.50 (H) 0.44 - 1.00 mg/dL   Calcium 8.6 (L) 8.9 - 10.3 mg/dL   Total Protein 6.0 (L) 6.5 - 8.1 g/dL   Albumin 2.9 (L) 3.5 - 5.0 g/dL   AST 19 15 - 41 U/L   ALT 13 0 - 44 U/L   Alkaline Phosphatase 57  38 - 126 U/L   Total Bilirubin 1.1 0.3 - 1.2 mg/dL   GFR, Estimated 34 (L) >60 mL/min    Comment: (NOTE) Calculated using the CKD-EPI Creatinine Equation (2021)    Anion gap 6 5 - 15    Comment: Performed at [facility], [address] [place], [place] [address]  Hemoglobin and hematocrit, blood     Status: Abnormal   Collection Time: 10/09/20  6:25 AM  Result Value Ref Range   Hemoglobin 7.6 (L) 12.0 - 15.0 g/dL   HCT 25.1 (L) 36.0 - 46.0 %    Comment: Performed at [facility], [address] [place], [place] [address]  Hemoglobin A1c     Status: None   Collection Time: 10/09/20  6:25 AM  Result Value Ref Range   Hgb A1c MFr Bld 5.5 4.8 - 5.6 %    Comment: (NOTE) Pre diabetes:          5.7%-6.4%  Diabetes:              >6.4%  Glycemic control for   <7.0% adults with diabetes    Mean Plasma Glucose 111.15 mg/dL    Comment: Performed at [facility] [address], [place], [place] [address]  C-reactive protein     Status: None    Collection Time: 10/09/20  6:25 AM  Result Value Ref Range   CRP 0.7 <1.0 mg/dL    Comment: Performed at [facility] [address], [place], [place] [address]  CBG monitoring, ED     Status: None   Collection Time: 10/09/20  7:51 AM  Result Value Ref Range   Glucose-Capillary 95 70 - 99 mg/dL    Comment: Glucose reference range applies only to samples taken after fasting for at least 8 hours.  Urinalysis, Complete w Microscopic Urine, Clean Catch     Status: Abnormal   Collection Time: 10/09/20  9:31 AM  Result Value Ref Range   Color, Urine YELLOW (A) YELLOW   APPearance HAZY (A) CLEAR   Specific Gravity, Urine 1.038 (H) 1.005 - 1.030   pH 5.0 5.0 - 8.0   Glucose, UA NEGATIVE NEGATIVE mg/dL   Hgb urine dipstick NEGATIVE NEGATIVE   Bilirubin Urine NEGATIVE NEGATIVE   Ketones, ur NEGATIVE NEGATIVE mg/dL   Protein, ur NEGATIVE NEGATIVE mg/dL   Nitrite NEGATIVE NEGATIVE   Leukocytes,Ua MODERATE (A) NEGATIVE   RBC / HPF 0-5 0 - 5 RBC/hpf   WBC, UA 21-50 0 - 5 WBC/hpf   Bacteria, UA RARE (A) NONE SEEN   Squamous Epithelial / LPF 0-5 0 - 5    Comment: Performed at [facility], [address] [place], [place] [address]  Hemoglobin and hematocrit, blood     Status: Abnormal   Collection Time: 10/09/20 11:00 AM  Result Value Ref Range   Hemoglobin 7.4 (L) 12.0 - 15.0 g/dL   HCT 23.6 (L) 36.0 - 46.0 %    Comment: Performed at [facility], [address] [place], [place] [address]  Folate     Status: None   Collection Time: 10/09/20 11:00 AM  Result Value Ref Range   Folate 42.0 >5.9 ng/mL    Comment: Performed at [facility], [address] [place], [place] [address]  Hemoglobin and hematocrit, blood     Status: Abnormal   Collection Time: 10/09/20  5:38 PM  Result Value Ref Range   Hemoglobin 7.5 (L) 12.0 - 15.0 g/dL  HCT 23.8 (L) 36.0 - 46.0 %    Comment: Performed at [facility], [address] [place], [place] [address]   Blood gas, arterial     Status: Abnormal   Collection Time: 10/09/20  5:59 PM  Result Value Ref Range   FIO2 32.00    Delivery systems NASAL CANNULA    pH, Arterial 7.41 7.350 - 7.450   pCO2 arterial 29 (L) 32.0 - 48.0 mmHg   pO2, Arterial 101 83.0 - 108.0 mmHg   Bicarbonate 18.4 (L) 20.0 - 28.0 mmol/L   Acid-base deficit 5.6 (H) 0.0 - 2.0 mmol/L   O2 Saturation 97.9 %   Patient temperature 37.0    Collection site BRACHIAL ARTERY    Sample type ARTERIAL DRAW    Allens test (pass/fail) PASS PASS    Comment: Performed at [facility], [address] [place], [place] [address]  CBG monitoring, ED     Status: None   Collection Time: 10/09/20  6:20 PM  Result Value Ref Range   Glucose-Capillary 94 70 - 99 mg/dL    Comment: Glucose reference range applies only to samples taken after fasting for at least 8 hours.   DG Chest Port 1 View  Result Date: 10/09/2020 CLINICAL DATA:  Shortness of breath EXAM: PORTABLE CHEST 1 VIEW COMPARISON:  Radiograph 09/04/2019 FINDINGS: Unchanged cardiomediastinal silhouette with prior median sternotomy, aortic valve replacement, and dual chamber pacemaker leads. There is no new focal airspace disease. There is no large pleural effusion or visible pneumothorax. No acute osseous abnormality. Unchanged appearance of the right glenoid. Right upper quadrant surgical clips, likely prior cholecystectomy. Unchanged calcification overlying the right axilla. IMPRESSION: No evidence of acute cardiopulmonary disease. Electronically Signed   By: Maurine Simmering M.D.   On: 10/09/2020 15:07   ECHOCARDIOGRAM COMPLETE  Result Date: 10/09/2020    ECHOCARDIOGRAM REPORT   Patient Name:   LAJUAN FRABLE Date of Exam: 10/09/2020 Medical Rec #:  [id number]    Height:       65.0 in Accession #:    [id number]   Weight:       195.1 lb Date of Birth:  31-Aug-1933     BSA:          1.957 m Patient Age:    56 years     BP:           128/46 mmHg Patient Gender: F            HR:           78  bpm. Exam Location:  [facility] Procedure: 2D Echo, Color Doppler and Cardiac Doppler Indications:     I50.9 Congestive Heart Failure  History:         Patient has no prior history of Echocardiogram examinations.                  Aortic Valve Disease; Risk Factors:Diabetes, Dyslipidemia,                  Hypertension and Sleep Apnea. Asthma.  Sonographer:     Charmayne Sheer Referring Phys:  [id number] [facility] A THOMAS Diagnosing Phys: Neoma Laming  Sonographer Comments: Suboptimal parasternal window and no subcostal window. IMPRESSIONS  1. Left ventricular ejection fraction, by estimation, is 50 to 55%. The left ventricle has low normal function. The left ventricle has no regional wall motion abnormalities. There is mild concentric left ventricular hypertrophy. Left ventricular diastolic parameters are consistent with Grade II diastolic dysfunction (  pseudonormalization).  2. Right ventricular systolic function is mildly reduced. The right ventricular size is mildly enlarged.  3. Left atrial size was mildly dilated.  4. Right atrial size was mildly dilated.  5. The mitral valve is degenerative. Mild mitral valve regurgitation. No evidence of mitral stenosis. Moderate mitral annular calcification.  6. The aortic valve is calcified. Aortic valve regurgitation is trivial. Mild aortic valve sclerosis is present, with no evidence of aortic valve stenosis.  7. The inferior vena cava is normal in size with greater than 50% respiratory variability, suggesting right atrial pressure of 3 mmHg. FINDINGS  Left Ventricle: Left ventricular ejection fraction, by estimation, is 50 to 55%. The left ventricle has low normal function. The left ventricle has no regional wall motion abnormalities. The left ventricular internal cavity size was normal in size. There is mild concentric left ventricular hypertrophy. Left ventricular diastolic parameters are consistent with Grade II diastolic dysfunction (pseudonormalization). Right Ventricle: The right  ventricular size is mildly enlarged. No increase in right ventricular wall thickness. Right ventricular systolic function is mildly reduced. Left Atrium: Left atrial size was mildly dilated. Right Atrium: Right atrial size was mildly dilated. Pericardium: There is no evidence of pericardial effusion. Mitral Valve: The mitral valve is degenerative in appearance. Moderate mitral annular calcification. Mild mitral valve regurgitation. No evidence of mitral valve stenosis. MV peak gradient, 9.1 mmHg. The mean mitral valve gradient is 4.0 mmHg. Tricuspid Valve: The tricuspid valve is normal in structure. Tricuspid valve regurgitation is not demonstrated. No evidence of tricuspid stenosis. Aortic Valve: The aortic valve is calcified. Aortic valve regurgitation is trivial. Mild aortic valve sclerosis is present, with no evidence of aortic valve stenosis. Aortic valve mean gradient measures 16.0 mmHg. Aortic valve peak gradient measures 28.3  mmHg. Aortic valve area, by VTI measures 1.64 cm. Pulmonic Valve: The pulmonic valve was normal in structure. Pulmonic valve regurgitation is not visualized. No evidence of pulmonic stenosis. Aorta: The aortic root is normal in size and structure. Venous: The inferior vena cava is normal in size with greater than 50% respiratory variability, suggesting right atrial pressure of 3 mmHg. IAS/Shunts: No atrial level shunt detected by color flow Doppler.  LEFT VENTRICLE PLAX 2D LVIDd:         4.26 cm  Diastology LVIDs:         3.08 cm  LV e' medial:    5.77 cm/s LV PW:         0.98 cm  LV E/e' medial:  27.0 LV IVS:        0.68 cm  LV e' lateral:   10.20 cm/s LVOT diam:     1.90 cm  LV E/e' lateral: 15.3 LV SV:         96 LV SV Index:   49 LVOT Area:     2.84 cm  LEFT ATRIUM             Index LA diam:        4.30 cm 2.20 cm/m LA Vol (A2C):   26.4 ml 13.49 ml/m LA Vol (A4C):   42.9 ml 21.92 ml/m LA Biplane Vol: 36.9 ml 18.85 ml/m  AORTIC VALVE                    PULMONIC VALVE AV Area  (Vmax):    1.47 cm     PV Vmax:       0.70 m/s AV Area (Vmean):   1.44 cm     PV  Vmean:      48.800 cm/s AV Area (VTI):     1.64 cm     PV VTI:        0.118 m AV Vmax:           266.00 cm/s  PV Peak grad:  1.9 mmHg AV Vmean:          186.500 cm/s PV Mean grad:  1.0 mmHg AV VTI:            0.583 m AV Peak Grad:      28.3 mmHg AV Mean Grad:      16.0 mmHg LVOT Vmax:         138.00 cm/s LVOT Vmean:        94.700 cm/s LVOT VTI:          0.338 m LVOT/AV VTI ratio: 0.58  AORTA Ao Root diam: 2.10 cm MITRAL VALVE MV Area (PHT): 4.46 cm     SHUNTS MV Area VTI:   2.16 cm     Systemic VTI:  0.34 m MV Peak grad:  9.1 mmHg     Systemic Diam: 1.90 cm MV Mean grad:  4.0 mmHg MV Vmax:       1.51 m/s MV Vmean:      91.9 cm/s MV Decel Time: 170 msec MV E velocity: 156.00 cm/s MV A velocity: 113.00 cm/s MV E/A ratio:  1.38 Shaukat Khan Electronically signed by Neoma Laming Signature Date/Time: 10/09/2020/3:08:16 PM    Final    CT Angio Abd/Pel W and/or Wo Contrast  Result Date: 10/08/2020 CLINICAL DATA:  Abnormal labs with low hemoglobin level. EXAM: CTA ABDOMEN AND PELVIS WITHOUT AND WITH CONTRAST TECHNIQUE: Multidetector CT imaging of the abdomen and pelvis was performed using the standard protocol during bolus administration of intravenous contrast. Multiplanar reconstructed images and MIPs were obtained and reviewed to evaluate the vascular anatomy. CONTRAST:  50m OMNIPAQUE IOHEXOL 350 MG/ML SOLN COMPARISON:  August 02, 2013 FINDINGS: VASCULAR Aorta: Marked severity calcification and atherosclerosis without aneurysm, dissection, vasculitis or significant stenosis. Celiac: Marked severity calcification and atherosclerosis without evidence of aneurysm, dissection, vasculitis or significant stenosis. SMA: Marked severity calcification and atherosclerosis without evidence of aneurysm, dissection, vasculitis or significant stenosis. Renals: Marked severity calcification and atherosclerosis without evidence of aneurysm, dissection,  vasculitis, fibromuscular dysplasia or significant stenosis. IMA: Patent without evidence of aneurysm, dissection, vasculitis or significant stenosis. Inflow: Marked severity calcification and atherosclerosis without evidence of aneurysm, dissection, vasculitis or significant stenosis. Proximal Outflow: Bilateral common femoral and visualized portions of the superficial and profunda femoral arteries are patent without evidence of aneurysm, dissection, vasculitis or significant stenosis. Veins: No obvious venous abnormality within the limitations of this arterial phase study. Review of the MIP images confirms the above findings. NON-VASCULAR Lower chest: No acute abnormality. Hepatobiliary: There is diffuse fatty infiltration of the liver parenchyma. No focal liver abnormality is seen. Status post cholecystectomy. No biliary dilatation. Pancreas: Unremarkable. No pancreatic ductal dilatation or surrounding inflammatory changes. Spleen: Normal in size without focal abnormality. Adrenals/Urinary Tract: Adrenal glands are unremarkable. Kidneys are normal, without renal calculi, focal lesion, or hydronephrosis. Bladder is unremarkable. Stomach/Bowel: There is a large hiatal hernia. Postoperative changes seen consistent with prior right hemicolectomy. No evidence of bowel wall thickening, distention, or inflammatory changes. Lymphatic: No abnormal abdominal or pelvic lymph nodes are identified. Reproductive: Uterus and bilateral adnexa are unremarkable. Other: No abdominal wall hernia or abnormality. No abdominopelvic ascites. Musculoskeletal: A total left hip replacement is seen with associated streak  artifact and subsequently limited evaluation of the adjacent osseous and soft tissue structures. Degenerative changes are noted throughout the lumbar spine. IMPRESSION: 1. Marked severity atherosclerotic disease without evidence of aneurysm or dissection. 2. Large hiatal hernia. 3. Hepatic steatosis. 4. Status post  cholecystectomy. 5. Total left hip replacement. Aortic Atherosclerosis (ICD10-I70.0). Electronically Signed   By: Virgina Norfolk M.D.   On: 10/08/2020 19:41    Pending Labs Unresulted Labs (From admission, onward)    Start     Ordered   10/10/20 [id number]  Basic metabolic panel  Tomorrow morning,   STAT        10/09/20 0919   10/10/20 0500  Magnesium  Tomorrow morning,   STAT        10/09/20 0919   10/09/20 1622  Urine Culture  Once,   STAT       Question:  Indication  Answer:  Dysuria   10/09/20 1621   10/09/20 1054  Vitamin B12  Add-on,   AD        10/09/20 1053          Vitals/Pain Today's Vitals   10/09/20 1800 10/09/20 1815 10/09/20 1900 10/09/20 1930  BP: 119/69  (!) 136/45 (!) 142/53  Pulse: 73 71 70 82  Resp: (!) 23 (!) 22 (!) 22 (!) 22  Temp:      TempSrc:      SpO2: 100% 100% 100% 99%  Weight:      Height:      PainSc:        Isolation Precautions No active isolations  Medications Medications  pantoprozole (PROTONIX) 80 mg /NS 100 mL infusion (8 mg/hr Intravenous New Bag/Given 10/09/20 0826)  pantoprazole (PROTONIX) injection 40 mg (has no administration in time range)  levothyroxine (SYNTHROID) tablet 100 mcg (100 mcg Oral Given 10/09/20 0655)  diclofenac Sodium (VOLTAREN) 1 % topical gel 4 g (4 g Topical Given 10/09/20 1647)  sodium chloride flush (NS) 0.9 % injection 3 mL (3 mLs Intravenous Not Given 10/09/20 0821)  morphine 2 MG/ML injection 1 mg (has no administration in time range)  0.9 %  sodium chloride infusion ( Intravenous Not Given 10/09/20 1619)  polyethylene glycol-electrolytes (NuLYTELY) solution 4,000 mL (has no administration in time range)  levalbuterol (XOPENEX) nebulizer solution 0.63 mg (0.63 mg Nebulization Given 10/09/20 1622)  ALPRAZolam (XANAX) tablet 0.25 mg (0.25 mg Oral Given 10/09/20 1645)  cefTRIAXone (ROCEPHIN) 1 g in sodium chloride 0.9 % 100 mL IVPB (has no administration in time range)  metoprolol tartrate (LOPRESSOR) tablet 12.5 mg (has  no administration in time range)  polyethylene glycol (MIRALAX / GLYCOLAX) packet 17 g (has no administration in time range)  0.9 %  sodium chloride infusion (0 mL/hr Intravenous Stopped 10/09/20 0053)  pantoprazole (PROTONIX) 80 mg /NS 100 mL IVPB (0 mg Intravenous Stopped 10/08/20 2044)  iohexol (OMNIPAQUE) 350 MG/ML injection 75 mL (75 mLs Intravenous Contrast Given 10/08/20 1847)  furosemide (LASIX) injection 60 mg (60 mg Intravenous Given 10/09/20 1751)  methylPREDNISolone sodium succinate (SOLU-MEDROL) 40 mg/mL injection 40 mg (40 mg Intravenous Given 10/09/20 1800)    Mobility non-ambulatory Moderate fall risk   Focused Assessments    R Recommendations: See Admitting Provider Note  Report given to:   Additional Notes:

## 2020-10-09 NOTE — ED Notes (Signed)
Informed RN bed assigned 

## 2020-10-09 NOTE — ED Notes (Signed)
Pt stated she needed have a BM, pt placed on bed pan but then pt stated  he did not have to go, new purewick placed and new brief placed.

## 2020-10-10 ENCOUNTER — Inpatient Hospital Stay: Payer: Medicare PPO

## 2020-10-10 DIAGNOSIS — D500 Iron deficiency anemia secondary to blood loss (chronic): Secondary | ICD-10-CM | POA: Diagnosis not present

## 2020-10-10 LAB — GLUCOSE, CAPILLARY
Glucose-Capillary: 135 mg/dL — ABNORMAL HIGH (ref 70–99)
Glucose-Capillary: 109 mg/dL — ABNORMAL HIGH (ref 70–99)
Glucose-Capillary: 151 mg/dL — ABNORMAL HIGH (ref 70–99)
Glucose-Capillary: 87 mg/dL (ref 70–99)
Glucose-Capillary: 97 mg/dL (ref 70–99)
Glucose-Capillary: 94 mg/dL (ref 70–99)

## 2020-10-10 LAB — BASIC METABOLIC PANEL
Anion gap: 5 (ref 5–15)
BUN: 27 mg/dL — ABNORMAL HIGH (ref 8–23)
CO2: 24 mmol/L (ref 22–32)
Calcium: 8.7 mg/dL — ABNORMAL LOW (ref 8.9–10.3)
Chloride: 113 mmol/L — ABNORMAL HIGH (ref 98–111)
Creatinine, Ser: 1.34 mg/dL — ABNORMAL HIGH (ref 0.44–1.00)
GFR, Estimated: 39 mL/min — ABNORMAL LOW (ref >60)
Glucose, Bld: 143 mg/dL — ABNORMAL HIGH (ref 70–99)
Potassium: 4.3 mmol/L (ref 3.5–5.1)
Sodium: 142 mmol/L (ref 135–145)

## 2020-10-10 LAB — HEMOGLOBIN
Hemoglobin: 7.9 g/dL — ABNORMAL LOW (ref 12.0–15.0)
Hemoglobin: 7.1 g/dL — ABNORMAL LOW (ref 12.0–15.0)

## 2020-10-10 LAB — MAGNESIUM: Magnesium: 2 mg/dL (ref 1.7–2.4)

## 2020-10-10 MED ORDER — CYCLOBENZAPRINE HCL 10 MG PO TABS
5.0000 mg | ORAL_TABLET | Freq: Three times a day (TID) | ORAL | Status: DC | PRN
  Administered 2020-10-10 – 2020-10-11 (×2): 5 mg via ORAL
  Filled 2020-10-10 (×2): qty 1

## 2020-10-10 MED ORDER — PEG 3350-KCL-NA BICARB-NACL 420 G PO SOLR
4000.0000 mL | Freq: Once | ORAL | Status: AC
  Administered 2020-10-10: 4000 mL via ORAL
  Filled 2020-10-10: qty 4000

## 2020-10-10 MED ORDER — SODIUM CHLORIDE 0.9 % IV SOLN
300.0000 mg | Freq: Once | INTRAVENOUS | Status: AC
  Administered 2020-10-10: 300 mg via INTRAVENOUS
  Filled 2020-10-10: qty 15

## 2020-10-10 NOTE — Progress Notes (Signed)
PROGRESS NOTE    Krystal Goodwin  [id number] DOB: 24-Sep-1933 DOA: 10/08/2020 PCP: Derinda Late, MD    Chief Complaint  Patient presents with   Abnormal Lab    Brief Narrative:  Krystal Goodwin is a 87 y.o. female with medical history significant of dementia Diet controlled DMII, HTN, Aortic valve stenosis s/p aortic valve repair, asthma, colon ca s/p remote partial colectomy with last cscope in system noting no abnormality, IDA, Afib on Eliquis, HLD,Gout,HTN, Hypothyroidism, OSA noncompliant with cpap over the last 3 years who presents from NH due to low hgb  Subjective:  Krystal Goodwin is hard of hearing, appears has dementia but calm and cooperative Vital signs are stable, no overt bleed currently No wheezing, does have occasional dry cough, leg edema has improved, 600cc urine output documented, not sure is reliable Krystal Goodwin c/o right knee pain   Assessment & Plan:   Active Problems:   GI bleed  Blood loss anemia likely from GI loss with report of melena -CT angio abdomen pelvis was done in the ED no acute bleeding identified -Hemoglobin 4.7 on presentation, received 2 units PRBC transfusion, hemoglobin stable above 7 after transfusion -Continue iv PPI -Hold Eliquis -GI consulted plan to do EGD colonoscopy after Eliquis washout of 48 hours, will follow GI recommendation  AKI on CKD 3B Creatinine peaked at 1.6, Trending down Hold Cozaar Renal dosing medication Repeat BMP in the morning  PAF with h/o heart block s/p PPM Currently regular rate rhythm Hold home meds long-acting metoprolol, changed to Lopressor 12.5 mg twice daily with holding parameters Hold Eliquis due to GI bleed  Chronic diastolic CHF -On oral Lasix 20 mg daily at home -Krystal Goodwin does has mild edema lower extremity on exam -Krystal Goodwin received PRBC transfusion overnight, will give IV Lasix '60mg'$  x1 on 9/2 -Monitor volume status, continue iv lasix prn   Diet controlled type 2 diabetes A1c 5.5  Hyperlipidemia Continue  statin  Hypothyroidism Continue Synthroid  Dementia, failure to thrive Moved from assisted living to skilled nursing facility 65-monthago, has been having intermittent hallucination Per daughter report in the last few months, daughter is interested in meeting palliative care, palliative care consulted Risk of delirium in the hospital Daughter reported as needed Xanax works for Krystal Goodwin at home, ordered 0.25 mg prn  Bilateral knee pain, per daughter is chronic , reports patient does not walk due to chronic knee pain Knee x ray ordered   Nutritional Assessment: The patient's BMI is: Body mass index is 32.47 kg/m..Marland Kitchen    Unresulted Labs (From admission, onward)     Start     Ordered   10/11/20 0500  CBC  Daily,   R     Question:  Specimen collection method  Answer:  Lab=Lab collect   10/10/20 0822   10/11/20 [id number] Basic metabolic panel  Daily,   R     Question:  Specimen collection method  Answer:  Lab=Lab collect   10/10/20 0822   10/10/20 0824  Hemoglobin  5A & 5P,   TIMED     Question:  Specimen collection method  Answer:  Lab=Lab collect   10/10/20 0822   10/09/20 2057  MRSA Next Gen by PCR, Nasal  Once,   R        10/09/20 2057   10/09/20 1622  Urine Culture  Once,   STAT       Question:  Indication  Answer:  Dysuria   10/09/20 1621  DVT prophylaxis: SCDs Start: 10/08/20 2054   Code Status: Full, confirmed with daughter Family Communication: Daughter over the phone Disposition:   Status is: Inpatient  Dispo: The patient is from: SNF              Anticipated d/c is to: SNF              Anticipated d/c date is: Early next week                Consultants:  GI  Procedures:  Plan for EGD colonoscopy on 9/4  Antimicrobials:    Anti-infectives (From admission, onward)    Start     Dose/Rate Route Frequency Ordered Stop   10/09/20 1900  cefTRIAXone (ROCEPHIN) 1 g in sodium chloride 0.9 % 100 mL IVPB        1 g 200 mL/hr over 30 Minutes  Intravenous Every 24 hours 10/09/20 1813            Objective: Vitals:   10/10/20 0101 10/10/20 0547 10/10/20 0826 10/10/20 1158  BP: (!) 125/50 111/87 (!) 135/45 (!) 121/55  Pulse: 73 67 68 72  Resp:   18 18  Temp: 98.3 F (36.8 C) 98.1 F (36.7 C) (!) 97.5 F (36.4 C) 97.9 F (36.6 C)  TempSrc: Oral Oral    SpO2: 95% 100% 100% 100%  Weight:      Height:        Intake/Output Summary (Last 24 hours) at 10/10/2020 1214 Last data filed at 10/10/2020 0840 Gross per 24 hour  Intake 456.14 ml  Output 600 ml  Net -143.86 ml   Filed Weights   10/08/20 1752  Weight: 88.5 kg    Examination:  General exam: Frail elderly, baseline dementia Respiratory system: Clear to auscultation. Respiratory effort normal. Cardiovascular system: S1 & S2 heard, RRR. 2/6 precordial murmur, Gastrointestinal system: Abdomen is nondistended, soft and nontender.  Normal bowel sounds heard. Central nervous system: Alert and oriented to self, appear to have dementia, No focal neurological deficits. Extremities: able to lift both leg against gravity, bilateral lower extremity trace pitting edema, left > right Skin: No rashes, lesions or ulcers Psychiatry: dementia.     Data Reviewed: I have personally reviewed following labs and imaging studies  CBC: Recent Labs  Lab 10/08/20 1757 10/09/20 0019 10/09/20 0625 10/09/20 1100 10/09/20 1738 10/10/20 0835  WBC 7.1  --   --   --   --   --   HGB 4.7* 7.3* 7.6* 7.4* 7.5* 7.9*  HCT 16.9* 23.7* 25.1* 23.6* 23.8*  --   MCV 78.2*  --   --   --   --   --   PLT 203  --   --   --   --   --     Basic Metabolic Panel: Recent Labs  Lab 10/08/20 1757 10/09/20 0625 10/10/20 0449  NA 136 138 142  K 4.7 4.0 4.3  CL 110 110 113*  CO2 18* 22 24  GLUCOSE 94 95 143*  BUN 37* 34* 27*  CREATININE 1.60* 1.50* 1.34*  CALCIUM 8.8* 8.6* 8.7*  MG  --   --  2.0    GFR: Estimated Creatinine Clearance: 33.7 mL/min (A) (by C-G formula based on SCr of 1.34  mg/dL (H)).  Liver Function Tests: Recent Labs  Lab 10/08/20 1757 10/09/20 0625  AST 28 19  ALT 13 13  ALKPHOS 65 57  BILITOT 0.6 1.1  PROT 6.3* 6.0*  ALBUMIN 3.3* 2.9*  CBG: Recent Labs  Lab 10/09/20 0751 10/09/20 1820 10/10/20 0633 10/10/20 0828 10/10/20 1155  GLUCAP 95 94 135* 109* 151*     Recent Results (from the past 240 hour(s))  Resp Panel by RT-PCR (Flu A&B, Covid) Nasopharyngeal Swab     Status: None   Collection Time: 10/08/20  8:52 PM   Specimen: Nasopharyngeal Swab; Nasopharyngeal(NP) swabs in vial transport medium  Result Value Ref Range Status   SARS Coronavirus 2 by RT PCR NEGATIVE NEGATIVE Final    Comment: (NOTE) SARS-CoV-2 target nucleic acids are NOT DETECTED.  The SARS-CoV-2 RNA is generally detectable in upper respiratory specimens during the acute phase of infection. The lowest concentration of SARS-CoV-2 viral copies this assay can detect is 138 copies/mL. A negative result does not preclude SARS-Cov-2 infection and should not be used as the sole basis for treatment or other patient management decisions. A negative result may occur with  improper specimen collection/handling, submission of specimen other than nasopharyngeal swab, presence of viral mutation(s) within the areas targeted by this assay, and inadequate number of viral copies(<138 copies/mL). A negative result must be combined with clinical observations, patient history, and epidemiological information. The expected result is Negative.  Fact Sheet for Patients:  EntrepreneurPulse.com.au  Fact Sheet for Healthcare Providers:  IncredibleEmployment.be  This test is no t yet approved or cleared by the [place] FDA and  has been authorized for detection and/or diagnosis of SARS-CoV-2 by FDA under an Emergency Use Authorization (EUA). This EUA will remain  in effect (meaning this test can be used) for the duration of the COVID-19  declaration under Section 564(b)(1) of the Act, 21 U.S.C.section 360bbb-3(b)(1), unless the authorization is terminated  or revoked sooner.       Influenza A by PCR NEGATIVE NEGATIVE Final   Influenza B by PCR NEGATIVE NEGATIVE Final    Comment: (NOTE) The Xpert Xpress SARS-CoV-2/FLU/RSV plus assay is intended as an aid in the diagnosis of influenza from Nasopharyngeal swab specimens and should not be used as a sole basis for treatment. Nasal washings and aspirates are unacceptable for Xpert Xpress SARS-CoV-2/FLU/RSV testing.  Fact Sheet for Patients: EntrepreneurPulse.com.au  Fact Sheet for Healthcare Providers: IncredibleEmployment.be  This test is not yet approved or cleared by the [place] FDA and has been authorized for detection and/or diagnosis of SARS-CoV-2 by FDA under an Emergency Use Authorization (EUA). This EUA will remain in effect (meaning this test can be used) for the duration of the COVID-19 declaration under Section 564(b)(1) of the Act, 21 U.S.C. section 360bbb-3(b)(1), unless the authorization is terminated or revoked.  Performed at [facility], [address], [place], [place] [address]          Radiology Studies: DG Chest [place] 1 View  Result Date: 10/09/2020 CLINICAL DATA:  Shortness of breath EXAM: PORTABLE CHEST 1 VIEW COMPARISON:  Radiograph 09/04/2019 FINDINGS: Unchanged cardiomediastinal silhouette with prior median sternotomy, aortic valve replacement, and dual chamber pacemaker leads. There is no new focal airspace disease. There is no large pleural effusion or visible pneumothorax. No acute osseous abnormality. Unchanged appearance of the right glenoid. Right upper quadrant surgical clips, likely prior cholecystectomy. Unchanged calcification overlying the right axilla. IMPRESSION: No evidence of acute cardiopulmonary disease. Electronically Signed   By: Maurine Simmering M.D.   On: 10/09/2020 15:07    ECHOCARDIOGRAM COMPLETE  Result Date: 10/09/2020    ECHOCARDIOGRAM REPORT   Patient Name:   DORTHULA LISLE Date of Exam: 10/09/2020 Medical Rec #:  [id number]    Height:       65.0 in Accession #:    [id number]   Weight:       195.1 lb Date of Birth:  11/17/1933     BSA:          1.957 m Patient Age:    29 years     BP:           128/46 mmHg Patient Gender: F            HR:           78 bpm. Exam Location:  [facility] Procedure: 2D Echo, Color Doppler and Cardiac Doppler Indications:     I50.9 Congestive Heart Failure  History:         Patient has no prior history of Echocardiogram examinations.                  Aortic Valve Disease; Risk Factors:Diabetes, Dyslipidemia,                  Hypertension and Sleep Apnea. Asthma.  Sonographer:     Charmayne Sheer Referring Phys:  [id number] [facility] A THOMAS Diagnosing Phys: Neoma Laming  Sonographer Comments: Suboptimal parasternal window and no subcostal window. IMPRESSIONS  1. Left ventricular ejection fraction, by estimation, is 50 to 55%. The left ventricle has low normal function. The left ventricle has no regional wall motion abnormalities. There is mild concentric left ventricular hypertrophy. Left ventricular diastolic parameters are consistent with Grade II diastolic dysfunction (pseudonormalization).  2. Right ventricular systolic function is mildly reduced. The right ventricular size is mildly enlarged.  3. Left atrial size was mildly dilated.  4. Right atrial size was mildly dilated.  5. The mitral valve is degenerative. Mild mitral valve regurgitation. No evidence of mitral stenosis. Moderate mitral annular calcification.  6. The aortic valve is calcified. Aortic valve regurgitation is trivial. Mild aortic valve sclerosis is present, with no evidence of aortic valve stenosis.  7. The inferior vena cava is normal in size with greater than 50% respiratory variability, suggesting right atrial pressure of 3 mmHg. FINDINGS  Left Ventricle: Left ventricular ejection fraction,  by estimation, is 50 to 55%. The left ventricle has low normal function. The left ventricle has no regional wall motion abnormalities. The left ventricular internal cavity size was normal in size. There is mild concentric left ventricular hypertrophy. Left ventricular diastolic parameters are consistent with Grade II diastolic dysfunction (pseudonormalization). Right Ventricle: The right ventricular size is mildly enlarged. No increase in right ventricular wall thickness. Right ventricular systolic function is mildly reduced. Left Atrium: Left atrial size was mildly dilated. Right Atrium: Right atrial size was mildly dilated. Pericardium: There is no evidence of pericardial effusion. Mitral Valve: The mitral valve is degenerative in appearance. Moderate mitral annular calcification. Mild mitral valve regurgitation. No evidence of mitral valve stenosis. MV peak gradient, 9.1 mmHg. The mean mitral valve gradient is 4.0 mmHg. Tricuspid Valve: The tricuspid valve is normal in structure. Tricuspid valve regurgitation is not demonstrated. No evidence of tricuspid stenosis. Aortic Valve: The aortic valve is calcified. Aortic valve regurgitation is trivial. Mild aortic valve sclerosis is present, with no evidence of aortic valve stenosis. Aortic valve mean gradient measures 16.0 mmHg. Aortic valve peak gradient measures 28.3  mmHg. Aortic valve area, by VTI measures 1.64 cm. Pulmonic Valve: The pulmonic valve was normal in structure. Pulmonic valve regurgitation is not visualized. No evidence of pulmonic stenosis. Aorta: The aortic root is normal  in size and structure. Venous: The inferior vena cava is normal in size with greater than 50% respiratory variability, suggesting right atrial pressure of 3 mmHg. IAS/Shunts: No atrial level shunt detected by color flow Doppler.  LEFT VENTRICLE PLAX 2D LVIDd:         4.26 cm  Diastology LVIDs:         3.08 cm  LV e' medial:    5.77 cm/s LV PW:         0.98 cm  LV E/e' medial:   27.0 LV IVS:        0.68 cm  LV e' lateral:   10.20 cm/s LVOT diam:     1.90 cm  LV E/e' lateral: 15.3 LV SV:         96 LV SV Index:   49 LVOT Area:     2.84 cm  LEFT ATRIUM             Index LA diam:        4.30 cm 2.20 cm/m LA Vol (A2C):   26.4 ml 13.49 ml/m LA Vol (A4C):   42.9 ml 21.92 ml/m LA Biplane Vol: 36.9 ml 18.85 ml/m  AORTIC VALVE                    PULMONIC VALVE AV Area (Vmax):    1.47 cm     PV Vmax:       0.70 m/s AV Area (Vmean):   1.44 cm     PV Vmean:      48.800 cm/s AV Area (VTI):     1.64 cm     PV VTI:        0.118 m AV Vmax:           266.00 cm/s  PV Peak grad:  1.9 mmHg AV Vmean:          186.500 cm/s PV Mean grad:  1.0 mmHg AV VTI:            0.583 m AV Peak Grad:      28.3 mmHg AV Mean Grad:      16.0 mmHg LVOT Vmax:         138.00 cm/s LVOT Vmean:        94.700 cm/s LVOT VTI:          0.338 m LVOT/AV VTI ratio: 0.58  AORTA Ao Root diam: 2.10 cm MITRAL VALVE MV Area (PHT): 4.46 cm     SHUNTS MV Area VTI:   2.16 cm     Systemic VTI:  0.34 m MV Peak grad:  9.1 mmHg     Systemic Diam: 1.90 cm MV Mean grad:  4.0 mmHg MV Vmax:       1.51 m/s MV Vmean:      91.9 cm/s MV Decel Time: 170 msec MV E velocity: 156.00 cm/s MV A velocity: 113.00 cm/s MV E/A ratio:  1.38 Shaukat Khan Electronically signed by Neoma Laming Signature Date/Time: 10/09/2020/3:08:16 PM    Final    CT Angio Abd/Pel W and/or Wo Contrast  Result Date: 10/08/2020 CLINICAL DATA:  Abnormal labs with low hemoglobin level. EXAM: CTA ABDOMEN AND PELVIS WITHOUT AND WITH CONTRAST TECHNIQUE: Multidetector CT imaging of the abdomen and pelvis was performed using the standard protocol during bolus administration of intravenous contrast. Multiplanar reconstructed images and MIPs were obtained and reviewed to evaluate the vascular anatomy. CONTRAST:  39m OMNIPAQUE IOHEXOL 350 MG/ML SOLN COMPARISON:  August 02, 2013 FINDINGS: VASCULAR  Aorta: Marked severity calcification and atherosclerosis without aneurysm, dissection, vasculitis  or significant stenosis. Celiac: Marked severity calcification and atherosclerosis without evidence of aneurysm, dissection, vasculitis or significant stenosis. SMA: Marked severity calcification and atherosclerosis without evidence of aneurysm, dissection, vasculitis or significant stenosis. Renals: Marked severity calcification and atherosclerosis without evidence of aneurysm, dissection, vasculitis, fibromuscular dysplasia or significant stenosis. IMA: Patent without evidence of aneurysm, dissection, vasculitis or significant stenosis. Inflow: Marked severity calcification and atherosclerosis without evidence of aneurysm, dissection, vasculitis or significant stenosis. Proximal Outflow: Bilateral common femoral and visualized portions of the superficial and profunda femoral arteries are patent without evidence of aneurysm, dissection, vasculitis or significant stenosis. Veins: No obvious venous abnormality within the limitations of this arterial phase study. Review of the MIP images confirms the above findings. NON-VASCULAR Lower chest: No acute abnormality. Hepatobiliary: There is diffuse fatty infiltration of the liver parenchyma. No focal liver abnormality is seen. Status post cholecystectomy. No biliary dilatation. Pancreas: Unremarkable. No pancreatic ductal dilatation or surrounding inflammatory changes. Spleen: Normal in size without focal abnormality. Adrenals/Urinary Tract: Adrenal glands are unremarkable. Kidneys are normal, without renal calculi, focal lesion, or hydronephrosis. Bladder is unremarkable. Stomach/Bowel: There is a large hiatal hernia. Postoperative changes seen consistent with prior right hemicolectomy. No evidence of bowel wall thickening, distention, or inflammatory changes. Lymphatic: No abnormal abdominal or pelvic lymph nodes are identified. Reproductive: Uterus and bilateral adnexa are unremarkable. Other: No abdominal wall hernia or abnormality. No abdominopelvic ascites.  Musculoskeletal: A total left hip replacement is seen with associated streak artifact and subsequently limited evaluation of the adjacent osseous and soft tissue structures. Degenerative changes are noted throughout the lumbar spine. IMPRESSION: 1. Marked severity atherosclerotic disease without evidence of aneurysm or dissection. 2. Large hiatal hernia. 3. Hepatic steatosis. 4. Status post cholecystectomy. 5. Total left hip replacement. Aortic Atherosclerosis (ICD10-I70.0). Electronically Signed   By: Virgina Norfolk M.D.   On: 10/08/2020 19:41        Scheduled Meds:  diclofenac Sodium  4 g Topical QID   levothyroxine  100 mcg Oral Q0600   metoprolol tartrate  12.5 mg Oral BID   [START ON 10/12/2020] pantoprazole  40 mg Intravenous Q12H   polyethylene glycol  17 g Oral Daily   sodium chloride flush  3 mL Intravenous Q12H   Continuous Infusions:  sodium chloride 10 mL/hr at 10/10/20 1147   cefTRIAXone (ROCEPHIN)  IV Stopped (10/10/20 0116)   iron sucrose 300 mg (10/10/20 1149)   pantoprazole 8 mg/hr (10/10/20 0838)     LOS: 2 days   Time spent: 70mns Greater than 50% of this time was spent in counseling, explanation of diagnosis, planning of further management, and coordination of care.   Voice Recognition /Viviann Sparedictation system was used to create this note, attempts have been made to correct errors. Please contact the author with questions and/or clarifications.   FFlorencia Reasons MD PhD FACP [facility]  Available via Epic secure chat 7am-7pm for nonurgent issues Please page for urgent issues To page the attending provider between 7A-7P or the covering provider during after hours 7P-7A, please log into the web site [email or web address] and access using universal [place] password for that web site. If you do not have the password, please call the hospital operator.    10/10/2020, 12:14 PM

## 2020-10-10 NOTE — Plan of Care (Signed)

## 2020-10-10 NOTE — Anesthesia Preprocedure Evaluation (Addendum)
Anesthesia Evaluation   Patient awake and Patient confused  General Assessment Comment:Mildly confused, daughter gives history and consents for procedures.   Reviewed: Allergy & Precautions, H&P , NPO status , reviewed documented beta blocker date and time   History of Anesthesia Complications (+) PONV and history of anesthetic complications  Airway Mallampati: III   Neck ROM: Limited    Dental  (+) Teeth Intact   Pulmonary shortness of breath, asthma , sleep apnea (non-compliant with cpap) , former smoker,    Pulmonary exam normal breath sounds clear to auscultation       Cardiovascular hypertension, Normal cardiovascular exam+ dysrhythmias Atrial Fibrillation + pacemaker + Valvular Problems/Murmurs  Rhythm:Regular Rate:Normal  10/2020 echo 1. Left ventricular ejection fraction, by estimation, is 50 to 55%. The  left ventricle has low normal function. The left ventricle has no regional  wall motion abnormalities. There is mild concentric left ventricular  hypertrophy. Left ventricular  diastolic parameters are consistent with Grade II diastolic dysfunction  (pseudonormalization).  2. Right ventricular systolic function is mildly reduced. The right  ventricular size is mildly enlarged.  3. Left atrial size was mildly dilated.  4. Right atrial size was mildly dilated.  5. The mitral valve is degenerative. Mild mitral valve regurgitation. No  evidence of mitral stenosis. Moderate mitral annular calcification.  6. The aortic valve is calcified. Aortic valve regurgitation is trivial.  Mild aortic valve sclerosis is present, with no evidence of aortic valve  stenosis.  7. The inferior vena cava is normal in size with greater than 50%  respiratory variability, suggesting right atrial pressure of 3 mmHg.    AVR and PPM 2015   Neuro/Psych PSYCHIATRIC DISORDERS Dementia    GI/Hepatic neg GERD  ,H/o colon ca, s/p partial  colectomy.    Endo/Other  diabetesHypothyroidism   Renal/GU ARFRenal disease     Musculoskeletal  (+) Arthritis ,   Abdominal   Peds  Hematology  (+) Blood dyscrasia, anemia ,   Anesthesia Other Findings Past Medical History: No date: Anemia     Comment:  iron def.anemia No date: Aortic stenosis, severe No date: Asthma No date: Chest pain No date: Colon cancer ([place]) No date: Diabetes mellitus without complication (HCC) No date: Dyslipidemia No date: Dyspnea No date: Gout, joint No date: H/O postmenopausal osteoporosis No date: Hematuria No date: History of palpitations No date: Hx of colonic polyps No date: Hypertension No date: Hypothyroidism No date: IBS (irritable bowel syndrome) No date: Schatzki's ring No date: Sleep apnea  Past Surgical History: No date: AORTIC VALVE REPLACEMENT No date: CARDIAC PACEMAKER PLACEMENT No date: CHOLECYSTECTOMY 1997: COLON SURGERY     Comment:  RIGHT HEMICOLECTOMY No date: COLONOSCOPY 09/07/2016: COLONOSCOPY WITH PROPOFOL; N/A     Comment:  Procedure: COLONOSCOPY WITH PROPOFOL;  Surgeon: Manya Silvas, MD;  Location: [facility] ENDOSCOPY;  Service:               Endoscopy;  Laterality: N/A; No date: ESOPHAGOGASTRODUODENOSCOPY 03/08/2013: TEE WITHOUT CARDIOVERSION  BMI    Body Mass Index: 33.28 kg/m      Reproductive/Obstetrics                         Anesthesia Physical  Anesthesia Plan  ASA: 4  Anesthesia Plan: MAC   Post-op Pain Management:    Induction: Intravenous  PONV Risk Score and Plan:   Airway Management Planned: Simple  Face Mask  Additional Equipment:   Intra-op Plan:   Post-operative Plan:   Informed Consent: I have reviewed the patients History and Physical, chart, labs and discussed the procedure including the risks, benefits and alternatives for the proposed anesthesia with the patient or authorized representative who has indicated his/her understanding and  acceptance.     Consent reviewed with POA  Plan Discussed with: CRNA  Anesthesia Plan Comments:        Anesthesia Quick Evaluation

## 2020-10-10 NOTE — Progress Notes (Signed)
Krystal Darby, MD [address]  [place]  [place], [place] [address]  Main: [phone]  Fax: [phone] Pager: [phone]   Subjective: No acute events overnight.  Per patient's nurse she had a large solid bowel movement which was clay colored this morning.  Patient is complaining of knee pain.  Currently on full liquid diet   Objective: Vital signs in last 24 hours: Vitals:   10/09/20 1930 10/10/20 0101 10/10/20 0547 10/10/20 0826  BP: (!) 142/53 (!) 125/50 111/87 (!) 135/45  Pulse: 82 73 67 68  Resp: (!) 22   18  Temp:  98.3 F (36.8 C) 98.1 F (36.7 C) (!) 97.5 F (36.4 C)  TempSrc:  Oral Oral   SpO2: 99% 95% 100% 100%  Weight:      Height:       Weight change:   Intake/Output Summary (Last 24 hours) at 10/10/2020 1047 Last data filed at 10/10/2020 0840 Gross per 24 hour  Intake 456.14 ml  Output 600 ml  Net -143.86 ml     Exam: Heart:: Regular rate and rhythm or S1S2 present Lungs: normal and clear to auscultation Abdomen: soft, nontender, normal bowel sounds   Lab Results: CBC Latest Ref Rng & Units 10/10/2020 10/09/2020 10/09/2020  WBC 4.0 - 10.5 K/uL - - -  Hemoglobin 12.0 - 15.0 g/dL 7.9(L) 7.5(L) 7.4(L)  Hematocrit 36.0 - 46.0 % - 23.8(L) 23.6(L)  Platelets 150 - 400 K/uL - - -   CMP Latest Ref Rng & Units 10/10/2020 10/09/2020 10/08/2020  Glucose 70 - 99 mg/dL 143(H) 95 94  BUN 8 - 23 mg/dL 27(H) 34(H) 37(H)  Creatinine 0.44 - 1.00 mg/dL 1.34(H) 1.50(H) 1.60(H)  Sodium 135 - 145 mmol/L 142 138 136  Potassium 3.5 - 5.1 mmol/L 4.3 4.0 4.7  Chloride 98 - 111 mmol/L 113(H) 110 110  CO2 22 - 32 mmol/L 24 22 18(L)  Calcium 8.9 - 10.3 mg/dL 8.7(L) 8.6(L) 8.8(L)  Total Protein 6.5 - 8.1 g/dL - 6.0(L) 6.3(L)  Total Bilirubin 0.3 - 1.2 mg/dL - 1.1 0.6  Alkaline Phos 38 - 126 U/L - 57 65  AST 15 - 41 U/L - 19 28  ALT 0 - 44 U/L - 13 13    Micro Results: Recent Results (from the past 240 hour(s))  Resp Panel by RT-PCR (Flu A&B, Covid)  Nasopharyngeal Swab     Status: None   Collection Time: 10/08/20  8:52 PM   Specimen: Nasopharyngeal Swab; Nasopharyngeal(NP) swabs in vial transport medium  Result Value Ref Range Status   SARS Coronavirus 2 by RT PCR NEGATIVE NEGATIVE Final    Comment: (NOTE) SARS-CoV-2 target nucleic acids are NOT DETECTED.  The SARS-CoV-2 RNA is generally detectable in upper respiratory specimens during the acute phase of infection. The lowest concentration of SARS-CoV-2 viral copies this assay can detect is 138 copies/mL. A negative result does not preclude SARS-Cov-2 infection and should not be used as the sole basis for treatment or other patient management decisions. A negative result may occur with  improper specimen collection/handling, submission of specimen other than nasopharyngeal swab, presence of viral mutation(s) within the areas targeted by this assay, and inadequate number of viral copies(<138 copies/mL). A negative result must be combined with clinical observations, patient history, and epidemiological information. The expected result is Negative.  Fact Sheet for Patients:  EntrepreneurPulse.com.au  Fact Sheet for Healthcare Providers:  IncredibleEmployment.be  This test is no t yet approved or cleared by the [place]  FDA and  has been authorized for detection and/or diagnosis of SARS-CoV-2 by FDA under an Emergency Use Authorization (EUA). This EUA will remain  in effect (meaning this test can be used) for the duration of the COVID-19 declaration under Section 564(b)(1) of the Act, 21 U.S.C.section 360bbb-3(b)(1), unless the authorization is terminated  or revoked sooner.       Influenza A by PCR NEGATIVE NEGATIVE Final   Influenza B by PCR NEGATIVE NEGATIVE Final    Comment: (NOTE) The Xpert Xpress SARS-CoV-2/FLU/RSV plus assay is intended as an aid in the diagnosis of influenza from Nasopharyngeal swab specimens and should not be  used as a sole basis for treatment. Nasal washings and aspirates are unacceptable for Xpert Xpress SARS-CoV-2/FLU/RSV testing.  Fact Sheet for Patients: EntrepreneurPulse.com.au  Fact Sheet for Healthcare Providers: IncredibleEmployment.be  This test is not yet approved or cleared by the [place] FDA and has been authorized for detection and/or diagnosis of SARS-CoV-2 by FDA under an Emergency Use Authorization (EUA). This EUA will remain in effect (meaning this test can be used) for the duration of the COVID-19 declaration under Section 564(b)(1) of the Act, 21 U.S.C. section 360bbb-3(b)(1), unless the authorization is terminated or revoked.  Performed at [facility], [address] [place], [place] [address]    Studies/Results: DG Chest [place] 1 View  Result Date: 10/09/2020 CLINICAL DATA:  Shortness of breath EXAM: PORTABLE CHEST 1 VIEW COMPARISON:  Radiograph 09/04/2019 FINDINGS: Unchanged cardiomediastinal silhouette with prior median sternotomy, aortic valve replacement, and dual chamber pacemaker leads. There is no new focal airspace disease. There is no large pleural effusion or visible pneumothorax. No acute osseous abnormality. Unchanged appearance of the right glenoid. Right upper quadrant surgical clips, likely prior cholecystectomy. Unchanged calcification overlying the right axilla. IMPRESSION: No evidence of acute cardiopulmonary disease. Electronically Signed   By: Maurine Simmering M.D.   On: 10/09/2020 15:07   ECHOCARDIOGRAM COMPLETE  Result Date: 10/09/2020    ECHOCARDIOGRAM REPORT   Patient Name:   Krystal Goodwin Date of Exam: 10/09/2020 Medical Rec #:  [id number]    Height:       65.0 in Accession #:    [id number]   Weight:       195.1 lb Date of Birth:  15-Nov-1933     BSA:          1.957 m Patient Age:    87 years     BP:           128/46 mmHg Patient Gender: F            HR:           78 bpm. Exam Location:  [facility] Procedure: 2D  Echo, Color Doppler and Cardiac Doppler Indications:     I50.9 Congestive Heart Failure  History:         Patient has no prior history of Echocardiogram examinations.                  Aortic Valve Disease; Risk Factors:Diabetes, Dyslipidemia,                  Hypertension and Sleep Apnea. Asthma.  Sonographer:     Charmayne Sheer Referring Phys:  [id number] [facility] A THOMAS Diagnosing Phys: Neoma Laming  Sonographer Comments: Suboptimal parasternal window and no subcostal window. IMPRESSIONS  1. Left ventricular ejection fraction, by estimation, is 50 to 55%. The left ventricle has low normal function. The left ventricle has no regional wall  motion abnormalities. There is mild concentric left ventricular hypertrophy. Left ventricular diastolic parameters are consistent with Grade II diastolic dysfunction (pseudonormalization).  2. Right ventricular systolic function is mildly reduced. The right ventricular size is mildly enlarged.  3. Left atrial size was mildly dilated.  4. Right atrial size was mildly dilated.  5. The mitral valve is degenerative. Mild mitral valve regurgitation. No evidence of mitral stenosis. Moderate mitral annular calcification.  6. The aortic valve is calcified. Aortic valve regurgitation is trivial. Mild aortic valve sclerosis is present, with no evidence of aortic valve stenosis.  7. The inferior vena cava is normal in size with greater than 50% respiratory variability, suggesting right atrial pressure of 3 mmHg. FINDINGS  Left Ventricle: Left ventricular ejection fraction, by estimation, is 50 to 55%. The left ventricle has low normal function. The left ventricle has no regional wall motion abnormalities. The left ventricular internal cavity size was normal in size. There is mild concentric left ventricular hypertrophy. Left ventricular diastolic parameters are consistent with Grade II diastolic dysfunction (pseudonormalization). Right Ventricle: The right ventricular size is mildly enlarged. No  increase in right ventricular wall thickness. Right ventricular systolic function is mildly reduced. Left Atrium: Left atrial size was mildly dilated. Right Atrium: Right atrial size was mildly dilated. Pericardium: There is no evidence of pericardial effusion. Mitral Valve: The mitral valve is degenerative in appearance. Moderate mitral annular calcification. Mild mitral valve regurgitation. No evidence of mitral valve stenosis. MV peak gradient, 9.1 mmHg. The mean mitral valve gradient is 4.0 mmHg. Tricuspid Valve: The tricuspid valve is normal in structure. Tricuspid valve regurgitation is not demonstrated. No evidence of tricuspid stenosis. Aortic Valve: The aortic valve is calcified. Aortic valve regurgitation is trivial. Mild aortic valve sclerosis is present, with no evidence of aortic valve stenosis. Aortic valve mean gradient measures 16.0 mmHg. Aortic valve peak gradient measures 28.3  mmHg. Aortic valve area, by VTI measures 1.64 cm. Pulmonic Valve: The pulmonic valve was normal in structure. Pulmonic valve regurgitation is not visualized. No evidence of pulmonic stenosis. Aorta: The aortic root is normal in size and structure. Venous: The inferior vena cava is normal in size with greater than 50% respiratory variability, suggesting right atrial pressure of 3 mmHg. IAS/Shunts: No atrial level shunt detected by color flow Doppler.  LEFT VENTRICLE PLAX 2D LVIDd:         4.26 cm  Diastology LVIDs:         3.08 cm  LV e' medial:    5.77 cm/s LV PW:         0.98 cm  LV E/e' medial:  27.0 LV IVS:        0.68 cm  LV e' lateral:   10.20 cm/s LVOT diam:     1.90 cm  LV E/e' lateral: 15.3 LV SV:         96 LV SV Index:   49 LVOT Area:     2.84 cm  LEFT ATRIUM             Index LA diam:        4.30 cm 2.20 cm/m LA Vol (A2C):   26.4 ml 13.49 ml/m LA Vol (A4C):   42.9 ml 21.92 ml/m LA Biplane Vol: 36.9 ml 18.85 ml/m  AORTIC VALVE                    PULMONIC VALVE AV Area (Vmax):    1.47 cm     PV Vmax:  0.70 m/s AV Area (Vmean):   1.44 cm     PV Vmean:      48.800 cm/s AV Area (VTI):     1.64 cm     PV VTI:        0.118 m AV Vmax:           266.00 cm/s  PV Peak grad:  1.9 mmHg AV Vmean:          186.500 cm/s PV Mean grad:  1.0 mmHg AV VTI:            0.583 m AV Peak Grad:      28.3 mmHg AV Mean Grad:      16.0 mmHg LVOT Vmax:         138.00 cm/s LVOT Vmean:        94.700 cm/s LVOT VTI:          0.338 m LVOT/AV VTI ratio: 0.58  AORTA Ao Root diam: 2.10 cm MITRAL VALVE MV Area (PHT): 4.46 cm     SHUNTS MV Area VTI:   2.16 cm     Systemic VTI:  0.34 m MV Peak grad:  9.1 mmHg     Systemic Diam: 1.90 cm MV Mean grad:  4.0 mmHg MV Vmax:       1.51 m/s MV Vmean:      91.9 cm/s MV Decel Time: 170 msec MV E velocity: 156.00 cm/s MV A velocity: 113.00 cm/s MV E/A ratio:  1.38 Shaukat Khan Electronically signed by Neoma Laming Signature Date/Time: 10/09/2020/3:08:16 PM    Final    CT Angio Abd/Pel W and/or Wo Contrast  Result Date: 10/08/2020 CLINICAL DATA:  Abnormal labs with low hemoglobin level. EXAM: CTA ABDOMEN AND PELVIS WITHOUT AND WITH CONTRAST TECHNIQUE: Multidetector CT imaging of the abdomen and pelvis was performed using the standard protocol during bolus administration of intravenous contrast. Multiplanar reconstructed images and MIPs were obtained and reviewed to evaluate the vascular anatomy. CONTRAST:  109m OMNIPAQUE IOHEXOL 350 MG/ML SOLN COMPARISON:  August 02, 2013 FINDINGS: VASCULAR Aorta: Marked severity calcification and atherosclerosis without aneurysm, dissection, vasculitis or significant stenosis. Celiac: Marked severity calcification and atherosclerosis without evidence of aneurysm, dissection, vasculitis or significant stenosis. SMA: Marked severity calcification and atherosclerosis without evidence of aneurysm, dissection, vasculitis or significant stenosis. Renals: Marked severity calcification and atherosclerosis without evidence of aneurysm, dissection, vasculitis, fibromuscular dysplasia or  significant stenosis. IMA: Patent without evidence of aneurysm, dissection, vasculitis or significant stenosis. Inflow: Marked severity calcification and atherosclerosis without evidence of aneurysm, dissection, vasculitis or significant stenosis. Proximal Outflow: Bilateral common femoral and visualized portions of the superficial and profunda femoral arteries are patent without evidence of aneurysm, dissection, vasculitis or significant stenosis. Veins: No obvious venous abnormality within the limitations of this arterial phase study. Review of the MIP images confirms the above findings. NON-VASCULAR Lower chest: No acute abnormality. Hepatobiliary: There is diffuse fatty infiltration of the liver parenchyma. No focal liver abnormality is seen. Status post cholecystectomy. No biliary dilatation. Pancreas: Unremarkable. No pancreatic ductal dilatation or surrounding inflammatory changes. Spleen: Normal in size without focal abnormality. Adrenals/Urinary Tract: Adrenal glands are unremarkable. Kidneys are normal, without renal calculi, focal lesion, or hydronephrosis. Bladder is unremarkable. Stomach/Bowel: There is a large hiatal hernia. Postoperative changes seen consistent with prior right hemicolectomy. No evidence of bowel wall thickening, distention, or inflammatory changes. Lymphatic: No abnormal abdominal or pelvic lymph nodes are identified. Reproductive: Uterus and bilateral adnexa are unremarkable. Other: No abdominal wall hernia or abnormality.  No abdominopelvic ascites. Musculoskeletal: A total left hip replacement is seen with associated streak artifact and subsequently limited evaluation of the adjacent osseous and soft tissue structures. Degenerative changes are noted throughout the lumbar spine. IMPRESSION: 1. Marked severity atherosclerotic disease without evidence of aneurysm or dissection. 2. Large hiatal hernia. 3. Hepatic steatosis. 4. Status post cholecystectomy. 5. Total left hip replacement.  Aortic Atherosclerosis (ICD10-I70.0). Electronically Signed   By: Virgina Norfolk M.D.   On: 10/08/2020 19:41   Medications: I have reviewed the patient's current medications. Prior to Admission:  Medications Prior to Admission  Medication Sig Dispense Refill Last Dose   acetaminophen (TYLENOL) 650 MG CR tablet Take 650 mg by mouth 3 (three) times daily.   10/08/2020 at 1400   atorvastatin (LIPITOR) 10 MG tablet Take 10 mg by mouth daily.   10/08/2020 at 0800   cetirizine (ZYRTEC) 10 MG tablet Take 10 mg by mouth daily.   10/08/2020 at 0800   Cholecalciferol (VITAMIN D3) 50 MCG (2000 UT) TABS Take 2,000 Units by mouth daily at 12 noon.   10/08/2020 at 0800   citalopram (CELEXA) 20 MG tablet Take 20 mg by mouth daily.   10/08/2020 at 0800   diclofenac Sodium (VOLTAREN) 1 % GEL Apply 4 g topically 4 (four) times daily.   10/08/2020 at 1200   ELIQUIS 5 MG TABS tablet Take 5 mg by mouth 2 (two) times daily.   10/08/2020 at 0800   furosemide (LASIX) 20 MG tablet Take 20 mg by mouth daily.   10/08/2020   HYDROcodone-acetaminophen (NORCO) 7.5-325 MG tablet Take 1 tablet by mouth 3 (three) times daily.   10/08/2020 at 1400   levothyroxine (SYNTHROID) 100 MCG tablet Take 100 mcg by mouth daily before breakfast.    10/07/2020 at 2000   melatonin 5 MG TABS Take 5 mg by mouth at bedtime.   10/07/2020 at 2000   metoprolol succinate (TOPROL-XL) 25 MG 24 hr tablet Take 25 mg by mouth daily.   10/08/2020 at 0800   Multiple Vitamin (MULTIVITAMIN) tablet Take 1 tablet by mouth daily.   10/07/2020 at 2000   senna-docusate (SENOKOT-S) 8.6-50 MG tablet Take 2 tablets by mouth at bedtime.   10/07/2020 at 2000   ALPRAZolam (XANAX) 0.25 MG tablet Take 1-2 tablets (0.25-0.5 mg total) by mouth every 4 (four) hours as needed for anxiety or sleep. (Patient not taking: No sig reported) 5 tablet 0 Not Taking   ferrous sulfate 325 (65 FE) MG tablet Take 325 mg by mouth daily with breakfast. (Patient not taking: No sig reported)   Not Taking    HYDROcodone-acetaminophen (NORCO/VICODIN) 5-325 MG tablet Take 1 tablet by mouth every 4 (four) hours as needed for moderate pain. (Patient not taking: No sig reported) 42 tablet 0 Not Taking   losartan (COZAAR) 25 MG tablet Take 1 tablet (25 mg total) by mouth daily. (Patient not taking: No sig reported) 30 tablet 0 Not Taking   Melatonin-Pyridoxine 1-10 MG TABS Take by mouth at bedtime as needed. (Patient not taking: No sig reported)   Not Taking   pantoprazole (PROTONIX) 40 MG tablet Take 40 mg by mouth daily. (Patient not taking: No sig reported)   Not Taking   traMADol (ULTRAM) 50 MG tablet Take 1 tablet (50 mg total) by mouth every 6 (six) hours as needed for moderate pain. (Patient not taking: No sig reported) 30 tablet 0 Not Taking   Scheduled:  diclofenac Sodium  4 g Topical QID   levothyroxine  100 mcg Oral Q0600   metoprolol tartrate  12.5 mg Oral BID   [START ON 10/12/2020] pantoprazole  40 mg Intravenous Q12H   polyethylene glycol  17 g Oral Daily   polyethylene glycol-electrolytes  4,000 mL Oral Once   sodium chloride flush  3 mL Intravenous Q12H   Continuous:  sodium chloride     cefTRIAXone (ROCEPHIN)  IV Stopped (10/10/20 0116)   iron sucrose     pantoprazole 8 mg/hr (10/10/20 0838)   [id number], levalbuterol, morphine injection Anti-infectives (From admission, onward)    Start     Dose/Rate Route Frequency Ordered Stop   10/09/20 1900  cefTRIAXone (ROCEPHIN) 1 g in sodium chloride 0.9 % 100 mL IVPB        1 g 200 mL/hr over 30 Minutes Intravenous Every 24 hours 10/09/20 1813        Scheduled Meds:  diclofenac Sodium  4 g Topical QID   levothyroxine  100 mcg Oral Q0600   metoprolol tartrate  12.5 mg Oral BID   [START ON 10/12/2020] pantoprazole  40 mg Intravenous Q12H   polyethylene glycol  17 g Oral Daily   polyethylene glycol-electrolytes  4,000 mL Oral Once   sodium chloride flush  3 mL Intravenous Q12H   Continuous Infusions:  sodium chloride      cefTRIAXone (ROCEPHIN)  IV Stopped (10/10/20 0116)   iron sucrose     pantoprazole 8 mg/hr (10/10/20 0838)   PRN Meds:.ALPRAZolam, levalbuterol, morphine injection   Assessment: Active Problems:   GI bleed  Plan: Severe iron deficiency anemia with no evidence of acute blood loss Venofer ordered Hemoglobin is stable Continue IV PPI Start clear liquid diet Eliquis is on hold since admission, day 2 Tentative plan for upper endoscopy and colonoscopy tomorrow if patient has clear bowel movements by tomorrow AM, bowel prep ordered N.p.o. effective 5 AM   LOS: 2 days   Nikita Humble 10/10/2020, 10:47 AM

## 2020-10-11 ENCOUNTER — Encounter
Admission: EM | Disposition: A | Discharge status: Skilled Nursing Facility | Payer: Self-pay | Source: Skilled Nursing Facility | Attending: Internal Medicine

## 2020-10-11 ENCOUNTER — Inpatient Hospital Stay: Payer: Medicare PPO | Admitting: Anesthesiology

## 2020-10-11 ENCOUNTER — Encounter: Payer: Self-pay | Admitting: Internal Medicine

## 2020-10-11 DIAGNOSIS — D509 Iron deficiency anemia, unspecified: Secondary | ICD-10-CM

## 2020-10-11 HISTORY — PX: GIVENS CAPSULE STUDY: SHX5432

## 2020-10-11 HISTORY — PX: COLONOSCOPY WITH PROPOFOL: SHX5780

## 2020-10-11 HISTORY — PX: ESOPHAGOGASTRODUODENOSCOPY (EGD) WITH PROPOFOL: SHX5813

## 2020-10-11 LAB — BASIC METABOLIC PANEL
Anion gap: 5 (ref 5–15)
BUN: 21 mg/dL (ref 8–23)
CO2: 22 mmol/L (ref 22–32)
Calcium: 8.8 mg/dL — ABNORMAL LOW (ref 8.9–10.3)
Chloride: 113 mmol/L — ABNORMAL HIGH (ref 98–111)
Creatinine, Ser: 1.31 mg/dL — ABNORMAL HIGH (ref 0.44–1.00)
GFR, Estimated: 40 mL/min — ABNORMAL LOW (ref >60)
Glucose, Bld: 97 mg/dL (ref 70–99)
Potassium: 3.4 mmol/L — ABNORMAL LOW (ref 3.5–5.1)
Sodium: 140 mmol/L (ref 135–145)

## 2020-10-11 LAB — URINE CULTURE: Culture: NO GROWTH

## 2020-10-11 LAB — CBC
HCT: 24 % — ABNORMAL LOW (ref 36.0–46.0)
Hemoglobin: 7.1 g/dL — ABNORMAL LOW (ref 12.0–15.0)
MCH: 24.3 pg — ABNORMAL LOW (ref 26.0–34.0)
MCHC: 29.6 g/dL — ABNORMAL LOW (ref 30.0–36.0)
MCV: 82.2 fL (ref 80.0–100.0)
Platelets: 178 10*3/uL (ref 150–400)
RBC: 2.92 MIL/uL — ABNORMAL LOW (ref 3.87–5.11)
RDW: 17.9 % — ABNORMAL HIGH (ref 11.5–15.5)
WBC: 7 10*3/uL (ref 4.0–10.5)
nRBC: 0.6 % — ABNORMAL HIGH (ref 0.0–0.2)

## 2020-10-11 LAB — TYPE AND SCREEN
ABO/RH(D): O POS
Antibody Screen: NEGATIVE

## 2020-10-11 LAB — PREPARE RBC (CROSSMATCH)

## 2020-10-11 LAB — GLUCOSE, CAPILLARY
Glucose-Capillary: 88 mg/dL (ref 70–99)
Glucose-Capillary: 75 mg/dL (ref 70–99)
Glucose-Capillary: 74 mg/dL (ref 70–99)
Glucose-Capillary: 79 mg/dL (ref 70–99)
Glucose-Capillary: 80 mg/dL (ref 70–99)
Glucose-Capillary: 95 mg/dL (ref 70–99)
Glucose-Capillary: 92 mg/dL (ref 70–99)

## 2020-10-11 SURGERY — ESOPHAGOGASTRODUODENOSCOPY (EGD) WITH PROPOFOL
Anesthesia: Monitor Anesthesia Care

## 2020-10-11 MED ORDER — IPRATROPIUM-ALBUTEROL 0.5-2.5 (3) MG/3ML IN SOLN
3.0000 mL | Freq: Once | RESPIRATORY_TRACT | Status: AC
  Administered 2020-10-11: 3 mL via RESPIRATORY_TRACT

## 2020-10-11 MED ORDER — ATORVASTATIN CALCIUM 10 MG PO TABS
10.0000 mg | ORAL_TABLET | Freq: Every day | ORAL | Status: DC
  Administered 2020-10-11 – 2020-10-13 (×3): 10 mg via ORAL
  Filled 2020-10-11 (×3): qty 1

## 2020-10-11 MED ORDER — LIDOCAINE HCL (PF) 2 % IJ SOLN
INTRAMUSCULAR | Status: AC
  Filled 2020-10-11: qty 5

## 2020-10-11 MED ORDER — EPHEDRINE 5 MG/ML INJ
INTRAVENOUS | Status: AC
  Filled 2020-10-11: qty 5

## 2020-10-11 MED ORDER — PROPOFOL 500 MG/50ML IV EMUL
INTRAVENOUS | Status: DC | PRN
  Administered 2020-10-11: 100 ug/kg/min via INTRAVENOUS

## 2020-10-11 MED ORDER — PROPOFOL 10 MG/ML IV BOLUS
INTRAVENOUS | Status: DC | PRN
  Administered 2020-10-11: 40 mg via INTRAVENOUS

## 2020-10-11 MED ORDER — ESMOLOL HCL 100 MG/10ML IV SOLN
INTRAVENOUS | Status: AC
  Filled 2020-10-11: qty 10

## 2020-10-11 MED ORDER — LIDOCAINE HCL (CARDIAC) PF 100 MG/5ML IV SOSY
PREFILLED_SYRINGE | INTRAVENOUS | Status: DC | PRN
  Administered 2020-10-11: 80 mg via INTRAVENOUS

## 2020-10-11 MED ORDER — SODIUM CHLORIDE 0.9% IV SOLUTION: Freq: Once | INTRAVENOUS | Status: DC

## 2020-10-11 MED ORDER — IPRATROPIUM-ALBUTEROL 0.5-2.5 (3) MG/3ML IN SOLN
RESPIRATORY_TRACT | Status: AC
  Filled 2020-10-11: qty 3

## 2020-10-11 MED ORDER — ESMOLOL HCL 100 MG/10ML IV SOLN
INTRAVENOUS | Status: DC | PRN
  Administered 2020-10-11: 10 mg via INTRAVENOUS

## 2020-10-11 MED ORDER — PHENYLEPHRINE HCL (PRESSORS) 10 MG/ML IV SOLN
INTRAVENOUS | Status: DC | PRN
  Administered 2020-10-11: 200 ug via INTRAVENOUS
  Administered 2020-10-11: 100 ug via INTRAVENOUS
  Administered 2020-10-11 (×2): 200 ug via INTRAVENOUS

## 2020-10-11 MED ORDER — EPHEDRINE SULFATE 50 MG/ML IJ SOLN
INTRAMUSCULAR | Status: DC | PRN
  Administered 2020-10-11 (×2): 5 mg via INTRAVENOUS

## 2020-10-11 MED ORDER — PROPOFOL 500 MG/50ML IV EMUL
INTRAVENOUS | Status: AC
  Filled 2020-10-11: qty 50

## 2020-10-11 MED ORDER — FUROSEMIDE 10 MG/ML IJ SOLN
60.0000 mg | Freq: Once | INTRAMUSCULAR | Status: AC
  Administered 2020-10-11: 60 mg via INTRAVENOUS
  Filled 2020-10-11: qty 6

## 2020-10-11 NOTE — Anesthesia Postprocedure Evaluation (Signed)
Anesthesia Post Note  Patient: Krystal Goodwin  Procedure(s) Performed: ESOPHAGOGASTRODUODENOSCOPY (EGD) WITH PROPOFOL COLONOSCOPY WITH PROPOFOL  Patient location during evaluation: PACU Anesthesia Type: MAC Level of consciousness: awake and alert Pain management: pain level controlled Vital Signs Assessment: post-procedure vital signs reviewed and stable Respiratory status: spontaneous breathing, nonlabored ventilation, respiratory function stable and patient connected to nasal cannula oxygen Cardiovascular status: stable and blood pressure returned to baseline Postop Assessment: no apparent nausea or vomiting Anesthetic complications: no   No notable events documented.   Last Vitals:  Vitals:   10/11/20 0909 10/11/20 0919  BP:  (!) 113/44  Pulse:    Resp:    Temp: (!) 36 C   SpO2:      Last Pain:  Vitals:   10/11/20 0939  TempSrc:   PainSc: 0-No pain                 Yavuz Kirby

## 2020-10-11 NOTE — Transfer of Care (Signed)
Immediate Anesthesia Transfer of Care Note  Patient: Krystal Goodwin  Procedure(s) Performed: ESOPHAGOGASTRODUODENOSCOPY (EGD) WITH PROPOFOL COLONOSCOPY WITH PROPOFOL  Patient Location: Endoscopy Unit  Anesthesia Type:General  Level of Consciousness: drowsy  Airway & Oxygen Therapy: Patient Spontanous Breathing  Post-op Assessment: Report given to RN and Post -op Vital signs reviewed and stable  Post vital signs: Reviewed and stable  Last Vitals:  Vitals Value Taken Time  BP 101/38 10/11/20 0908  Temp    Pulse 80 10/11/20 0910  Resp 21 10/11/20 0910  SpO2 91 % 10/11/20 0910  Vitals shown include unvalidated device data.  Last Pain:  Vitals:   10/11/20 0748  TempSrc: Temporal  PainSc: 0-No pain         Complications: No notable events documented.

## 2020-10-11 NOTE — Op Note (Signed)
[facility] Patient Name: Krystal Goodwin Procedure Date: 10/11/2020 7:17 AM MRN: [id number] Account #: [id number] Date of Birth: [birth year] Admit Type: Inpatient Age: 87 Room: [facility] ENDO ROOM 4 Gender: Female Note Status: Finalized Instrument Name: Colonoscope [id number] Procedure:             Colonoscopy Indications:           Last colonoscopy: August 2018, Unexplained iron                         deficiency anemia Providers:             Lin Landsman MD, MD Referring MD:          Caprice Renshaw MD (Referring MD) Medicines:             General Anesthesia Complications:         No immediate complications. Estimated blood loss: None. Procedure:             Pre-Anesthesia Assessment:                        - Prior to the procedure, a History and Physical was                         performed, and patient medications and allergies were                         reviewed. The patient is unable to give consent                         secondary to the patient being legally incompetent to                         consent. The risks and benefits of the procedure and                         the sedation options and risks were discussed with the                         patient's daughter. All questions were answered and                         informed consent was obtained. Patient identification                         and proposed procedure were verified by the physician,                         the nurse, the anesthesiologist, the anesthetist and                         the technician in the pre-procedure area in the                         procedure room in the endoscopy suite. Mental Status                         Examination: alert and oriented. Airway Examination:  normal oropharyngeal airway and neck mobility.                         Respiratory Examination: clear to auscultation. CV                         Examination: normal.  Prophylactic Antibiotics: The                         patient does not require prophylactic antibiotics.                         Prior Anticoagulants: The patient has taken Eliquis                         (apixaban), last dose was 2 days prior to procedure.                         ASA Grade Assessment: III - A patient with severe                         systemic disease. After reviewing the risks and                         benefits, the patient was deemed in satisfactory                         condition to undergo the procedure. The anesthesia                         plan was to use general anesthesia. Immediately prior                         to administration of medications, the patient was                         re-assessed for adequacy to receive sedatives. The                         heart rate, respiratory rate, oxygen saturations,                         blood pressure, adequacy of pulmonary ventilation, and                         response to care were monitored throughout the                         procedure. The physical status of the patient was                         re-assessed after the procedure.                        After obtaining informed consent, the colonoscope was                         passed under direct vision. Throughout the procedure,  the patient's blood pressure, pulse, and oxygen                         saturations were monitored continuously. The                         Colonoscope was introduced through the anus and                         advanced to the the ileocolonic anastomosis. The                         colonoscopy was performed with moderate difficulty due                         to significant looping and the patient's body habitus.                         Successful completion of the procedure was aided by                         applying abdominal pressure. The patient tolerated the                         procedure  well. The quality of the bowel preparation                         was fair. Findings:      The perianal and digital rectal examinations were normal. Pertinent       negatives include normal sphincter tone and no palpable rectal lesions.      There was evidence of a prior end-to-side ileo-colonic anastomosis in       the ascending colon. This was patent and was characterized by healthy       appearing mucosa. The anastomosis was traversed.      The neo-terminal ileum appeared normal.      The entire examined colon appeared normal.      The retroflexed view of the distal rectum and anal verge was normal and       showed no anal or rectal abnormalities. Impression:            - Preparation of the colon was fair.                        - Patent end-to-side ileo-colonic anastomosis,                         characterized by healthy appearing mucosa.                        - The examined portion of the ileum was normal.                        - The entire examined colon is normal.                        - The distal rectum and anal verge are normal on  retroflexion view.                        - No specimens collected. Recommendation:        - Return patient to hospital ward for ongoing care.                        - Clear liquid diet today.                        - To visualize the small bowel, perform video capsule                         endoscopy today. Procedure Code(s):     --- Professional ---                        [phone], Colonoscopy, flexible; diagnostic, including                         collection of specimen(s) by brushing or washing, when                         performed (separate procedure) Diagnosis Code(s):     --- Professional ---                        Z98.0, Intestinal bypass and anastomosis status                        D50.9, Iron deficiency anemia, unspecified CPT copyright 2019 [employer]. All rights reserved. The codes  documented in this report are preliminary and upon coder review may  be revised to meet current compliance requirements. Dr. Ulyess Mort Lin Landsman MD, MD 10/11/2020 9:08:13 AM This report has been signed electronically. Number of Addenda: 0 Note Initiated On: 10/11/2020 7:17 AM Scope Withdrawal Time: 0 hours 6 minutes 25 seconds  Total Procedure Duration: 0 hours 13 minutes 17 seconds  Estimated Blood Loss:  Estimated blood loss: none.      [facility]

## 2020-10-11 NOTE — Progress Notes (Signed)
Anesthesiologist was notified of fsbs of 95 and that Krystal Goodwin was wheezing a little more than she had been pre-procedure. An order for duoneb to be administered once was given. No treatment was required for fsbs. Duoneb was administered per order, without complications. Krystal Goodwin has been transported back to room 239 in bed.

## 2020-10-11 NOTE — Progress Notes (Addendum)
PROGRESS NOTE    Krystal Goodwin  [id number] DOB: 02-Oct-1933 DOA: 10/08/2020 PCP: Derinda Late, MD    Chief Complaint  Patient presents with   Abnormal Lab    Brief Narrative:  Krystal Goodwin is a 87 y.o. female with medical history significant of dementia Diet controlled DMII, HTN, Aortic valve stenosis s/p aortic valve repair, asthma, colon ca s/p remote partial colectomy with last cscope in system noting no abnormality, IDA, Afib on Eliquis, HLD,Gout,HTN, Hypothyroidism, OSA noncompliant with cpap over the last 3 years who presents from NH due to low hgb  Subjective:  She is hard of hearing, she is confused , not oriented to place or time, but no agitation Vital signs are stable, no overt bleed currently No wheezing, no cough ,leg edema has improved, 500cc urine output documented, not sure is reliable She c/o  knee pain, reports chronic   Assessment & Plan:   Active Problems:   GI bleed   Iron deficiency anemia  Blood loss anemia likely from GI loss with report of melena -CT angio abdomen pelvis was done in the ED no acute bleeding identified -Hemoglobin 4.7 on presentation, prbcx2 on admission, prbc x1 on 9/4 -Continue iv PPI -Hold Eliquis -s/p EGD colonoscopy after Eliquis washout of 48 hours which are unremarkable, plan for vedio capsule study -Follow GI recommendation  AKI on CKD 3B Creatinine peaked at 1.6, Trending down Hold Cozaar Renal dosing medication Monitor creatinine  PAF with h/o heart block s/p PPM Currently regular rate rhythm Hold home meds long-acting metoprolol, changed to Lopressor 12.5 mg twice daily with holding parameters Hold Eliquis due to GI bleed  Chronic diastolic CHF -On oral Lasix 20 mg daily at home -She does has mild edema lower extremity on exam initially -She received PRBC transfusion overnight, s/p  IV Lasix '60mg'$  x1 on 9/2 -Monitor volume status, give  iv lasix '60mg'$  x1 on 9/4 after prbc transfusion  Hypokalemia replace K,  check mag   Diet controlled type 2 diabetes A1c 5.5  Hyperlipidemia Continue statin  Hypothyroidism Continue Synthroid  Dementia, failure to thrive Moved from assisted living to skilled nursing facility 70-monthago, has been having intermittent hallucination in the last few months Per daughter ,  daughter is interested in meeting palliative care, palliative care consulted Risk of delirium in the hospital Daughter reported as needed Xanax works for her at home, ordered 0.25 mg prn  Bilateral knee pain, per daughter is chronic , reports patient does not walk due to chronic knee pain Knee x ray ordered , showed arthritic changes, prn analgesics   Nutritional Assessment: The patient's BMI is: Body mass index is 32.52 kg/m..Marland Kitchen    Unresulted Labs (From admission, onward)     Start     Ordered   10/11/20 0921  [facility],   R       Comments: ACarbon   10/11/20 0920   10/11/20 0921  Prepare RBC (crossmatch)  (Adult Blood Administration - Red Blood Cells)  Once,   R       Question Answer Comment  # of Units 1 unit   Transfusion Indications Actively Bleeding / GI Bleed   Number of Units to Keep Ahead NO units ahead   If emergent release call blood bank [id number]     10/11/20 0920   10/11/20 0500  CBC  Daily,   R     Question:  Specimen collection method  Answer:  Lab=Lab collect   10/10/20 [id number]   10/11/20 [id number]  Basic metabolic panel  Daily,   R     Question:  Specimen collection method  Answer:  Lab=Lab collect   10/10/20 [id number]   10/09/20 2057  MRSA Next Gen by PCR, Nasal  Once,   R        10/09/20 2057              DVT prophylaxis: SCDs Start: 10/08/20 2054   Code Status: Full, confirmed with daughter Family Communication: Daughter on 9/2 and 9/3 Disposition:   Status is: Inpatient  Dispo: The patient is from: SNF              Anticipated d/c is to: SNF               Anticipated d/c date is: Early next week, need gi clearance                 Consultants:  GI  Procedures:   EGD /colonoscopy on 9/4   Antimicrobials:    Anti-infectives (From admission, onward)    Start     Dose/Rate Route Frequency Ordered Stop   10/09/20 1900  [MAR Hold]  cefTRIAXone (ROCEPHIN) 1 g in sodium chloride 0.9 % 100 mL IVPB        (MAR Hold since Sun 10/11/2020 at [place].Hold Reason: Transfer to a Procedural area)   1 g 200 mL/hr over 30 Minutes Intravenous Every 24 hours 10/09/20 1813            Objective: Vitals:   10/11/20 0658 10/11/20 0748 10/11/20 0909 10/11/20 0919  BP:  (!) 130/37  (!) 113/44  Pulse:  70    Resp:  20    Temp:  (!) 96.8 F (36 C) (!) 96.8 F (36 C)   TempSrc:  Temporal Temporal   SpO2:  100%    Weight: 88.6 kg     Height:        Intake/Output Summary (Last 24 hours) at 10/11/2020 0921 Last data filed at 10/11/2020 [id number] Gross per 24 hour  Intake 572.07 ml  Output 500 ml  Net 72.07 ml   Filed Weights   10/08/20 1752 10/11/20 0658  Weight: 88.5 kg 88.6 kg    Examination:  General exam: Frail elderly, baseline dementia, calm and cooperative  Respiratory system: Clear to auscultation. Respiratory effort normal. Cardiovascular system: S1 & S2 heard, RRR. 2/6 precordial murmur, Gastrointestinal system: Abdomen is nondistended, soft and nontender.  Normal bowel sounds heard. Central nervous system: Alert and oriented to self, appear to have dementia, No focal neurological deficits. Extremities: able to lift both leg against gravity, bilateral lower extremity trace pitting edema, left > right Skin: No rashes, lesions or ulcers Psychiatry: dementia.     Data Reviewed: I have personally reviewed following labs and imaging studies  CBC: Recent Labs  Lab 10/08/20 1757 10/09/20 0019 10/09/20 0625 10/09/20 1100 10/09/20 1738 10/10/20 0835 10/10/20 1723 10/11/20 0453  WBC 7.1  --   --   --   --   --   --  7.0  HGB 4.7* 7.3*  7.6* 7.4* 7.5* 7.9* 7.1* 7.1*  HCT 16.9* 23.7* 25.1* 23.6* 23.8*  --   --  24.0*  MCV 78.2*  --   --   --   --   --   --  82.2  PLT 203  --   --   --   --   --   --  0000000    Basic Metabolic Panel: Recent Labs  Lab 10/08/20 1757 10/09/20 0625 10/10/20 0449 10/11/20 0453  NA 136 138 142 140  K 4.7 4.0 4.3 3.4*  CL 110 110 113* 113*  CO2 18* '22 24 22  '$ GLUCOSE 94 95 143* 97  BUN 37* 34* 27* 21  CREATININE 1.60* 1.50* 1.34* 1.31*  CALCIUM 8.8* 8.6* 8.7* 8.8*  MG  --   --  2.0  --     GFR: Estimated Creatinine Clearance: 34.5 mL/min (A) (by C-G formula based on SCr of 1.31 mg/dL (H)).  Liver Function Tests: Recent Labs  Lab 10/08/20 1757 10/09/20 0625  AST 28 19  ALT 13 13  ALKPHOS 65 57  BILITOT 0.6 1.1  PROT 6.3* 6.0*  ALBUMIN 3.3* 2.9*    CBG: Recent Labs  Lab 10/10/20 1617 10/10/20 1934 10/10/20 2327 10/11/20 0431 10/11/20 0756  GLUCAP 87 97 94 88 75     Recent Results (from the past 240 hour(s))  Resp Panel by RT-PCR (Flu A&B, Covid) Nasopharyngeal Swab     Status: None   Collection Time: 10/08/20  8:52 PM   Specimen: Nasopharyngeal Swab; Nasopharyngeal(NP) swabs in vial transport medium  Result Value Ref Range Status   SARS Coronavirus 2 by RT PCR NEGATIVE NEGATIVE Final    Comment: (NOTE) SARS-CoV-2 target nucleic acids are NOT DETECTED.  The SARS-CoV-2 RNA is generally detectable in upper respiratory specimens during the acute phase of infection. The lowest concentration of SARS-CoV-2 viral copies this assay can detect is 138 copies/mL. A negative result does not preclude SARS-Cov-2 infection and should not be used as the sole basis for treatment or other patient management decisions. A negative result may occur with  improper specimen collection/handling, submission of specimen other than nasopharyngeal swab, presence of viral mutation(s) within the areas targeted by this assay, and inadequate number of viral copies(<138 copies/mL). A negative  result must be combined with clinical observations, patient history, and epidemiological information. The expected result is Negative.  Fact Sheet for Patients:  EntrepreneurPulse.com.au  Fact Sheet for Healthcare Providers:  IncredibleEmployment.be  This test is no t yet approved or cleared by the [place] FDA and  has been authorized for detection and/or diagnosis of SARS-CoV-2 by FDA under an Emergency Use Authorization (EUA). This EUA will remain  in effect (meaning this test can be used) for the duration of the COVID-19 declaration under Section 564(b)(1) of the Act, 21 U.S.C.section 360bbb-3(b)(1), unless the authorization is terminated  or revoked sooner.       Influenza A by PCR NEGATIVE NEGATIVE Final   Influenza B by PCR NEGATIVE NEGATIVE Final    Comment: (NOTE) The Xpert Xpress SARS-CoV-2/FLU/RSV plus assay is intended as an aid in the diagnosis of influenza from Nasopharyngeal swab specimens and should not be used as a sole basis for treatment. Nasal washings and aspirates are unacceptable for Xpert Xpress SARS-CoV-2/FLU/RSV testing.  Fact Sheet for Patients: EntrepreneurPulse.com.au  Fact Sheet for Healthcare Providers: IncredibleEmployment.be  This test is not yet approved or cleared by the [place] FDA and has been authorized for detection and/or diagnosis of SARS-CoV-2 by FDA under an Emergency Use Authorization (EUA). This EUA will remain in effect (meaning this test can be used) for the duration of the COVID-19 declaration under Section 564(b)(1) of the Act, 21 U.S.C. section 360bbb-3(b)(1), unless the authorization is terminated or revoked.  Performed at [facility], [address] [place], [place] [address]   Urine Culture  Status: None   Collection Time: 10/09/20  5:32 PM   Specimen: Urine, Random  Result Value Ref Range Status   Specimen Description    Final    URINE, RANDOM Performed at [facility], [address], [place], [place] [address]    Special Requests   Final    NONE Performed at [facility], [address], [place], [place] [address]    Culture   Final    NO GROWTH Performed at [facility], [place] [address] [place], [place] [address]    Report Status 10/11/2020 FINAL  Final         Radiology Studies: DG Chest Port 1 View  Result Date: 10/09/2020 CLINICAL DATA:  Shortness of breath EXAM: PORTABLE CHEST 1 VIEW COMPARISON:  Radiograph 09/04/2019 FINDINGS: Unchanged cardiomediastinal silhouette with prior median sternotomy, aortic valve replacement, and dual chamber pacemaker leads. There is no new focal airspace disease. There is no large pleural effusion or visible pneumothorax. No acute osseous abnormality. Unchanged appearance of the right glenoid. Right upper quadrant surgical clips, likely prior cholecystectomy. Unchanged calcification overlying the right axilla. IMPRESSION: No evidence of acute cardiopulmonary disease. Electronically Signed   By: Maurine Simmering M.D.   On: 10/09/2020 15:07   DG Knee Complete 4 Views Left  Result Date: 10/10/2020 CLINICAL DATA:  LEFT knee pain of unspecified chronicity EXAM: LEFT KNEE - COMPLETE 4+ VIEW COMPARISON:  LEFT tibial and fibular radiographs 03/28/2020 FINDINGS: Osseous demineralization. Diffuse joint space narrowing greatest at lateral compartment with bone-on-bone appearance, spur formation, and subchondral sclerosis are identified. No acute fracture, dislocation, or bone destruction. Minimal spurring at cranial margin of patella. Slight genu valgus. IMPRESSION: Advanced degenerative changes LEFT knee primarily at lateral compartment. Electronically Signed   By: Lavonia Dana M.D.   On: 10/10/2020 14:58   DG Knee Complete 4 Views Right  Result Date: 10/10/2020 CLINICAL DATA:  Chronic RIGHT knee pain EXAM: RIGHT KNEE - COMPLETE 4+ VIEW COMPARISON:  None  FINDINGS: Osseous demineralization. Tricompartmental joint space narrowing greatest at lateral compartment where bone-on-bone appearance is seen. Minimal lateral compartment spur formation. No fracture, dislocation, bone destruction, or joint effusion. IMPRESSION: Osseous demineralization with degenerative changes greatest at lateral compartment. No acute abnormalities. Electronically Signed   By: Lavonia Dana M.D.   On: 10/10/2020 15:00   ECHOCARDIOGRAM COMPLETE  Result Date: 10/09/2020    ECHOCARDIOGRAM REPORT   Patient Name:   OUMY EUDY Date of Exam: 10/09/2020 Medical Rec #:  [id number]    Height:       65.0 in Accession #:    [id number]   Weight:       195.1 lb Date of Birth:  12/22/1933     BSA:          1.957 m Patient Age:    26 years     BP:           128/46 mmHg Patient Gender: F            HR:           78 bpm. Exam Location:  [facility] Procedure: 2D Echo, Color Doppler and Cardiac Doppler Indications:     I50.9 Congestive Heart Failure  History:         Patient has no prior history of Echocardiogram examinations.                  Aortic Valve Disease; Risk Factors:Diabetes, Dyslipidemia,  Hypertension and Sleep Apnea. Asthma.  Sonographer:     Charmayne Sheer Referring Phys:  [id number] [facility] A THOMAS Diagnosing Phys: Neoma Laming  Sonographer Comments: Suboptimal parasternal window and no subcostal window. IMPRESSIONS  1. Left ventricular ejection fraction, by estimation, is 50 to 55%. The left ventricle has low normal function. The left ventricle has no regional wall motion abnormalities. There is mild concentric left ventricular hypertrophy. Left ventricular diastolic parameters are consistent with Grade II diastolic dysfunction (pseudonormalization).  2. Right ventricular systolic function is mildly reduced. The right ventricular size is mildly enlarged.  3. Left atrial size was mildly dilated.  4. Right atrial size was mildly dilated.  5. The mitral valve is degenerative. Mild mitral valve  regurgitation. No evidence of mitral stenosis. Moderate mitral annular calcification.  6. The aortic valve is calcified. Aortic valve regurgitation is trivial. Mild aortic valve sclerosis is present, with no evidence of aortic valve stenosis.  7. The inferior vena cava is normal in size with greater than 50% respiratory variability, suggesting right atrial pressure of 3 mmHg. FINDINGS  Left Ventricle: Left ventricular ejection fraction, by estimation, is 50 to 55%. The left ventricle has low normal function. The left ventricle has no regional wall motion abnormalities. The left ventricular internal cavity size was normal in size. There is mild concentric left ventricular hypertrophy. Left ventricular diastolic parameters are consistent with Grade II diastolic dysfunction (pseudonormalization). Right Ventricle: The right ventricular size is mildly enlarged. No increase in right ventricular wall thickness. Right ventricular systolic function is mildly reduced. Left Atrium: Left atrial size was mildly dilated. Right Atrium: Right atrial size was mildly dilated. Pericardium: There is no evidence of pericardial effusion. Mitral Valve: The mitral valve is degenerative in appearance. Moderate mitral annular calcification. Mild mitral valve regurgitation. No evidence of mitral valve stenosis. MV peak gradient, 9.1 mmHg. The mean mitral valve gradient is 4.0 mmHg. Tricuspid Valve: The tricuspid valve is normal in structure. Tricuspid valve regurgitation is not demonstrated. No evidence of tricuspid stenosis. Aortic Valve: The aortic valve is calcified. Aortic valve regurgitation is trivial. Mild aortic valve sclerosis is present, with no evidence of aortic valve stenosis. Aortic valve mean gradient measures 16.0 mmHg. Aortic valve peak gradient measures 28.3  mmHg. Aortic valve area, by VTI measures 1.64 cm. Pulmonic Valve: The pulmonic valve was normal in structure. Pulmonic valve regurgitation is not visualized. No  evidence of pulmonic stenosis. Aorta: The aortic root is normal in size and structure. Venous: The inferior vena cava is normal in size with greater than 50% respiratory variability, suggesting right atrial pressure of 3 mmHg. IAS/Shunts: No atrial level shunt detected by color flow Doppler.  LEFT VENTRICLE PLAX 2D LVIDd:         4.26 cm  Diastology LVIDs:         3.08 cm  LV e' medial:    5.77 cm/s LV PW:         0.98 cm  LV E/e' medial:  27.0 LV IVS:        0.68 cm  LV e' lateral:   10.20 cm/s LVOT diam:     1.90 cm  LV E/e' lateral: 15.3 LV SV:         96 LV SV Index:   49 LVOT Area:     2.84 cm  LEFT ATRIUM             Index LA diam:        4.30 cm 2.20 cm/m LA Vol (  A2C):   26.4 ml 13.49 ml/m LA Vol (A4C):   42.9 ml 21.92 ml/m LA Biplane Vol: 36.9 ml 18.85 ml/m  AORTIC VALVE                    PULMONIC VALVE AV Area (Vmax):    1.47 cm     PV Vmax:       0.70 m/s AV Area (Vmean):   1.44 cm     PV Vmean:      48.800 cm/s AV Area (VTI):     1.64 cm     PV VTI:        0.118 m AV Vmax:           266.00 cm/s  PV Peak grad:  1.9 mmHg AV Vmean:          186.500 cm/s PV Mean grad:  1.0 mmHg AV VTI:            0.583 m AV Peak Grad:      28.3 mmHg AV Mean Grad:      16.0 mmHg LVOT Vmax:         138.00 cm/s LVOT Vmean:        94.700 cm/s LVOT VTI:          0.338 m LVOT/AV VTI ratio: 0.58  AORTA Ao Root diam: 2.10 cm MITRAL VALVE MV Area (PHT): 4.46 cm     SHUNTS MV Area VTI:   2.16 cm     Systemic VTI:  0.34 m MV Peak grad:  9.1 mmHg     Systemic Diam: 1.90 cm MV Mean grad:  4.0 mmHg MV Vmax:       1.51 m/s MV Vmean:      91.9 cm/s MV Decel Time: 170 msec MV E velocity: 156.00 cm/s MV A velocity: 113.00 cm/s MV E/A ratio:  1.38 Shaukat Khan Electronically signed by Neoma Laming Signature Date/Time: 10/09/2020/3:08:16 PM    Final         Scheduled Meds:  sodium chloride   Intravenous Once   [MAR Hold] diclofenac Sodium  4 g Topical QID   [MAR Hold] levothyroxine  100 mcg Oral Q0600   [MAR Hold] metoprolol  tartrate  12.5 mg Oral BID   [MAR Hold] pantoprazole  40 mg Intravenous Q12H   [MAR Hold] polyethylene glycol  17 g Oral Daily   [MAR Hold] sodium chloride flush  3 mL Intravenous Q12H   Continuous Infusions:  sodium chloride 20 mL/hr at 10/11/20 0826   [MAR Hold] cefTRIAXone (ROCEPHIN)  IV 1 g (10/10/20 2127)   pantoprazole 8 mg/hr (10/10/20 2213)     LOS: 3 days   Time spent: 56mns Greater than 50% of this time was spent in counseling, explanation of diagnosis, planning of further management, and coordination of care.   Voice Recognition /Viviann Sparedictation system was used to create this note, attempts have been made to correct errors. Please contact the author with questions and/or clarifications.   FFlorencia Reasons MD PhD FACP [facility]  Available via Epic secure chat 7am-7pm for nonurgent issues Please page for urgent issues To page the attending provider between 7A-7P or the covering provider during after hours 7P-7A, please log into the web site [email or web address] and access using universal [place] password for that web site. If you do not have the password, please call the hospital operator.    10/11/2020, 9:21 AM

## 2020-10-11 NOTE — Op Note (Addendum)
[facility]ology Patient Name: Krystal Goodwin Procedure Date: 10/11/2020 8:07 AM MRN: [id number] Account #: [id number] Date of Birth: [birth year] Admit Type: Inpatient Age: 87 Room: [facility] ENDO ROOM 4 Gender: Female Note Status: Finalized Instrument Name: Michaelle Birks [id number] Procedure:             Upper GI endoscopy Indications:           Unexplained iron deficiency anemia Providers:             Lin Landsman MD, MD Referring MD:          Caprice Renshaw MD (Referring MD) Medicines:             General Anesthesia Complications:         No immediate complications. Estimated blood loss: None. Procedure:             Pre-Anesthesia Assessment:                        - Prior to the procedure, a History and Physical was                         performed, and patient medications and allergies were                         reviewed. The patient is unable to give consent                         secondary to the patient being legally incompetent to                         consent. The risks and benefits of the procedure and                         the sedation options and risks were discussed with the                         patient's daughter. All questions were answered and                         informed consent was obtained. Patient identification                         and proposed procedure were verified by the physician,                         the nurse, the anesthesiologist, the anesthetist and                         the technician in the pre-procedure area in the                         procedure room in the endoscopy suite. Mental Status                         Examination: alert and oriented. Airway Examination:                         normal oropharyngeal airway and neck mobility.  Respiratory Examination: clear to auscultation. CV                         Examination: normal. Prophylactic Antibiotics: The                          patient does not require prophylactic antibiotics.                         Prior Anticoagulants: The patient has taken Eliquis                         (apixaban), last dose was 2 days prior to procedure.                         ASA Grade Assessment: III - A patient with severe                         systemic disease. After reviewing the risks and                         benefits, the patient was deemed in satisfactory                         condition to undergo the procedure. The anesthesia                         plan was to use general anesthesia. Immediately prior                         to administration of medications, the patient was                         re-assessed for adequacy to receive sedatives. The                         heart rate, respiratory rate, oxygen saturations,                         blood pressure, adequacy of pulmonary ventilation, and                         response to care were monitored throughout the                         procedure. The physical status of the patient was                         re-assessed after the procedure.                        After obtaining informed consent, the endoscope was                         passed under direct vision. Throughout the procedure,                         the patient's blood pressure, pulse, and oxygen  saturations were monitored continuously. The Endoscope                         was introduced through the mouth, and advanced to the                         second part of duodenum. The upper GI endoscopy was                         accomplished without difficulty. The patient tolerated                         the procedure fairly well. Findings:      The duodenal bulb and second portion of the duodenum were normal.      The entire examined stomach was normal. Biopsies were taken with a cold       forceps for Helicobacter pylori testing.      A 4 cm hiatal hernia with a single  Cameron ulcer was found. The proximal       extent of the gastric folds (end of tubular esophagus) was 35 cm from       the incisors. The hiatal narrowing was 38 cm from the incisors. The       Z-line was 35 cm from the incisors.      The gastroesophageal junction and examined esophagus were normal. Impression:            - Normal duodenal bulb and second portion of the                         duodenum.                        - Normal stomach. Biopsied.                        - 4 cm hiatal hernia with a single Cameron ulcer.                        - Normal gastroesophageal junction and esophagus. Recommendation:        - Await pathology results.                        - Proceed with colonoscopy as scheduled                        See colonoscopy report                        - Follow an antireflux regimen indefinitely.                        - Use Protonix (pantoprazole) 40 mg PO BID                         indefinitely due to presence of hiatal hernia and                         cameron ulcer. Procedure Code(s):     --- Professional ---  [id number], Esophagogastroduodenoscopy, flexible,                         transoral; with biopsy, single or multiple Diagnosis Code(s):     --- Professional ---                        K44.9, Diaphragmatic hernia without obstruction or                         gangrene                        K25.9, Gastric ulcer, unspecified as acute or chronic,                         without hemorrhage or perforation                        D50.9, Iron deficiency anemia, unspecified CPT copyright 2019 American Medical Association. All rights reserved. The codes documented in this report are preliminary and upon coder review may  be revised to meet current compliance requirements. Dr. Ulyess Mort Lin Landsman MD, MD 10/11/2020 9:08:47 AM This report has been signed electronically. Number of Addenda: 0 Note Initiated On: 10/11/2020 8:07  AM Estimated Blood Loss:  Estimated blood loss: none.      [facility]

## 2020-10-11 NOTE — Plan of Care (Signed)

## 2020-10-12 DIAGNOSIS — D500 Iron deficiency anemia secondary to blood loss (chronic): Secondary | ICD-10-CM | POA: Diagnosis not present

## 2020-10-12 LAB — BPAM RBC
Blood Product Expiration Date: 202210042359
Blood Product Expiration Date: 202210052359
Blood Product Expiration Date: 202210052359
Blood Product Expiration Date: 202210052359
ISSUE DATE / TIME: 202209011924
ISSUE DATE / TIME: 202209012208
ISSUE DATE / TIME: 202209041222
Unit Type and Rh: 5100
Unit Type and Rh: 5100
Unit Type and Rh: 5100
Unit Type and Rh: 5100

## 2020-10-12 LAB — CBC
HCT: 24.5 % — ABNORMAL LOW (ref 36.0–46.0)
Hemoglobin: 7.9 g/dL — ABNORMAL LOW (ref 12.0–15.0)
MCH: 26.3 pg (ref 26.0–34.0)
MCHC: 32.2 g/dL (ref 30.0–36.0)
MCV: 81.7 fL (ref 80.0–100.0)
Platelets: 156 10*3/uL (ref 150–400)
RBC: 3 MIL/uL — ABNORMAL LOW (ref 3.87–5.11)
RDW: 18.1 % — ABNORMAL HIGH (ref 11.5–15.5)
WBC: 6.9 10*3/uL (ref 4.0–10.5)
nRBC: 0.3 % — ABNORMAL HIGH (ref 0.0–0.2)

## 2020-10-12 LAB — TYPE AND SCREEN
ABO/RH(D): O POS
Antibody Screen: NEGATIVE
Unit division: 0
Unit division: 0
Unit division: 0
Unit division: 0

## 2020-10-12 LAB — GLUCOSE, CAPILLARY
Glucose-Capillary: 103 mg/dL — ABNORMAL HIGH (ref 70–99)
Glucose-Capillary: 170 mg/dL — ABNORMAL HIGH (ref 70–99)
Glucose-Capillary: 226 mg/dL — ABNORMAL HIGH (ref 70–99)
Glucose-Capillary: 263 mg/dL — ABNORMAL HIGH (ref 70–99)
Glucose-Capillary: 87 mg/dL (ref 70–99)
Glucose-Capillary: 92 mg/dL (ref 70–99)

## 2020-10-12 LAB — BASIC METABOLIC PANEL
Anion gap: 3 — ABNORMAL LOW (ref 5–15)
BUN: 14 mg/dL (ref 8–23)
CO2: 24 mmol/L (ref 22–32)
Calcium: 8.5 mg/dL — ABNORMAL LOW (ref 8.9–10.3)
Chloride: 113 mmol/L — ABNORMAL HIGH (ref 98–111)
Creatinine, Ser: 1.15 mg/dL — ABNORMAL HIGH (ref 0.44–1.00)
GFR, Estimated: 47 mL/min — ABNORMAL LOW (ref >60)
Glucose, Bld: 96 mg/dL (ref 70–99)
Potassium: 3.2 mmol/L — ABNORMAL LOW (ref 3.5–5.1)
Sodium: 140 mmol/L (ref 135–145)

## 2020-10-12 LAB — MAGNESIUM: Magnesium: 1.7 mg/dL (ref 1.7–2.4)

## 2020-10-12 MED ORDER — SODIUM CHLORIDE 0.9 % IV SOLN
510.0000 mg | Freq: Once | INTRAVENOUS | Status: AC
  Administered 2020-10-12: 510 mg via INTRAVENOUS
  Filled 2020-10-12: qty 17

## 2020-10-12 MED ORDER — APIXABAN 5 MG PO TABS
5.0000 mg | ORAL_TABLET | Freq: Two times a day (BID) | ORAL | Status: DC
  Administered 2020-10-12 – 2020-10-13 (×3): 5 mg via ORAL
  Filled 2020-10-12 (×3): qty 1

## 2020-10-12 MED ORDER — IPRATROPIUM-ALBUTEROL 0.5-2.5 (3) MG/3ML IN SOLN
3.0000 mL | Freq: Four times a day (QID) | RESPIRATORY_TRACT | Status: DC
  Administered 2020-10-12: 3 mL via RESPIRATORY_TRACT
  Filled 2020-10-12: qty 3

## 2020-10-12 MED ORDER — FERROUS SULFATE 325 (65 FE) MG PO TABS
325.0000 mg | ORAL_TABLET | Freq: Every day | ORAL | Status: DC
  Administered 2020-10-13: 325 mg via ORAL
  Filled 2020-10-12: qty 1

## 2020-10-12 MED ORDER — HYDROCORTISONE 1 % EX OINT
TOPICAL_OINTMENT | Freq: Three times a day (TID) | CUTANEOUS | Status: DC
  Filled 2020-10-12: qty 28.35

## 2020-10-12 MED ORDER — IPRATROPIUM-ALBUTEROL 0.5-2.5 (3) MG/3ML IN SOLN
3.0000 mL | Freq: Three times a day (TID) | RESPIRATORY_TRACT | Status: DC
  Administered 2020-10-12 – 2020-10-13 (×3): 3 mL via RESPIRATORY_TRACT
  Filled 2020-10-12 (×3): qty 3

## 2020-10-12 MED ORDER — PREDNISONE 20 MG PO TABS
20.0000 mg | ORAL_TABLET | Freq: Every day | ORAL | Status: DC
  Administered 2020-10-12 – 2020-10-13 (×2): 20 mg via ORAL
  Filled 2020-10-12 (×2): qty 1

## 2020-10-12 MED ORDER — PANTOPRAZOLE SODIUM 40 MG PO TBEC
40.0000 mg | DELAYED_RELEASE_TABLET | Freq: Two times a day (BID) | ORAL | Status: DC
  Administered 2020-10-12 – 2020-10-13 (×3): 40 mg via ORAL
  Filled 2020-10-12 (×3): qty 1

## 2020-10-12 MED ORDER — INSULIN ASPART 100 UNIT/ML IJ SOLN: 0.0000 [IU] | Freq: Three times a day (TID) | INTRAMUSCULAR | Status: DC

## 2020-10-12 MED ORDER — INSULIN ASPART 100 UNIT/ML IJ SOLN: 0.0000 [IU] | Freq: Every day | INTRAMUSCULAR | Status: DC

## 2020-10-12 MED ORDER — POTASSIUM CHLORIDE 20 MEQ PO PACK
40.0000 meq | PACK | Freq: Once | ORAL | Status: AC
  Administered 2020-10-12: 40 meq via ORAL
  Filled 2020-10-12: qty 2

## 2020-10-12 NOTE — NC FL2 (Signed)
[place] LEVEL OF CARE SCREENING TOOL     IDENTIFICATION  Patient Name: NEONA TALLENT Birthdate: [birth year] Sex: female Admission Date (Current Location): 10/08/2020  [facility] and [place] Number:  Engineering geologist and Address:  [facility], [address] [place], [place] [address]      Provider Number: [id number]  Attending Physician Name and Address:  Shelly Coss, MD  Relative Name and Phone Number:  Mitchell Heir (daughter) [phone]    Current Level of Care: Hospital Recommended Level of Care: [place]  Prior Approval Number:    Date Approved/Denied:   PASRR Number: [id number]  Discharge Plan: SNF    Current Diagnoses: Patient Active Problem List   Diagnosis Date Noted   Iron deficiency anemia    GI bleed 10/08/2020   Closed fracture of left hip ([place]) 09/05/2019   Diabetes mellitus type II, non insulin dependent ([place]) 09/05/2019   Hypothyroidism    CKD (chronic kidney disease), stage III (HCC)    Hypertension    Sleep apnea    PAF (paroxysmal atrial fibrillation) ([place])    Fall     Orientation RESPIRATION BLADDER Height & Weight     Self, Place  Normal Incontinent, External catheter Weight: 194 lb 6.4 oz (88.2 kg) Height:  '5\' 5"'$  (165.1 cm)  BEHAVIORAL SYMPTOMS/MOOD NEUROLOGICAL BOWEL NUTRITION STATUS      Incontinent Diet (see discharge summary)  AMBULATORY STATUS COMMUNICATION OF NEEDS Skin   Limited Assist Verbally Normal                       Personal Care Assistance Level of Assistance  Bathing, Feeding, Dressing, Total care Bathing Assistance: Limited assistance Feeding assistance: Independent Dressing Assistance: Limited assistance Total Care Assistance: Limited assistance   Functional Limitations Info  Sight, Hearing, Speech Sight Info: Adequate Hearing Info: Adequate Speech Info: Adequate    SPECIAL CARE FACTORS FREQUENCY                       Contractures  Contractures Info: Not present    Additional Factors Info  Code Status, Allergies Code Status Info: full Allergies Info: amoxicillin, erythromycin, vicodin           Current Medications (10/12/2020):  This is the current hospital active medication list Current Facility-Administered Medications  Medication Dose Route Frequency Provider Last Rate Last Admin   0.9 %  sodium chloride infusion (Manually program via Guardrails IV Fluids)   Intravenous Once Florencia Reasons, MD       ALPRAZolam Duanne Moron) tablet 0.25 mg  0.25 mg Oral TID PRN Florencia Reasons, MD   0.25 mg at 10/10/20 0946   atorvastatin (LIPITOR) tablet 10 mg  10 mg Oral Daily Florencia Reasons, MD   10 mg at 10/12/20 0851   cyclobenzaprine (FLEXERIL) tablet 5 mg  5 mg Oral TID PRN Florencia Reasons, MD   5 mg at 10/11/20 0542   diclofenac Sodium (VOLTAREN) 1 % topical gel 4 g  4 g Topical QID Myles Rosenthal A, MD   4 g at 10/12/20 0854   levalbuterol (XOPENEX) nebulizer solution 0.63 mg  0.63 mg Nebulization Q8H PRN Florencia Reasons, MD   0.63 mg at 10/09/20 1622   levothyroxine (SYNTHROID) tablet 100 mcg  100 mcg Oral Q0600 Myles Rosenthal A, MD   100 mcg at 10/12/20 0617   metoprolol tartrate (LOPRESSOR) tablet 12.5 mg  12.5 mg Oral BID Florencia Reasons, MD   12.5 mg  at 10/12/20 0851   morphine 2 MG/ML injection 1 mg  1 mg Intravenous Q8H PRN Myles Rosenthal A, MD   1 mg at 10/11/20 1229   pantoprazole (PROTONIX) injection 40 mg  40 mg Intravenous Q12H Lucrezia Starch, MD   40 mg at 10/12/20 0617   polyethylene glycol (MIRALAX / GLYCOLAX) packet 17 g  17 g Oral Daily Florencia Reasons, MD   17 g at 10/12/20 0852   sodium chloride flush (NS) 0.9 % injection 3 mL  3 mL Intravenous Q12H Clance Boll, MD   3 mL at 10/12/20 [id number]     Discharge Medications: Please see discharge summary for a list of discharge medications.  Relevant Imaging Results:  Relevant Lab Results:   Additional Information SSN: [id number]  Alberteen Sam, LCSW

## 2020-10-12 NOTE — TOC Initial Note (Signed)
Transition of Care [facility]) - Initial/Assessment Note    Patient Details  Name: Krystal Goodwin MRN: [id number] Date of Birth: 09-12-1933  Transition of Care [facility]) CM/SW Contact:    Alberteen Sam, LCSW Phone Number: 10/12/2020, 9:00 AM  Clinical Narrative:                  CSW spoke with patient's daughter Mitchell Heir, confirmed patient is from [facility] SNF side, and plan is for her to return at time of discharge. Is long term there, has been there for almost 4 months now and is currently using her medicaid.   Allyson to be updated by CSW once discharge time frame is set.   CSW did lvm with Chrystal Richardson at [facility] who is working in admission today to inform of potential discharge soon back to [facility].   Pending medical readiness to dc back to [facility].   Expected Discharge Plan: Skilled Nursing Facility Barriers to Discharge: Continued Medical Work up   Patient Goals and CMS Choice   CMS Medicare.gov Compare Post Acute Care list provided to:: Patient Represenative (must comment) (daughter Mitchell Heir) Choice offered to / list presented to : Adult Children  Expected Discharge Plan and Services Expected Discharge Plan: [place] Acute Care Choice: [place] arrangements for the past 2 months: [place]                                      Prior Living Arrangements/Services Living arrangements for the past 2 months: [place] Lives with:: Facility Resident Patient language and need for interpreter reviewed:: Yes Do you feel safe going back to the place where you live?: Yes      Need for Family Participation in Patient Care: Yes (Comment) Care giver support system in place?: Yes (comment)   Criminal Activity/Legal Involvement Pertinent to Current Situation/Hospitalization: No - Comment as needed  Activities of Daily Living Home Assistive Devices/Equipment:  Wheelchair ADL Screening (condition at time of admission) Patient's cognitive ability adequate to safely complete daily activities?: No Is the patient deaf or have difficulty hearing?: Yes Does the patient have difficulty seeing, even when wearing glasses/contacts?: No Does the patient have difficulty concentrating, remembering, or making decisions?: Yes Patient able to express need for assistance with ADLs?: Yes Does the patient have difficulty dressing or bathing?: Yes Independently performs ADLs?: No Communication: Needs assistance Is this a change from baseline?: Pre-admission baseline Dressing (OT): Needs assistance Is this a change from baseline?: Pre-admission baseline Grooming: Needs assistance Is this a change from baseline?: Pre-admission baseline Feeding: Needs assistance Is this a change from baseline?: Pre-admission baseline Bathing: Needs assistance Is this a change from baseline?: Pre-admission baseline Toileting: Needs assistance Is this a change from baseline?: Pre-admission baseline In/Out Bed: Needs assistance Is this a change from baseline?: Pre-admission baseline Walks in Home: Dependent Is this a change from baseline?: Pre-admission baseline Does the patient have difficulty walking or climbing stairs?: Yes Weakness of Legs: Both Weakness of Arms/Hands: Both  Permission Sought/Granted Permission sought to share information with : Case Manager, Customer service manager, Family Supports Permission granted to share information with : Yes, Verbal Permission Granted  Share Information with NAME: Allyson  Permission granted to share info w AGENCY: SNFs  Permission granted to share info w Relationship: daughter  Permission granted  to share info w Contact Information: [phone]  Emotional Assessment       Orientation: : Oriented to  Time, Oriented to Self Alcohol / Substance Use: Not Applicable Psych Involvement: No (comment)  Admission diagnosis:   SOB (shortness of breath) [R06.02] GI bleed [K92.2] Gastrointestinal hemorrhage, unspecified gastrointestinal hemorrhage type [K92.2] Anemia, unspecified type [D64.9] Patient Active Problem List   Diagnosis Date Noted   Iron deficiency anemia    GI bleed 10/08/2020   Closed fracture of left hip ([place]) 09/05/2019   Diabetes mellitus type II, non insulin dependent ([place]) 09/05/2019   Hypothyroidism    CKD (chronic kidney disease), stage III (HCC)    Hypertension    Sleep apnea    PAF (paroxysmal atrial fibrillation) ([place])    Fall    PCP:  Derinda Late, MD Pharmacy:   [place], [place] [address] [place] [address] Phone: [phone] Fax: [phone]     Social Determinants of Health (SDOH) Interventions    Readmission Risk Interventions No flowsheet data found.

## 2020-10-12 NOTE — Care Management Important Message (Signed)
Important Message  Patient Details  Name: Krystal Goodwin MRN: [id number] Date of Birth: [birth year]   Medicare Important Message Given:  Yes     Juliann Pulse A Daquann Merriott 10/12/2020, 1:49 PM

## 2020-10-12 NOTE — Evaluation (Signed)
Physical Therapy Evaluation Patient Details Name: Krystal Goodwin MRN: [id number] DOB: 08-03-1933 Today's Date: 10/12/2020   History of Present Illness  Pt is an 87 y/o F admitted on 10/08/20 with c/c of low Hgb. CT angio abdomen pelvis was done in the ED no acute bleeding identified, hemoglobin 4.7 on presentation. PMH: Diet controlled DM2, aortic valve stenosis s/p aortic valve repair, mild dementia, asthma, colon CA s/p remote partial colectomy, IDA, a-fib on eqliquis, HLD, gout, HTN, hypothyroidism, OSA noncompliant with CPAP  Clinical Impression  Pt seen for PT evaluation with pt reporting she completes stand pivot transfers to w/c at baseline. On this date pt is able to complete supine>sit with supervision with heavy reliance on hospital bed features, max assist to fully scoot to sitting EOB, and mod assist for stand pivot to recliner with HHA. Pt demonstrates impaired cognition but has baseline cognitive deficits & requires cuing to sustain attention to tasks during session. Will continue to see pt in acute setting to maintain mobility, increase independence with stand pivot transfers & for cardiopulmonary endurance training.     Follow Up Recommendations SNF    Equipment Recommendations  None recommended by PT    Recommendations for Other Services       Precautions / Restrictions Precautions Precautions: Fall Restrictions Weight Bearing Restrictions: No      Mobility  Bed Mobility Overal bed mobility: Needs Assistance Bed Mobility: Supine to Sit     Supine to sit: Supervision;HOB elevated     General bed mobility comments: Pt is able to transfer supine>sit with HOB elevated, bed rails & supervision but requires extra time & ultimately max assist to scoot closer to EOB to allow BLE to touch floor.    Transfers Overall transfer level: Needs assistance   Transfers: Stand Pivot Transfers;Sit to/from Stand Sit to Stand: Mod assist Stand pivot transfers: Mod assist        General transfer comment: extra time & cuing to complete stand pivot bed>recliner on L  Ambulation/Gait                Stairs            Wheelchair Mobility    Modified Rankin (Stroke Patients Only)       Balance Overall balance assessment: Needs assistance Sitting-balance support: Bilateral upper extremity supported;Feet supported Sitting balance-Leahy Scale: Fair     Standing balance support: Bilateral upper extremity supported;During functional activity Standing balance-Leahy Scale: Poor                               Pertinent Vitals/Pain Pain Assessment: Faces Faces Pain Scale: Hurts a little bit Pain Location: knees Pain Descriptors / Indicators: Aching (chronic pain) Pain Intervention(s): Monitored during session;RN gave pain meds during session    Home Living Family/patient expects to be discharged to:: Skilled nursing facility                      Prior Function           Comments: Non ambulatory 2/2 chronic knee arthritis. Pt completes stand pivot transfers to w/c with assistance.     Hand Dominance        Extremity/Trunk Assessment   Upper Extremity Assessment Upper Extremity Assessment: Generalized weakness    Lower Extremity Assessment Lower Extremity Assessment: Generalized weakness    Cervical / Trunk Assessment Cervical / Trunk Assessment: Kyphotic  Communication   Communication: [facility]  Cognition Arousal/Alertness: Awake/alert Behavior During Therapy: WFL for tasks assessed/performed Overall Cognitive Status: History of cognitive impairments - at baseline                                 General Comments: Pt pleasant & agreeable to tx, requires extra time to follow simple commands & cuing to sustain attention to task.      General Comments General comments (skin integrity, edema, etc.): Pt on 2L/min with SpO2 >90% but pt wheezing & nurse present & aware.    Exercises      Assessment/Plan    PT Assessment Patient needs continued PT services  PT Problem List Decreased strength;Decreased mobility;Decreased balance;Cardiopulmonary status limiting activity;Decreased activity tolerance       PT Treatment Interventions DME instruction;Therapeutic exercise;Balance training;Wheelchair mobility training;Neuromuscular re-education;Functional mobility training;Modalities;Therapeutic activities;Patient/family education    PT Goals (Current goals can be found in the Care Plan section)  Acute Rehab PT Goals Patient Stated Goal: none stated PT Goal Formulation: With patient Time For Goal Achievement: 10/26/20 Potential to Achieve Goals: Good    Frequency Min 2X/week   Barriers to discharge        Co-evaluation               AM-PAC PT "6 Clicks" Mobility  Outcome Measure Help needed turning from your back to your side while in a flat bed without using bedrails?: A Little Help needed moving from lying on your back to sitting on the side of a flat bed without using bedrails?: A Lot Help needed moving to and from a bed to a chair (including a wheelchair)?: A Lot Help needed standing up from a chair using your arms (e.g., wheelchair or bedside chair)?: A Lot Help needed to walk in hospital room?: Total Help needed climbing 3-5 steps with a railing? : Total 6 Click Score: 11    End of Session Equipment Utilized During Treatment: Oxygen Activity Tolerance: Patient tolerated treatment well Patient left: in chair;with nursing/sitter in room;with family/visitor present Nurse Communication: Mobility status PT Visit Diagnosis: Unsteadiness on feet (R26.81);Muscle weakness (generalized) (M62.81)    Time: [id number] PT Time Calculation (min) (ACUTE ONLY): 16 min   Charges:   PT Evaluation $PT Eval Moderate Complexity: Trigg, PT, DPT 10/12/20, 2:07 PM   Krystal Goodwin 10/12/2020, 2:05 PM

## 2020-10-12 NOTE — Plan of Care (Signed)

## 2020-10-12 NOTE — Progress Notes (Signed)
Monitor PROGRESS NOTE    Krystal Goodwin  [id number] DOB: 1933-03-30 DOA: 10/08/2020 PCP: Derinda Late, MD   Chief Complain: Low hemoglobin  Brief Narrative:  Patient is a 87 year old female with history of dementia, diabetes type 2, hypertension, aortic stenosis status post aortic valve repair, asthma, colon cancer status post remote partial colectomy, iron deficiency anemia, atrial fibrillation on Eliquis, hyperlipidemia, gout, hypertension, hypothyroidism, OSA noncompliant with CPAP who presented from skilled nursing facility for the evaluation of low hemoglobin.  At baseline, she is not oriented to time.  GI was consulted after admission.  She underwent EGD/colonoscopy and found to have hiatal hernia, Cameron ulcer.  Eventual plan is to discharge back to skilled  nursing facility after GI clearance.  Assessment & Plan:   Active Problems:   GI bleed   Iron deficiency anemia   Acute blood loss anemia/iron deficiency anemia: Report of melena at nursing facility.  CT angiogram of the abdomen/pelvis did not show any acute bleeding.  Hemoglobin was 4.7 on presentation, status post 3 units of blood transfusion in total.  Hemoglobin stable in the range of 7 today.   Underwent EGD/colonoscopy, found to have normal colon, found to have 4 cm hiatal hernia with Lysbeth Galas ulcer.  Continue monitor H&H. Inconclusive video capsule study as per GI.  GI recommended to restart Eliquis.  GI recommended to continue PPI indefinitely twice a day. Iron studies had shown low iron, being given another dose of iron infusion today.  She needs to be on oral supplementation on discharge. Her baseline hemoglobin fluctuates in between 9-13.  AKI on CKD stage IIIb: Kidney function improving currently around baseline.  Losartan on hold.  Monitor kidney function  Paroxysmal A. fib/history of heart block status post pacemaker: Currently in normal sinus rhythm.  On blood pressure Toprol 5 mg twice a day.  On Eliquis for  anticoagulation  History of asthma: Currently wheezing.  On room air.  Started on low-dose prednisone, DuoNeb.  She needs albuterol inhaler on discharge  Chronic diastolic congestive heart failure: On Lasix 20 daily at home.  Had mild edema lower extremities on presentation.  Monitor volume status.  Hypokalemia: Supplemented with potassium.  Diet-controlled diabetes type 2: Hemoglobin A1c of 5.5.  Hyperlipidemia: On statin.  Hypothyroidism: On Synthyroid  Debility/deconditioning/dementia/goals of care: Skilled nursing facility resident.  Delirium precautions.  Advanced age with poor quality of life.  Daughter was interested to talk to palliative care.  Palliative care consultation placed.She is a full code          DVT prophylaxis:Eliquis Code Status: Full Family Communication: Daughter at bedside Status is: Inpatient  Remains inpatient appropriate because:Inpatient level of care appropriate due to severity of illness  Dispo: The patient is from: SNF              Anticipated d/c is to: SNF              Patient currently is not medically stable to d/c.   Difficult to place patient No    Consultants: GI  Procedures: EGD, colonoscopy, video capsule study  Antimicrobials:  Anti-infectives (From admission, onward)    Start     Dose/Rate Route Frequency Ordered Stop   10/09/20 1900  cefTRIAXone (ROCEPHIN) 1 g in sodium chloride 0.9 % 100 mL IVPB  Status:  Discontinued        1 g 200 mL/hr over 30 Minutes Intravenous Every 24 hours 10/09/20 1813 10/11/20 1726       Subjective: Patient seen  and examined the bedside this morning.  Likely stable.  Sitting on the bed.  Daughter at the bedside.  Complains of wheezing today.  Denies any abdominal pain nausea or vomiting.  Objective: Vitals:   10/11/20 1949 10/11/20 2325 10/12/20 0428 10/12/20 0822  BP: (!) 131/47 (!) 130/51 (!) 136/49 (!) 133/43  Pulse: 78 70 71 71  Resp: '18 19 16   '$ Temp: 97.6 F (36.4 C) 97.8 F (36.6  C) (!) 97.5 F (36.4 C) 97.7 F (36.5 C)  TempSrc:      SpO2: 97% 100% 94% 99%  Weight:   88.2 kg   Height:        Intake/Output Summary (Last 24 hours) at 10/12/2020 0931 Last data filed at 10/12/2020 0530 Gross per 24 hour  Intake --  Output 2300 ml  Net -2300 ml   Filed Weights   10/08/20 1752 10/11/20 0658 10/12/20 0428  Weight: 88.5 kg 88.6 kg 88.2 kg    Examination:  General exam: Overall comfortable, not in distress, pleasant elderly female HEENT: PERRL Respiratory system: Bilateral expiratory wheezing Cardiovascular system: S1 & S2 heard, RRR.  Gastrointestinal system: Abdomen is nondistended, soft and nontender. Central nervous system: Alert and wake but not oriented Extremities: No edema, no clubbing ,no cyanosis Skin: No rashes, no ulcers,no icterus     Data Reviewed: I have personally reviewed following labs and imaging studies  CBC: Recent Labs  Lab 10/08/20 1757 10/09/20 0019 10/09/20 0625 10/09/20 1100 10/09/20 1738 10/10/20 0835 10/10/20 1723 10/11/20 0453 10/12/20 0522  WBC 7.1  --   --   --   --   --   --  7.0 6.9  HGB 4.7*   < > 7.6* 7.4* 7.5* 7.9* 7.1* 7.1* 7.9*  HCT 16.9*   < > 25.1* 23.6* 23.8*  --   --  24.0* 24.5*  MCV 78.2*  --   --   --   --   --   --  82.2 81.7  PLT 203  --   --   --   --   --   --  178 156   < > = values in this interval not displayed.   Basic Metabolic Panel: Recent Labs  Lab 10/08/20 1757 10/09/20 0625 10/10/20 0449 10/11/20 0453 10/12/20 0522  NA 136 138 142 140 140  K 4.7 4.0 4.3 3.4* 3.2*  CL 110 110 113* 113* 113*  CO2 18* '22 24 22 24  '$ GLUCOSE 94 95 143* 97 96  BUN 37* 34* 27* 21 14  CREATININE 1.60* 1.50* 1.34* 1.31* 1.15*  CALCIUM 8.8* 8.6* 8.7* 8.8* 8.5*  MG  --   --  2.0  --  1.7   GFR: Estimated Creatinine Clearance: 39.2 mL/min (A) (by C-G formula based on SCr of 1.15 mg/dL (H)). Liver Function Tests: Recent Labs  Lab 10/08/20 1757 10/09/20 0625  AST 28 19  ALT 13 13  ALKPHOS 65 57   BILITOT 0.6 1.1  PROT 6.3* 6.0*  ALBUMIN 3.3* 2.9*   No results for input(s): LIPASE, AMYLASE in the last 168 hours. No results for input(s): AMMONIA in the last 168 hours. Coagulation Profile: Recent Labs  Lab 10/09/20 0019  INR 1.8*   Cardiac Enzymes: No results for input(s): CKTOTAL, CKMB, CKMBINDEX, TROPONINI in the last 168 hours. BNP (last 3 results) No results for input(s): PROBNP in the last 8760 hours. HbA1C: No results for input(s): HGBA1C in the last 72 hours. CBG: Recent Labs  Lab 10/11/20  1613 10/11/20 1949 10/11/20 2327 10/12/20 0429 10/12/20 0827  GLUCAP 80 95 92 103* 87   Lipid Profile: No results for input(s): CHOL, HDL, LDLCALC, TRIG, CHOLHDL, LDLDIRECT in the last 72 hours. Thyroid Function Tests: No results for input(s): TSH, T4TOTAL, FREET4, T3FREE, THYROIDAB in the last 72 hours. Anemia Panel: Recent Labs    10/09/20 1100 10/09/20 1425  VITAMINB12  --  392  FOLATE 42.0  --    Sepsis Labs: No results for input(s): PROCALCITON, LATICACIDVEN in the last 168 hours.  Recent Results (from the past 240 hour(s))  Resp Panel by RT-PCR (Flu A&B, Covid) Nasopharyngeal Swab     Status: None   Collection Time: 10/08/20  8:52 PM   Specimen: Nasopharyngeal Swab; Nasopharyngeal(NP) swabs in vial transport medium  Result Value Ref Range Status   SARS Coronavirus 2 by RT PCR NEGATIVE NEGATIVE Final    Comment: (NOTE) SARS-CoV-2 target nucleic acids are NOT DETECTED.  The SARS-CoV-2 RNA is generally detectable in upper respiratory specimens during the acute phase of infection. The lowest concentration of SARS-CoV-2 viral copies this assay can detect is 138 copies/mL. A negative result does not preclude SARS-Cov-2 infection and should not be used as the sole basis for treatment or other patient management decisions. A negative result may occur with  improper specimen collection/handling, submission of specimen other than nasopharyngeal swab, presence of  viral mutation(s) within the areas targeted by this assay, and inadequate number of viral copies(<138 copies/mL). A negative result must be combined with clinical observations, patient history, and epidemiological information. The expected result is Negative.  Fact Sheet for Patients:  EntrepreneurPulse.com.au  Fact Sheet for Healthcare Providers:  IncredibleEmployment.be  This test is no t yet approved or cleared by the [place] FDA and  has been authorized for detection and/or diagnosis of SARS-CoV-2 by FDA under an Emergency Use Authorization (EUA). This EUA will remain  in effect (meaning this test can be used) for the duration of the COVID-19 declaration under Section 564(b)(1) of the Act, 21 U.S.C.section 360bbb-3(b)(1), unless the authorization is terminated  or revoked sooner.       Influenza A by PCR NEGATIVE NEGATIVE Final   Influenza B by PCR NEGATIVE NEGATIVE Final    Comment: (NOTE) The Xpert Xpress SARS-CoV-2/FLU/RSV plus assay is intended as an aid in the diagnosis of influenza from Nasopharyngeal swab specimens and should not be used as a sole basis for treatment. Nasal washings and aspirates are unacceptable for Xpert Xpress SARS-CoV-2/FLU/RSV testing.  Fact Sheet for Patients: EntrepreneurPulse.com.au  Fact Sheet for Healthcare Providers: IncredibleEmployment.be  This test is not yet approved or cleared by the [place] FDA and has been authorized for detection and/or diagnosis of SARS-CoV-2 by FDA under an Emergency Use Authorization (EUA). This EUA will remain in effect (meaning this test can be used) for the duration of the COVID-19 declaration under Section 564(b)(1) of the Act, 21 U.S.C. section 360bbb-3(b)(1), unless the authorization is terminated or revoked.  Performed at [facility], [address], [place], [place] [address]   Urine Culture     Status:  None   Collection Time: 10/09/20  5:32 PM   Specimen: Urine, Random  Result Value Ref Range Status   Specimen Description   Final    URINE, RANDOM Performed at [facility], [address], [place], [place] [address]    Special Requests   Final    NONE Performed at [facility], [address] [place],  [address]  Culture   Final    NO GROWTH Performed at [facility], [place] [address], [place], [place] [address]    Report Status 10/11/2020 FINAL  Final         Radiology Studies: DG Knee Complete 4 Views Left  Result Date: 10/10/2020 CLINICAL DATA:  LEFT knee pain of unspecified chronicity EXAM: LEFT KNEE - COMPLETE 4+ VIEW COMPARISON:  LEFT tibial and fibular radiographs 03/28/2020 FINDINGS: Osseous demineralization. Diffuse joint space narrowing greatest at lateral compartment with bone-on-bone appearance, spur formation, and subchondral sclerosis are identified. No acute fracture, dislocation, or bone destruction. Minimal spurring at cranial margin of patella. Slight genu valgus. IMPRESSION: Advanced degenerative changes LEFT knee primarily at lateral compartment. Electronically Signed   By: Lavonia Dana M.D.   On: 10/10/2020 14:58   DG Knee Complete 4 Views Right  Result Date: 10/10/2020 CLINICAL DATA:  Chronic RIGHT knee pain EXAM: RIGHT KNEE - COMPLETE 4+ VIEW COMPARISON:  None FINDINGS: Osseous demineralization. Tricompartmental joint space narrowing greatest at lateral compartment where bone-on-bone appearance is seen. Minimal lateral compartment spur formation. No fracture, dislocation, bone destruction, or joint effusion. IMPRESSION: Osseous demineralization with degenerative changes greatest at lateral compartment. No acute abnormalities. Electronically Signed   By: Lavonia Dana M.D.   On: 10/10/2020 15:00        Scheduled Meds:  sodium chloride   Intravenous Once   atorvastatin  10 mg Oral Daily   diclofenac Sodium  4 g Topical QID    levothyroxine  100 mcg Oral Q0600   metoprolol tartrate  12.5 mg Oral BID   pantoprazole  40 mg Intravenous Q12H   polyethylene glycol  17 g Oral Daily   sodium chloride flush  3 mL Intravenous Q12H   Continuous Infusions:   LOS: 4 days    Time spent: 35 mins.More than 50% of that time was spent in counseling and/or coordination of care.      Shelly Coss, MD [facility] P9/06/2020, 9:31 AM

## 2020-10-12 NOTE — Progress Notes (Signed)
Krystal Darby, MD [address]  [place]  [place], [place] [address]  Main: [phone]  Fax: [phone] Pager: [phone]   Subjective: Patient is much more alert today, denies any pain, has been tolerating p.o. well.  Her daughter is bedside   Objective: Vital signs in last 24 hours: Vitals:   10/11/20 2325 10/12/20 0428 10/12/20 0822 10/12/20 1140  BP: (!) 130/51 (!) 136/49 (!) 133/43 (!) 134/51  Pulse: 70 71 71 71  Resp: '19 16  18  '$ Temp: 97.8 F (36.6 C) (!) 97.5 F (36.4 C) 97.7 F (36.5 C) 98.4 F (36.9 C)  TempSrc:    Oral  SpO2: 100% 94% 99% 100%  Weight:  88.2 kg    Height:       Weight change: -0.454 kg  Intake/Output Summary (Last 24 hours) at 10/12/2020 1145 Last data filed at 10/12/2020 0950 Gross per 24 hour  Intake 360 ml  Output 2300 ml  Net -1940 ml     Exam: Heart:: Regular rate and rhythm, S1S2 present, or without murmur or extra heart sounds Lungs: Bilateral expiratory wheezing Abdomen: soft, nontender, normal bowel sounds   Lab Results: CBC Latest Ref Rng & Units 10/12/2020 10/11/2020 10/10/2020  WBC 4.0 - 10.5 K/uL 6.9 7.0 -  Hemoglobin 12.0 - 15.0 g/dL 7.9(L) 7.1(L) 7.1(L)  Hematocrit 36.0 - 46.0 % 24.5(L) 24.0(L) -  Platelets 150 - 400 K/uL 156 178 -   chondromalacia patella Iron/TIBC/Ferritin/ %Sat    Component Value Date/Time   IRON 12 (L) 10/09/2020 0019   TIBC 463 (H) 10/09/2020 0019   FERRITIN 4 (L) 10/09/2020 0019   IRONPCTSAT 3 (L) 10/09/2020 0019     Micro Results: Recent Results (from the past 240 hour(s))  Resp Panel by RT-PCR (Flu A&B, Covid) Nasopharyngeal Swab     Status: None   Collection Time: 10/08/20  8:52 PM   Specimen: Nasopharyngeal Swab; Nasopharyngeal(NP) swabs in vial transport medium  Result Value Ref Range Status   SARS Coronavirus 2 by RT PCR NEGATIVE NEGATIVE Final    Comment: (NOTE) SARS-CoV-2 target nucleic acids are NOT DETECTED.  The SARS-CoV-2 RNA is generally detectable in upper  respiratory specimens during the acute phase of infection. The lowest concentration of SARS-CoV-2 viral copies this assay can detect is 138 copies/mL. A negative result does not preclude SARS-Cov-2 infection and should not be used as the sole basis for treatment or other patient management decisions. A negative result may occur with  improper specimen collection/handling, submission of specimen other than nasopharyngeal swab, presence of viral mutation(s) within the areas targeted by this assay, and inadequate number of viral copies(<138 copies/mL). A negative result must be combined with clinical observations, patient history, and epidemiological information. The expected result is Negative.  Fact Sheet for Patients:  EntrepreneurPulse.com.au  Fact Sheet for Healthcare Providers:  IncredibleEmployment.be  This test is no t yet approved or cleared by the [place] FDA and  has been authorized for detection and/or diagnosis of SARS-CoV-2 by FDA under an Emergency Use Authorization (EUA). This EUA will remain  in effect (meaning this test can be used) for the duration of the COVID-19 declaration under Section 564(b)(1) of the Act, 21 U.S.C.section 360bbb-3(b)(1), unless the authorization is terminated  or revoked sooner.       Influenza A by PCR NEGATIVE NEGATIVE Final   Influenza B by PCR NEGATIVE NEGATIVE Final    Comment: (NOTE) The Xpert Xpress SARS-CoV-2/FLU/RSV plus assay is intended as an aid  in the diagnosis of influenza from Nasopharyngeal swab specimens and should not be used as a sole basis for treatment. Nasal washings and aspirates are unacceptable for Xpert Xpress SARS-CoV-2/FLU/RSV testing.  Fact Sheet for Patients: EntrepreneurPulse.com.au  Fact Sheet for Healthcare Providers: IncredibleEmployment.be  This test is not yet approved or cleared by the [place] FDA and has been  authorized for detection and/or diagnosis of SARS-CoV-2 by FDA under an Emergency Use Authorization (EUA). This EUA will remain in effect (meaning this test can be used) for the duration of the COVID-19 declaration under Section 564(b)(1) of the Act, 21 U.S.C. section 360bbb-3(b)(1), unless the authorization is terminated or revoked.  Performed at [facility], [address], [place], [place] [address]   Urine Culture     Status: None   Collection Time: 10/09/20  5:32 PM   Specimen: Urine, Random  Result Value Ref Range Status   Specimen Description   Final    URINE, RANDOM Performed at [facility], [address] [place], [place] [address]    Special Requests   Final    NONE Performed at [facility], [address], [place], [place] [address]    Culture   Final    NO GROWTH Performed at [facility], [place] [address], [place], [place] [address]    Report Status 10/11/2020 FINAL  Final   Studies/Results: DG Knee Complete 4 Views Left  Result Date: 10/10/2020 CLINICAL DATA:  LEFT knee pain of unspecified chronicity EXAM: LEFT KNEE - COMPLETE 4+ VIEW COMPARISON:  LEFT tibial and fibular radiographs 03/28/2020 FINDINGS: Osseous demineralization. Diffuse joint space narrowing greatest at lateral compartment with bone-on-bone appearance, spur formation, and subchondral sclerosis are identified. No acute fracture, dislocation, or bone destruction. Minimal spurring at cranial margin of patella. Slight genu valgus. IMPRESSION: Advanced degenerative changes LEFT knee primarily at lateral compartment. Electronically Signed   By: Lavonia Dana M.D.   On: 10/10/2020 14:58   DG Knee Complete 4 Views Right  Result Date: 10/10/2020 CLINICAL DATA:  Chronic RIGHT knee pain EXAM: RIGHT KNEE - COMPLETE 4+ VIEW COMPARISON:  None FINDINGS: Osseous demineralization. Tricompartmental joint space narrowing greatest at lateral compartment where bone-on-bone appearance is  seen. Minimal lateral compartment spur formation. No fracture, dislocation, bone destruction, or joint effusion. IMPRESSION: Osseous demineralization with degenerative changes greatest at lateral compartment. No acute abnormalities. Electronically Signed   By: Lavonia Dana M.D.   On: 10/10/2020 15:00   Medications: I have reviewed the patient's current medications. Prior to Admission:  Medications Prior to Admission  Medication Sig Dispense Refill Last Dose   acetaminophen (TYLENOL) 650 MG CR tablet Take 650 mg by mouth 3 (three) times daily.   10/08/2020 at 1400   atorvastatin (LIPITOR) 10 MG tablet Take 10 mg by mouth daily.   10/08/2020 at 0800   cetirizine (ZYRTEC) 10 MG tablet Take 10 mg by mouth daily.   10/08/2020 at 0800   Cholecalciferol (VITAMIN D3) 50 MCG (2000 UT) TABS Take 2,000 Units by mouth daily at 12 noon.   10/08/2020 at 0800   citalopram (CELEXA) 20 MG tablet Take 20 mg by mouth daily.   10/08/2020 at 0800   diclofenac Sodium (VOLTAREN) 1 % GEL Apply 4 g topically 4 (four) times daily.   10/08/2020 at 1200   ELIQUIS 5 MG TABS tablet Take 5 mg by mouth 2 (two) times daily.   10/08/2020 at 0800   furosemide (LASIX) 20 MG tablet Take 20 mg by  mouth daily.   10/08/2020   HYDROcodone-acetaminophen (NORCO) 7.5-325 MG tablet Take 1 tablet by mouth 3 (three) times daily.   10/08/2020 at 1400   levothyroxine (SYNTHROID) 100 MCG tablet Take 100 mcg by mouth daily before breakfast.    10/07/2020 at 2000   melatonin 5 MG TABS Take 5 mg by mouth at bedtime.   10/07/2020 at 2000   metoprolol succinate (TOPROL-XL) 25 MG 24 hr tablet Take 25 mg by mouth daily.   10/08/2020 at 0800   Multiple Vitamin (MULTIVITAMIN) tablet Take 1 tablet by mouth daily.   10/07/2020 at 2000   senna-docusate (SENOKOT-S) 8.6-50 MG tablet Take 2 tablets by mouth at bedtime.   10/07/2020 at 2000   ALPRAZolam (XANAX) 0.25 MG tablet Take 1-2 tablets (0.25-0.5 mg total) by mouth every 4 (four) hours as needed for anxiety or sleep. (Patient not  taking: No sig reported) 5 tablet 0 Not Taking   ferrous sulfate 325 (65 FE) MG tablet Take 325 mg by mouth daily with breakfast. (Patient not taking: No sig reported)   Not Taking   HYDROcodone-acetaminophen (NORCO/VICODIN) 5-325 MG tablet Take 1 tablet by mouth every 4 (four) hours as needed for moderate pain. (Patient not taking: No sig reported) 42 tablet 0 Not Taking   losartan (COZAAR) 25 MG tablet Take 1 tablet (25 mg total) by mouth daily. (Patient not taking: No sig reported) 30 tablet 0 Not Taking   Melatonin-Pyridoxine 1-10 MG TABS Take by mouth at bedtime as needed. (Patient not taking: No sig reported)   Not Taking   pantoprazole (PROTONIX) 40 MG tablet Take 40 mg by mouth daily. (Patient not taking: No sig reported)   Not Taking   traMADol (ULTRAM) 50 MG tablet Take 1 tablet (50 mg total) by mouth every 6 (six) hours as needed for moderate pain. (Patient not taking: No sig reported) 30 tablet 0 Not Taking   Scheduled:  sodium chloride   Intravenous Once   apixaban  5 mg Oral BID   atorvastatin  10 mg Oral Daily   diclofenac Sodium  4 g Topical QID   [START ON 10/13/2020] ferrous sulfate  325 mg Oral Q breakfast   hydrocortisone   Topical TID   ipratropium-albuterol  3 mL Nebulization Q6H   levothyroxine  100 mcg Oral Q0600   metoprolol tartrate  12.5 mg Oral BID   pantoprazole  40 mg Oral BID   polyethylene glycol  17 g Oral Daily   predniSONE  20 mg Oral Q breakfast   sodium chloride flush  3 mL Intravenous Q12H   Continuous: [id number], cyclobenzaprine, levalbuterol, morphine injection Anti-infectives (From admission, onward)    Start     Dose/Rate Route Frequency Ordered Stop   10/09/20 1900  cefTRIAXone (ROCEPHIN) 1 g in sodium chloride 0.9 % 100 mL IVPB  Status:  Discontinued        1 g 200 mL/hr over 30 Minutes Intravenous Every 24 hours 10/09/20 1813 10/11/20 1726      Scheduled Meds:  sodium chloride   Intravenous Once   apixaban  5 mg Oral BID    atorvastatin  10 mg Oral Daily   diclofenac Sodium  4 g Topical QID   [START ON 10/13/2020] ferrous sulfate  325 mg Oral Q breakfast   hydrocortisone   Topical TID   ipratropium-albuterol  3 mL Nebulization Q6H   levothyroxine  100 mcg Oral Q0600   metoprolol tartrate  12.5 mg Oral BID   pantoprazole  40  mg Oral BID   polyethylene glycol  17 g Oral Daily   predniSONE  20 mg Oral Q breakfast   sodium chloride flush  3 mL Intravenous Q12H   Continuous Infusions: PRN Meds:.ALPRAZolam, cyclobenzaprine, levalbuterol, morphine injection   Assessment: Active Problems:   GI bleed   Iron deficiency anemia  Plan: Chronic severe iron deficiency anemia with no evidence of active GI bleed Upper endoscopy revealed moderate hiatal hernia with Lysbeth Galas ulcer.  Colonoscopy was unremarkable except for ilio colonic anastomosis.  Normal neoterminal ileum Video capsule endoscopy was inconclusive Restart Eliquis today Recommend Protonix 40 mg twice daily indefinitely due to presence of hiatal hernia Recommend parenteral iron therapy Close follow-up with PCP to recheck CBC in 1 week Follow-up with me in 4 to 6 weeks  GI will sign off at this time, please call us back with questions or concerns   LOS: 4 days   Chantal Worthey 10/12/2020, 11:45 AM

## 2020-10-13 ENCOUNTER — Encounter: Payer: Self-pay | Admitting: Gastroenterology

## 2020-10-13 DIAGNOSIS — Z515 Encounter for palliative care: Secondary | ICD-10-CM

## 2020-10-13 DIAGNOSIS — Z7189 Other specified counseling: Secondary | ICD-10-CM

## 2020-10-13 LAB — GLUCOSE, CAPILLARY
Glucose-Capillary: 110 mg/dL — ABNORMAL HIGH (ref 70–99)
Glucose-Capillary: 97 mg/dL (ref 70–99)
Glucose-Capillary: 119 mg/dL — ABNORMAL HIGH (ref 70–99)

## 2020-10-13 LAB — BASIC METABOLIC PANEL
Anion gap: 4 — ABNORMAL LOW (ref 5–15)
BUN: 14 mg/dL (ref 8–23)
CO2: 24 mmol/L (ref 22–32)
Calcium: 9 mg/dL (ref 8.9–10.3)
Chloride: 112 mmol/L — ABNORMAL HIGH (ref 98–111)
Creatinine, Ser: 1.04 mg/dL — ABNORMAL HIGH (ref 0.44–1.00)
GFR, Estimated: 53 mL/min — ABNORMAL LOW (ref >60)
Glucose, Bld: 108 mg/dL — ABNORMAL HIGH (ref 70–99)
Potassium: 3.2 mmol/L — ABNORMAL LOW (ref 3.5–5.1)
Sodium: 140 mmol/L (ref 135–145)

## 2020-10-13 LAB — CBC WITH DIFFERENTIAL/PLATELET
Abs Immature Granulocytes: 0.12 10*3/uL — ABNORMAL HIGH (ref 0.00–0.07)
Basophils Absolute: 0 10*3/uL (ref 0.0–0.1)
Basophils Relative: 0 %
Eosinophils Absolute: 0 10*3/uL (ref 0.0–0.5)
Eosinophils Relative: 0 %
HCT: 26.6 % — ABNORMAL LOW (ref 36.0–46.0)
Hemoglobin: 8.4 g/dL — ABNORMAL LOW (ref 12.0–15.0)
Immature Granulocytes: 2 %
Lymphocytes Relative: 13 %
Lymphs Abs: 1 10*3/uL (ref 0.7–4.0)
MCH: 25.3 pg — ABNORMAL LOW (ref 26.0–34.0)
MCHC: 31.6 g/dL (ref 30.0–36.0)
MCV: 80.1 fL (ref 80.0–100.0)
Monocytes Absolute: 1.1 10*3/uL — ABNORMAL HIGH (ref 0.1–1.0)
Monocytes Relative: 14 %
Neutro Abs: 5.5 10*3/uL (ref 1.7–7.7)
Neutrophils Relative %: 71 %
Platelets: 164 10*3/uL (ref 150–400)
RBC: 3.32 MIL/uL — ABNORMAL LOW (ref 3.87–5.11)
RDW: 18.7 % — ABNORMAL HIGH (ref 11.5–15.5)
WBC: 7.7 10*3/uL (ref 4.0–10.5)
nRBC: 0.4 % — ABNORMAL HIGH (ref 0.0–0.2)

## 2020-10-13 LAB — RESP PANEL BY RT-PCR (FLU A&B, COVID) ARPGX2
Influenza A by PCR: NEGATIVE
Influenza B by PCR: NEGATIVE
SARS Coronavirus 2 by RT PCR: NEGATIVE

## 2020-10-13 MED ORDER — FERROUS SULFATE 325 (65 FE) MG PO TABS: 325.0000 mg | ORAL_TABLET | Freq: Every day | ORAL | 0 refills | Status: DC

## 2020-10-13 MED ORDER — POTASSIUM CHLORIDE CRYS ER 20 MEQ PO TBCR
40.0000 meq | EXTENDED_RELEASE_TABLET | Freq: Once | ORAL | Status: AC
  Administered 2020-10-13: 40 meq via ORAL
  Filled 2020-10-13: qty 4

## 2020-10-13 MED ORDER — HYDROCORTISONE 1 % EX OINT: TOPICAL_OINTMENT | Freq: Three times a day (TID) | CUTANEOUS | 0 refills | Status: DC

## 2020-10-13 MED ORDER — PREDNISONE 20 MG PO TABS: 20.0000 mg | ORAL_TABLET | Freq: Every day | ORAL | 0 refills | Status: AC

## 2020-10-13 MED ORDER — POLYETHYLENE GLYCOL 3350 17 G PO PACK: 17.0000 g | PACK | Freq: Every day | ORAL | 0 refills | Status: DC

## 2020-10-13 MED ORDER — POTASSIUM CHLORIDE CRYS ER 20 MEQ PO TBCR: 20.0000 meq | EXTENDED_RELEASE_TABLET | Freq: Every day | ORAL | 0 refills | Status: DC

## 2020-10-13 MED ORDER — ALBUTEROL SULFATE HFA 108 (90 BASE) MCG/ACT IN AERS: 2.0000 | INHALATION_SPRAY | Freq: Four times a day (QID) | RESPIRATORY_TRACT | 2 refills | Status: DC | PRN

## 2020-10-13 MED ORDER — HYDROCODONE-ACETAMINOPHEN 7.5-325 MG PO TABS: 1.0000 | ORAL_TABLET | Freq: Four times a day (QID) | ORAL | 0 refills | Status: DC | PRN

## 2020-10-13 MED ORDER — PANTOPRAZOLE SODIUM 40 MG PO TBEC: 40.0000 mg | DELAYED_RELEASE_TABLET | Freq: Two times a day (BID) | ORAL | 0 refills | Status: DC

## 2020-10-13 NOTE — Progress Notes (Signed)
Talked to [place], Therapist, sports at [facility].  Gave update.  Included AVS discharge instructions in with transfer papers.   Daughter at bedside when patient was transferred.

## 2020-10-13 NOTE — Progress Notes (Addendum)
Mobility Specialist - Progress Note   10/13/20 1300  Mobility  Activity Transferred:  Bed to chair  Level of Assistance Moderate assist, patient does 50-74%  Assistive Device None (+2)  Distance Ambulated (ft) 3 ft  Mobility Out of bed to chair with meals  Mobility Response Tolerated well  Mobility performed by Mobility specialist  $Mobility charge 1 Mobility    Pt transferred bed-chair with +2 assist from NT. Does voice pain in LLE. VC for sequencing. Easily distracted. Assist to scooting into chair. Alarm set.    Kathee Delton Mobility Specialist 10/13/20, 1:05 PM

## 2020-10-13 NOTE — TOC Transition Note (Addendum)
Transition of Care [facility]) - CM/SW Discharge Note   Patient Details  Name: BRITTANNY LIMBAUGH MRN: [id number] Date of Birth: 1934-01-04  Transition of Care [facility]) CM/SW Contact:  Eileen Stanford, LCSW Phone Number: 10/13/2020, 2:39 PM   Clinical Narrative:   Clinical Social Worker facilitated patient discharge including contacting patient family and facility to confirm patient discharge plans.  Clinical information faxed to facility and family agreeable with plan.  CSW arranged ambulance transport via [place] to [facility] (room 410) .  RN to call [phone] for report prior to discharge.     Final next level of care: Skilled Nursing Facility Barriers to Discharge: No Barriers Identified   Patient Goals and CMS Choice   CMS Medicare.gov Compare Post Acute Care list provided to:: Patient Represenative (must comment) (daughter Mitchell Heir) Choice offered to / list presented to : Adult Children  Discharge Placement                Patient to be transferred to facility by: [facility]   Patient and family notified of of transfer: 10/13/20  Discharge Plan and Services     Post Acute Care Choice: [facility]                               Social Determinants of Health (SDOH) Interventions     Readmission Risk Interventions No flowsheet data found.

## 2020-10-13 NOTE — Discharge Summary (Signed)
Physician Discharge Summary  Krystal Goodwin [id number] DOB: 1933/04/05 DOA: 10/08/2020  PCP: Derinda Late, MD  Admit date: 10/08/2020 Discharge date: 10/13/2020  Admitted From: SNF Disposition:  SNF  Discharge Condition:Stable CODE STATUS:DNR Diet recommendation: Heart Healthy  Brief/Interim Summary:  Patient is a 87 year old female with history of dementia, diabetes type 2, hypertension, aortic stenosis status post aortic valve repair, asthma, colon cancer status post remote partial colectomy, iron deficiency anemia, atrial fibrillation on Eliquis, hyperlipidemia, gout, hypertension, hypothyroidism, OSA noncompliant with CPAP who presented from skilled nursing facility for the evaluation of low hemoglobin.  At baseline, she is not oriented to time.  GI was consulted after admission.  She underwent EGD/colonoscopy and found to have hiatal hernia, Cameron ulcer.  Video capsule endoscopy did not show any significant finding.  Started on PPI twice daily.  GI has cleared for discharge.  She was also seen by palliative care during this hospitalization, CODE STATUS changed to DNR.  We recommend to follow-up with palliative care/hospice at the skilled nursing facility.  After discussion with palliative care, no return to hospital planned in the future.  Following problems were addressed during her hospitalization:  Acute blood loss anemia/iron deficiency anemia: Report of melena at nursing facility.  CT angiogram of the abdomen/pelvis did not show any acute bleeding.  Hemoglobin was 4.7 on presentation, status post 3 units of blood transfusion in total.  Hemoglobin stable in the range of 8 today.   Underwent EGD/colonoscopy, found to have normal colon, found to have 4 cm hiatal hernia with Lysbeth Galas ulcer. Inconclusive video capsule study as per GI.  GI recommended to restart Eliquis.  GI recommended to continue PPI indefinitely twice a day. Iron studies had shown low iron, given few doses  of iv iron  infusion .  She needs to be on oral supplementation on discharge. Her baseline hemoglobin fluctuates in between 9-13.   AKI on CKD stage IIIb: Kidney function improved, currently around baseline.     Paroxysmal A. fib/history of heart block status post pacemaker: Currently in normal sinus rhythm.  On lopressor  On Eliquis for anticoagulation   History of asthma: Found to be wheezing on 10/12/20.On room air.  Started on low-dose prednisone.  She needs albuterol inhaler on discharge   Chronic diastolic congestive heart failure: On Lasix 20 daily at home.  Had mild edema lower extremities on presentation.     Hypokalemia: Supplemented with potassium.   Diet-controlled diabetes type 2: Hemoglobin A1c of 5.5.   Hyperlipidemia: On statin.   Hypothyroidism: On Synthyroid   Debility/deconditioning/dementia/goals of care: Skilled nursing facility resident. .  Advanced age with poor quality of life.   Palliative care evaluated the patient.  CODE STATUS changed to DNR.  After discussion with palliative care, she will follow-up with hospice/palliative care at [facility].  Decision was made not to bring her back to the hospital in the future   Discharge Diagnoses:  Active Problems:   GI bleed   Iron deficiency anemia    Discharge Instructions  Discharge Instructions     Diet - low sodium heart healthy   Complete by: As directed    Discharge instructions   Complete by: As directed    1)Please take prescribed medications as instucted 2)Follow up with gastroenterology in 4 weeks.  Name and number the provider has been attached 3)Do CBC,BMP  tests in a week to check your hemoglobin and potassium level 4)Follow up with palliative care/hospice at [facility] facility.   Increase activity slowly  Complete by: As directed       Allergies as of 10/13/2020       Reactions   Amoxicillin Itching, Rash   Breaks out in a rash On hands On hands   Erythromycin Nausea And Vomiting, Other (See Comments)    Vicodin [hydrocodone-acetaminophen] Nausea And Vomiting        Medication List     STOP taking these medications    ALPRAZolam 0.25 MG tablet Commonly known as: XANAX   HYDROcodone-acetaminophen 5-325 MG tablet Commonly known as: NORCO/VICODIN   HYDROcodone-acetaminophen 7.5-325 MG tablet Commonly known as: NORCO   losartan 25 MG tablet Commonly known as: COZAAR   melatonin-pyridoxine 1-10 MG Tabs   traMADol 50 MG tablet Commonly known as: ULTRAM       TAKE these medications    acetaminophen 650 MG CR tablet Commonly known as: TYLENOL Take 650 mg by mouth 3 (three) times daily.   albuterol 108 (90 Base) MCG/ACT inhaler Commonly known as: VENTOLIN HFA Inhale 2 puffs into the lungs every 6 (six) hours as needed for wheezing or shortness of breath.   atorvastatin 10 MG tablet Commonly known as: LIPITOR Take 10 mg by mouth daily.   cetirizine 10 MG tablet Commonly known as: ZYRTEC Take 10 mg by mouth daily.   citalopram 20 MG tablet Commonly known as: CELEXA Take 20 mg by mouth daily.   diclofenac Sodium 1 % Gel Commonly known as: VOLTAREN Apply 4 g topically 4 (four) times daily.   Eliquis 5 MG Tabs tablet Generic drug: apixaban Take 5 mg by mouth 2 (two) times daily.   ferrous sulfate 325 (65 FE) MG tablet Take 1 tablet (325 mg total) by mouth daily with breakfast.   furosemide 20 MG tablet Commonly known as: LASIX Take 20 mg by mouth daily.   hydrocortisone 1 % ointment Apply topically 3 (three) times daily.   levothyroxine 100 MCG tablet Commonly known as: SYNTHROID Take 100 mcg by mouth daily before breakfast.   melatonin 5 MG Tabs Take 5 mg by mouth at bedtime.   metoprolol succinate 25 MG 24 hr tablet Commonly known as: TOPROL-XL Take 25 mg by mouth daily.   multivitamin tablet Take 1 tablet by mouth daily.   pantoprazole 40 MG tablet Commonly known as: PROTONIX Take 1 tablet (40 mg total) by mouth 2 (two) times daily. What  changed: when to take this   polyethylene glycol 17 g packet Commonly known as: MIRALAX / GLYCOLAX Take 17 g by mouth daily. Start taking on: October 14, 2020   potassium chloride SA 20 MEQ tablet Commonly known as: KLOR-CON Take 1 tablet (20 mEq total) by mouth daily.   predniSONE 20 MG tablet Commonly known as: DELTASONE Take 1 tablet (20 mg total) by mouth daily with breakfast for 1 day. Start taking on: October 14, 2020   senna-docusate 8.6-50 MG tablet Commonly known as: Senokot-S Take 2 tablets by mouth at bedtime.   Vitamin D3 50 MCG (2000 UT) Tabs Take 2,000 Units by mouth daily at 12 noon.        Follow-up Information     Vanga, Tally Due, MD. Schedule an appointment as soon as possible for a visit in 4 week(s).   Specialty: Gastroenterology Contact information: [place] [address] [phone]                Allergies  Allergen Reactions   Amoxicillin Itching and Rash    Breaks out in a rash  On hands On hands    Erythromycin Nausea And Vomiting and Other (See Comments)   Vicodin [Hydrocodone-Acetaminophen] Nausea And Vomiting    Consultations: GI,Palliative care   Procedures/Studies: DG Chest Port 1 View  Result Date: 10/09/2020 CLINICAL DATA:  Shortness of breath EXAM: PORTABLE CHEST 1 VIEW COMPARISON:  Radiograph 09/04/2019 FINDINGS: Unchanged cardiomediastinal silhouette with prior median sternotomy, aortic valve replacement, and dual chamber pacemaker leads. There is no new focal airspace disease. There is no large pleural effusion or visible pneumothorax. No acute osseous abnormality. Unchanged appearance of the right glenoid. Right upper quadrant surgical clips, likely prior cholecystectomy. Unchanged calcification overlying the right axilla. IMPRESSION: No evidence of acute cardiopulmonary disease. Electronically Signed   By: Maurine Simmering M.D.   On: 10/09/2020 15:07   DG Knee Complete 4 Views Left  Result Date:  10/10/2020 CLINICAL DATA:  LEFT knee pain of unspecified chronicity EXAM: LEFT KNEE - COMPLETE 4+ VIEW COMPARISON:  LEFT tibial and fibular radiographs 03/28/2020 FINDINGS: Osseous demineralization. Diffuse joint space narrowing greatest at lateral compartment with bone-on-bone appearance, spur formation, and subchondral sclerosis are identified. No acute fracture, dislocation, or bone destruction. Minimal spurring at cranial margin of patella. Slight genu valgus. IMPRESSION: Advanced degenerative changes LEFT knee primarily at lateral compartment. Electronically Signed   By: Lavonia Dana M.D.   On: 10/10/2020 14:58   DG Knee Complete 4 Views Right  Result Date: 10/10/2020 CLINICAL DATA:  Chronic RIGHT knee pain EXAM: RIGHT KNEE - COMPLETE 4+ VIEW COMPARISON:  None FINDINGS: Osseous demineralization. Tricompartmental joint space narrowing greatest at lateral compartment where bone-on-bone appearance is seen. Minimal lateral compartment spur formation. No fracture, dislocation, bone destruction, or joint effusion. IMPRESSION: Osseous demineralization with degenerative changes greatest at lateral compartment. No acute abnormalities. Electronically Signed   By: Lavonia Dana M.D.   On: 10/10/2020 15:00   ECHOCARDIOGRAM COMPLETE  Result Date: 10/09/2020    ECHOCARDIOGRAM REPORT   Patient Name:   Krystal Goodwin Date of Exam: 10/09/2020 Medical Rec #:  [id number]    Height:       65.0 in Accession #:    [id number]   Weight:       195.1 lb Date of Birth:  12/26/1933     BSA:          1.957 m Patient Age:    43 years     BP:           128/46 mmHg Patient Gender: F            HR:           78 bpm. Exam Location:  [facility] Procedure: 2D Echo, Color Doppler and Cardiac Doppler Indications:     I50.9 Congestive Heart Failure  History:         Patient has no prior history of Echocardiogram examinations.                  Aortic Valve Disease; Risk Factors:Diabetes, Dyslipidemia,                  Hypertension and Sleep Apnea. Asthma.   Sonographer:     Charmayne Sheer Referring Phys:  [id number] [facility] A THOMAS Diagnosing Phys: Neoma Laming  Sonographer Comments: Suboptimal parasternal window and no subcostal window. IMPRESSIONS  1. Left ventricular ejection fraction, by estimation, is 50 to 55%. The left ventricle has low normal function. The left ventricle has no regional wall motion abnormalities. There is mild concentric left ventricular hypertrophy. Left ventricular  diastolic parameters are consistent with Grade II diastolic dysfunction (pseudonormalization).  2. Right ventricular systolic function is mildly reduced. The right ventricular size is mildly enlarged.  3. Left atrial size was mildly dilated.  4. Right atrial size was mildly dilated.  5. The mitral valve is degenerative. Mild mitral valve regurgitation. No evidence of mitral stenosis. Moderate mitral annular calcification.  6. The aortic valve is calcified. Aortic valve regurgitation is trivial. Mild aortic valve sclerosis is present, with no evidence of aortic valve stenosis.  7. The inferior vena cava is normal in size with greater than 50% respiratory variability, suggesting right atrial pressure of 3 mmHg. FINDINGS  Left Ventricle: Left ventricular ejection fraction, by estimation, is 50 to 55%. The left ventricle has low normal function. The left ventricle has no regional wall motion abnormalities. The left ventricular internal cavity size was normal in size. There is mild concentric left ventricular hypertrophy. Left ventricular diastolic parameters are consistent with Grade II diastolic dysfunction (pseudonormalization). Right Ventricle: The right ventricular size is mildly enlarged. No increase in right ventricular wall thickness. Right ventricular systolic function is mildly reduced. Left Atrium: Left atrial size was mildly dilated. Right Atrium: Right atrial size was mildly dilated. Pericardium: There is no evidence of pericardial effusion. Mitral Valve: The mitral valve is  degenerative in appearance. Moderate mitral annular calcification. Mild mitral valve regurgitation. No evidence of mitral valve stenosis. MV peak gradient, 9.1 mmHg. The mean mitral valve gradient is 4.0 mmHg. Tricuspid Valve: The tricuspid valve is normal in structure. Tricuspid valve regurgitation is not demonstrated. No evidence of tricuspid stenosis. Aortic Valve: The aortic valve is calcified. Aortic valve regurgitation is trivial. Mild aortic valve sclerosis is present, with no evidence of aortic valve stenosis. Aortic valve mean gradient measures 16.0 mmHg. Aortic valve peak gradient measures 28.3  mmHg. Aortic valve area, by VTI measures 1.64 cm. Pulmonic Valve: The pulmonic valve was normal in structure. Pulmonic valve regurgitation is not visualized. No evidence of pulmonic stenosis. Aorta: The aortic root is normal in size and structure. Venous: The inferior vena cava is normal in size with greater than 50% respiratory variability, suggesting right atrial pressure of 3 mmHg. IAS/Shunts: No atrial level shunt detected by color flow Doppler.  LEFT VENTRICLE PLAX 2D LVIDd:         4.26 cm  Diastology LVIDs:         3.08 cm  LV e' medial:    5.77 cm/s LV PW:         0.98 cm  LV E/e' medial:  27.0 LV IVS:        0.68 cm  LV e' lateral:   10.20 cm/s LVOT diam:     1.90 cm  LV E/e' lateral: 15.3 LV SV:         96 LV SV Index:   49 LVOT Area:     2.84 cm  LEFT ATRIUM             Index LA diam:        4.30 cm 2.20 cm/m LA Vol (A2C):   26.4 ml 13.49 ml/m LA Vol (A4C):   42.9 ml 21.92 ml/m LA Biplane Vol: 36.9 ml 18.85 ml/m  AORTIC VALVE                    PULMONIC VALVE AV Area (Vmax):    1.47 cm     PV Vmax:       0.70 m/s AV Area (Vmean):  1.44 cm     PV Vmean:      48.800 cm/s AV Area (VTI):     1.64 cm     PV VTI:        0.118 m AV Vmax:           266.00 cm/s  PV Peak grad:  1.9 mmHg AV Vmean:          186.500 cm/s PV Mean grad:  1.0 mmHg AV VTI:            0.583 m AV Peak Grad:      28.3 mmHg AV Mean  Grad:      16.0 mmHg LVOT Vmax:         138.00 cm/s LVOT Vmean:        94.700 cm/s LVOT VTI:          0.338 m LVOT/AV VTI ratio: 0.58  AORTA Ao Root diam: 2.10 cm MITRAL VALVE MV Area (PHT): 4.46 cm     SHUNTS MV Area VTI:   2.16 cm     Systemic VTI:  0.34 m MV Peak grad:  9.1 mmHg     Systemic Diam: 1.90 cm MV Mean grad:  4.0 mmHg MV Vmax:       1.51 m/s MV Vmean:      91.9 cm/s MV Decel Time: 170 msec MV E velocity: 156.00 cm/s MV A velocity: 113.00 cm/s MV E/A ratio:  1.38 Shaukat Khan Electronically signed by Neoma Laming Signature Date/Time: 10/09/2020/3:08:16 PM    Final    CT Angio Abd/Pel W and/or Wo Contrast  Result Date: 10/08/2020 CLINICAL DATA:  Abnormal labs with low hemoglobin level. EXAM: CTA ABDOMEN AND PELVIS WITHOUT AND WITH CONTRAST TECHNIQUE: Multidetector CT imaging of the abdomen and pelvis was performed using the standard protocol during bolus administration of intravenous contrast. Multiplanar reconstructed images and MIPs were obtained and reviewed to evaluate the vascular anatomy. CONTRAST:  84m OMNIPAQUE IOHEXOL 350 MG/ML SOLN COMPARISON:  August 02, 2013 FINDINGS: VASCULAR Aorta: Marked severity calcification and atherosclerosis without aneurysm, dissection, vasculitis or significant stenosis. Celiac: Marked severity calcification and atherosclerosis without evidence of aneurysm, dissection, vasculitis or significant stenosis. SMA: Marked severity calcification and atherosclerosis without evidence of aneurysm, dissection, vasculitis or significant stenosis. Renals: Marked severity calcification and atherosclerosis without evidence of aneurysm, dissection, vasculitis, fibromuscular dysplasia or significant stenosis. IMA: Patent without evidence of aneurysm, dissection, vasculitis or significant stenosis. Inflow: Marked severity calcification and atherosclerosis without evidence of aneurysm, dissection, vasculitis or significant stenosis. Proximal Outflow: Bilateral common femoral and  visualized portions of the superficial and profunda femoral arteries are patent without evidence of aneurysm, dissection, vasculitis or significant stenosis. Veins: No obvious venous abnormality within the limitations of this arterial phase study. Review of the MIP images confirms the above findings. NON-VASCULAR Lower chest: No acute abnormality. Hepatobiliary: There is diffuse fatty infiltration of the liver parenchyma. No focal liver abnormality is seen. Status post cholecystectomy. No biliary dilatation. Pancreas: Unremarkable. No pancreatic ductal dilatation or surrounding inflammatory changes. Spleen: Normal in size without focal abnormality. Adrenals/Urinary Tract: Adrenal glands are unremarkable. Kidneys are normal, without renal calculi, focal lesion, or hydronephrosis. Bladder is unremarkable. Stomach/Bowel: There is a large hiatal hernia. Postoperative changes seen consistent with prior right hemicolectomy. No evidence of bowel wall thickening, distention, or inflammatory changes. Lymphatic: No abnormal abdominal or pelvic lymph nodes are identified. Reproductive: Uterus and bilateral adnexa are unremarkable. Other: No abdominal wall hernia or abnormality. No abdominopelvic ascites. Musculoskeletal: A total  left hip replacement is seen with associated streak artifact and subsequently limited evaluation of the adjacent osseous and soft tissue structures. Degenerative changes are noted throughout the lumbar spine. IMPRESSION: 1. Marked severity atherosclerotic disease without evidence of aneurysm or dissection. 2. Large hiatal hernia. 3. Hepatic steatosis. 4. Status post cholecystectomy. 5. Total left hip replacement. Aortic Atherosclerosis (ICD10-I70.0). Electronically Signed   By: Virgina Norfolk M.D.   On: 10/08/2020 19:41      Subjective: Patient seen and examined at bedside this morning.  Hemodynamically stable for discharge today.  Discussed with daughter about the discharge  planning   Discharge Exam: Vitals:   10/13/20 0350 10/13/20 0822  BP: (!) 147/51 (!) 128/48  Pulse: 70 69  Resp: 18 14  Temp: 98.2 F (36.8 C) 97.9 F (36.6 C)  SpO2: 93% 96%   Vitals:   10/12/20 2353 10/13/20 0350 10/13/20 0500 10/13/20 0822  BP: 124/61 (!) 147/51  (!) 128/48  Pulse: 72 70  69  Resp: '18 18  14  '$ Temp: 98.4 F (36.9 C) 98.2 F (36.8 C)  97.9 F (36.6 C)  TempSrc: Oral     SpO2: 95% 93%  96%  Weight:   88.2 kg   Height:        General: Pt is alert, awake, not in acute distress Cardiovascular: RRR, S1/S2 +, no rubs, no gallops Respiratory: CTA bilaterally, no wheezing, no rhonchi Abdominal: Soft, NT, ND, bowel sounds + Extremities: no edema, no cyanosis    The results of significant diagnostics from this hospitalization (including imaging, microbiology, ancillary and laboratory) are listed below for reference.     Microbiology: Recent Results (from the past 240 hour(s))  Resp Panel by RT-PCR (Flu A&B, Covid) Nasopharyngeal Swab     Status: None   Collection Time: 10/08/20  8:52 PM   Specimen: Nasopharyngeal Swab; Nasopharyngeal(NP) swabs in vial transport medium  Result Value Ref Range Status   SARS Coronavirus 2 by RT PCR NEGATIVE NEGATIVE Final    Comment: (NOTE) SARS-CoV-2 target nucleic acids are NOT DETECTED.  The SARS-CoV-2 RNA is generally detectable in upper respiratory specimens during the acute phase of infection. The lowest concentration of SARS-CoV-2 viral copies this assay can detect is 138 copies/mL. A negative result does not preclude SARS-Cov-2 infection and should not be used as the sole basis for treatment or other patient management decisions. A negative result may occur with  improper specimen collection/handling, submission of specimen other than nasopharyngeal swab, presence of viral mutation(s) within the areas targeted by this assay, and inadequate number of viral copies(<138 copies/mL). A negative result must be  combined with clinical observations, patient history, and epidemiological information. The expected result is Negative.  Fact Sheet for Patients:  EntrepreneurPulse.com.au  Fact Sheet for Healthcare Providers:  IncredibleEmployment.be  This test is no t yet approved or cleared by the [place] FDA and  has been authorized for detection and/or diagnosis of SARS-CoV-2 by FDA under an Emergency Use Authorization (EUA). This EUA will remain  in effect (meaning this test can be used) for the duration of the COVID-19 declaration under Section 564(b)(1) of the Act, 21 U.S.C.section 360bbb-3(b)(1), unless the authorization is terminated  or revoked sooner.       Influenza A by PCR NEGATIVE NEGATIVE Final   Influenza B by PCR NEGATIVE NEGATIVE Final    Comment: (NOTE) The Xpert Xpress SARS-CoV-2/FLU/RSV plus assay is intended as an aid in the diagnosis of influenza from Nasopharyngeal swab specimens and should not be  used as a sole basis for treatment. Nasal washings and aspirates are unacceptable for Xpert Xpress SARS-CoV-2/FLU/RSV testing.  Fact Sheet for Patients: EntrepreneurPulse.com.au  Fact Sheet for Healthcare Providers: IncredibleEmployment.be  This test is not yet approved or cleared by the [place] FDA and has been authorized for detection and/or diagnosis of SARS-CoV-2 by FDA under an Emergency Use Authorization (EUA). This EUA will remain in effect (meaning this test can be used) for the duration of the COVID-19 declaration under Section 564(b)(1) of the Act, 21 U.S.C. section 360bbb-3(b)(1), unless the authorization is terminated or revoked.  Performed at [facility], [address], [place], [place] [address]   Urine Culture     Status: None   Collection Time: 10/09/20  5:32 PM   Specimen: Urine, Random  Result Value Ref Range Status   Specimen Description   Final     URINE, RANDOM Performed at [facility], [address], [place], [place] [address]    Special Requests   Final    NONE Performed at [facility], [address] [place], [place] [address]    Culture   Final    NO GROWTH Performed at [facility], [place] [address] [place], [place] [address]    Report Status 10/11/2020 FINAL  Final     Labs: BNP (last 3 results) Recent Labs    10/09/20 0019  BNP [id number]*   Basic Metabolic Panel: Recent Labs  Lab 10/09/20 0625 10/10/20 0449 10/11/20 0453 10/12/20 0522 10/13/20 0331  NA 138 142 140 140 140  K 4.0 4.3 3.4* 3.2* 3.2*  CL 110 113* 113* 113* 112*  CO2 '22 24 22 24 24  '$ GLUCOSE 95 143* 97 96 108*  BUN 34* 27* '21 14 14  '$ CREATININE 1.50* 1.34* 1.31* 1.15* 1.04*  CALCIUM 8.6* 8.7* 8.8* 8.5* 9.0  MG  --  2.0  --  1.7  --    Liver Function Tests: Recent Labs  Lab 10/08/20 1757 10/09/20 0625  AST 28 19  ALT 13 13  ALKPHOS 65 57  BILITOT 0.6 1.1  PROT 6.3* 6.0*  ALBUMIN 3.3* 2.9*   No results for input(s): LIPASE, AMYLASE in the last 168 hours. No results for input(s): AMMONIA in the last 168 hours. CBC: Recent Labs  Lab 10/08/20 1757 10/09/20 0019 10/09/20 1100 10/09/20 1738 10/10/20 0835 10/10/20 1723 10/11/20 0453 10/12/20 0522 10/13/20 0331  WBC 7.1  --   --   --   --   --  7.0 6.9 7.7  NEUTROABS  --   --   --   --   --   --   --   --  5.5  HGB 4.7*   < > 7.4* 7.5* 7.9* 7.1* 7.1* 7.9* 8.4*  HCT 16.9*   < > 23.6* 23.8*  --   --  24.0* 24.5* 26.6*  MCV 78.2*  --   --   --   --   --  82.2 81.7 80.1  PLT 203  --   --   --   --   --  178 156 164   < > = values in this interval not displayed.   Cardiac Enzymes: No results for input(s): CKTOTAL, CKMB, CKMBINDEX, TROPONINI in the last 168 hours. BNP: Invalid input(s): POCBNP CBG: Recent Labs  Lab 10/12/20 1629 10/12/20 2005 10/12/20 2353 10/13/20 0427 10/13/20 0826  GLUCAP 226* 263* 170* 110* 97   D-Dimer No results for  input(s): DDIMER in  the last 72 hours. Hgb A1c No results for input(s): HGBA1C in the last 72 hours. Lipid Profile No results for input(s): CHOL, HDL, LDLCALC, TRIG, CHOLHDL, LDLDIRECT in the last 72 hours. Thyroid function studies No results for input(s): TSH, T4TOTAL, T3FREE, THYROIDAB in the last 72 hours.  Invalid input(s): FREET3 Anemia work up No results for input(s): VITAMINB12, FOLATE, FERRITIN, TIBC, IRON, RETICCTPCT in the last 72 hours. Urinalysis    Component Value Date/Time   COLORURINE YELLOW (A) 10/09/2020 0931   APPEARANCEUR HAZY (A) 10/09/2020 0931   APPEARANCEUR Hazy 09/29/2013 1552   LABSPEC 1.038 (H) 10/09/2020 0931   LABSPEC 1.016 09/29/2013 1552   PHURINE 5.0 10/09/2020 0931   GLUCOSEU NEGATIVE 10/09/2020 0931   GLUCOSEU Negative 09/29/2013 1552   HGBUR NEGATIVE 10/09/2020 0931   BILIRUBINUR NEGATIVE 10/09/2020 0931   BILIRUBINUR Negative 09/29/2013 1552   KETONESUR NEGATIVE 10/09/2020 0931   PROTEINUR NEGATIVE 10/09/2020 0931   NITRITE NEGATIVE 10/09/2020 0931   LEUKOCYTESUR MODERATE (A) 10/09/2020 0931   LEUKOCYTESUR Negative 09/29/2013 1552   [facility] Invalid input(s): PROCALCITONIN,  WBC,  LACTICIDVEN Microbiology Recent Results (from the past 240 hour(s))  Resp Panel by RT-PCR (Flu A&B, Covid) Nasopharyngeal Swab     Status: None   Collection Time: 10/08/20  8:52 PM   Specimen: Nasopharyngeal Swab; Nasopharyngeal(NP) swabs in vial transport medium  Result Value Ref Range Status   SARS Coronavirus 2 by RT PCR NEGATIVE NEGATIVE Final    Comment: (NOTE) SARS-CoV-2 target nucleic acids are NOT DETECTED.  The SARS-CoV-2 RNA is generally detectable in upper respiratory specimens during the acute phase of infection. The lowest concentration of SARS-CoV-2 viral copies this assay can detect is 138 copies/mL. A negative result does not preclude SARS-Cov-2 infection and should not be used as the sole basis for treatment or other patient management  decisions. A negative result may occur with  improper specimen collection/handling, submission of specimen other than nasopharyngeal swab, presence of viral mutation(s) within the areas targeted by this assay, and inadequate number of viral copies(<138 copies/mL). A negative result must be combined with clinical observations, patient history, and epidemiological information. The expected result is Negative.  Fact Sheet for Patients:  EntrepreneurPulse.com.au  Fact Sheet for Healthcare Providers:  IncredibleEmployment.be  This test is no t yet approved or cleared by the [place] FDA and  has been authorized for detection and/or diagnosis of SARS-CoV-2 by FDA under an Emergency Use Authorization (EUA). This EUA will remain  in effect (meaning this test can be used) for the duration of the COVID-19 declaration under Section 564(b)(1) of the Act, 21 U.S.C.section 360bbb-3(b)(1), unless the authorization is terminated  or revoked sooner.       Influenza A by PCR NEGATIVE NEGATIVE Final   Influenza B by PCR NEGATIVE NEGATIVE Final    Comment: (NOTE) The Xpert Xpress SARS-CoV-2/FLU/RSV plus assay is intended as an aid in the diagnosis of influenza from Nasopharyngeal swab specimens and should not be used as a sole basis for treatment. Nasal washings and aspirates are unacceptable for Xpert Xpress SARS-CoV-2/FLU/RSV testing.  Fact Sheet for Patients: EntrepreneurPulse.com.au  Fact Sheet for Healthcare Providers: IncredibleEmployment.be  This test is not yet approved or cleared by the [place] FDA and has been authorized for detection and/or diagnosis of SARS-CoV-2 by FDA under an Emergency Use Authorization (EUA). This EUA will remain in effect (meaning this test can be used) for the duration of the COVID-19 declaration under Section 564(b)(1) of the Act, 21 U.S.C. section  360bbb-3(b)(1), unless the  authorization is terminated or revoked.  Performed at [facility], [address] [place], [place] [address]   Urine Culture     Status: None   Collection Time: 10/09/20  5:32 PM   Specimen: Urine, Random  Result Value Ref Range Status   Specimen Description   Final    URINE, RANDOM Performed at [facility], [address] [place], [place] [address]    Special Requests   Final    NONE Performed at [facility], [address], [place], [place] [address]    Culture   Final    NO GROWTH Performed at [facility], [place] [address], [place], [place] [address]    Report Status 10/11/2020 FINAL  Final    Please note: You were cared for by a hospitalist during your hospital stay. Once you are discharged, your primary care physician will handle any further medical issues. Please note that NO REFILLS for any discharge medications will be authorized once you are discharged, as it is imperative that you return to your primary care physician (or establish a relationship with a primary care physician if you do not have one) for your post hospital discharge needs so that they can reassess your need for medications and monitor your lab values.    Time coordinating discharge: 40 minutes  SIGNED:   Shelly Coss, MD  [facility] 10/13/2020, 12:16 PM Pager [phone]  If 7PM-7AM, please contact night-coverage [email or web address] Password [email or web address]

## 2020-10-13 NOTE — Consult Note (Addendum)
Consultation Note Date: 10/13/2020   Patient Name: Krystal Goodwin  DOB: 05-24-1933  MRN: [id number]  Age / Sex: 87 y.o., female  PCP: Derinda Late, MD Referring Physician: Shelly Coss, MD  Reason for Consultation: Establishing goals of care  HPI/Patient Profile: Per EMR, 87 year old with diet controlled DMII, Aortic valve stenosis s/p aortic valve repair, mild dementia, asthma, colon ca s/p remote partial colectomy with last cscope in system noting no abnormality, IDA, Afib on Eliquis, HLD,Gout,HTN, Hypothyroidism, OSA noncompliant with cpap over the last 3 years who presents from NH due to low hgb. Per daughter who gives history patient one week ago was noted to be pale and not her usually self and as time when on she began to become more weak and noted to have lower energy that her baseline.  Per daughter mother had complaint of dysuria during this time but this resolved, otherwise patient had no particular complaints.  Clinical Assessment and Goals of Care: Patient is sitting in bedside chair. She can tell me her name, that we are at [facility] and it is [facility], and that she had a colonoscopy. I stated the word anemia and she said "low blood".   Daughter entered into conversation. She states she has a brother but is HPOA.  Daughter states her mother can remember some things but not others. Patient states to me that she still drives and drove a car a week ago- this is incorrect per daughter. She cannot tell me what a typical day looks like, just states "I just keep busy". She states her mother requires assistance with ADL's and no longer walks though she believes she does. Daughter tells me her mother has hallucinations that are negative and upset her, and that she "ruminates" on them. She states she sundowns and becomes more confused, but without agitation.    We discussed her diagnoses, prognosis, GOC, EOL  wishes disposition and options.  Created space and opportunity for patient  to explore thoughts and feelings regarding current medical information.   A detailed discussion was had today regarding advanced directives.  Concepts specific to code status, artifical feeding and hydration, IV antibiotics and rehospitalization were discussed.  The difference between an aggressive medical intervention path and a comfort care path was discussed.  Values and goals of care important to patient and family were attempted to be elicited.  Discussed limitations of medical interventions to prolong quality of life in some situations and discussed the concept of human mortality. Discussed multiple scenarios in detail. Discussed determining  what QOL would be acceptable to her.   Daughter tells me she knows what care her mother would and would not want. She states her mother would not want CPR or to be placed on a ventilator. She states she would never want a feeding tube.  She would not want her mother returned to the hospital, but would want her treated at her facility as their team is able.   Recommend outpatient palliative.   I completed a MOST form today  with daughter and the signed original was placed in the chart. A photocopy was placed in the chart to be scanned into EMR. The patient outlined their wishes for the following treatment decisions:  Cardiopulmonary Resuscitation: Do Not Attempt Resuscitation (DNR/No CPR)  Medical Interventions: Comfort Measures: Keep clean, warm, and dry. Use medication by any route, positioning, wound care, and other measures to relieve pain and suffering. Use oxygen, suction and manual treatment of airway obstruction as needed for comfort. Do not transfer to the hospital unless comfort needs cannot be met in current location. Treat as able at nursing facility.   Antibiotics: Determine use of limitation of antibiotics when infection occurs  IV Fluids: No IV fluids (provide other  measures to ensure comfort)  Feeding Tube: No feeding tube     SUMMARY OF RECOMMENDATIONS   DNR/DNI, no feeding tube Treat the treatable at nursing facility. No return to the hospital.  Could try Risperdal for delirium if she develops agitation.   Recommend outpatient palliative.    Prognosis:  Unable to determine      Primary Diagnoses: Present on Admission:  GI bleed   I have reviewed the medical record, interviewed the patient and family, and examined the patient. The following aspects are pertinent.  Past Medical History:  Diagnosis Date   Anemia    iron def.anemia   Aortic stenosis, severe    Asthma    Chest pain    Colon cancer (HCC)    Complication of anesthesia    nausea   Diabetes mellitus without complication (HCC)    Dyslipidemia    Dyspnea    Gout, joint    H/O postmenopausal osteoporosis    Hematuria    History of palpitations    Hx of colonic polyps    Hypertension    Hypothyroidism    IBS (irritable bowel syndrome)    Schatzki's ring    Sleep apnea    Social History   Socioeconomic History   Marital status: Widowed    Spouse name: Not on file   Number of children: Not on file   Years of education: Not on file   Highest education level: Not on file  Occupational History   Not on file  Tobacco Use   Smoking status: Former    Packs/day: 1.00    Years: 20.00    Pack years: 20.00    Types: Cigarettes    Quit date: 02/21/1989    Years since quitting: 31.6   Smokeless tobacco: Never  Vaping Use   Vaping Use: Never used  Substance and Sexual Activity   Alcohol use: No   Drug use: No   Sexual activity: Not on file  Other Topics Concern   Not on file  Social History Narrative   Not on file   Social Determinants of Health   Financial Resource Strain: Not on file  Food Insecurity: Not on file  Transportation Needs: Not on file  Physical Activity: Not on file  Stress: Not on file  Social Connections: Not on file   Family History   Problem Relation Age of Onset   Breast cancer Neg Hx    Scheduled Meds:  sodium chloride   Intravenous Once   apixaban  5 mg Oral BID   atorvastatin  10 mg Oral Daily   diclofenac Sodium  4 g Topical QID   ferrous sulfate  325 mg Oral Q breakfast   hydrocortisone   Topical TID   insulin aspart  0-5 Units Subcutaneous  QHS   insulin aspart  0-9 Units Subcutaneous TID WC   ipratropium-albuterol  3 mL Nebulization TID   levothyroxine  100 mcg Oral Q0600   metoprolol tartrate  12.5 mg Oral BID   pantoprazole  40 mg Oral BID   polyethylene glycol  17 g Oral Daily   predniSONE  20 mg Oral Q breakfast   sodium chloride flush  3 mL Intravenous Q12H   Continuous Infusions: PRN Meds:.ALPRAZolam, cyclobenzaprine, levalbuterol, morphine injection Medications Prior to Admission:  Prior to Admission medications   Medication Sig Start Date End Date Taking? Authorizing Provider  acetaminophen (TYLENOL) 650 MG CR tablet Take 650 mg by mouth 3 (three) times daily.   Yes [provider]  atorvastatin (LIPITOR) 10 MG tablet Take 10 mg by mouth daily. 06/20/19  Yes [provider]  cetirizine (ZYRTEC) 10 MG tablet Take 10 mg by mouth daily.   Yes [provider]  Cholecalciferol (VITAMIN D3) 50 MCG (2000 UT) TABS Take 2,000 Units by mouth daily at 12 noon.   Yes [provider]  citalopram (CELEXA) 20 MG tablet Take 20 mg by mouth daily. 08/21/19  Yes [provider]  diclofenac Sodium (VOLTAREN) 1 % GEL Apply 4 g topically 4 (four) times daily.   Yes [provider]  ELIQUIS 5 MG TABS tablet Take 5 mg by mouth 2 (two) times daily. 08/21/19  Yes [provider]  furosemide (LASIX) 20 MG tablet Take 20 mg by mouth daily.   Yes [provider]  HYDROcodone-acetaminophen (NORCO) 7.5-325 MG tablet Take 1 tablet by mouth 3 (three) times daily.   Yes [provider]  levothyroxine (SYNTHROID) 100 MCG tablet Take 100 mcg by mouth  daily before breakfast.    Yes [provider]  melatonin 5 MG TABS Take 5 mg by mouth at bedtime.   Yes [provider]  metoprolol succinate (TOPROL-XL) 25 MG 24 hr tablet Take 25 mg by mouth daily. 06/20/19  Yes [provider]  Multiple Vitamin (MULTIVITAMIN) tablet Take 1 tablet by mouth daily.   Yes [provider]  senna-docusate (SENOKOT-S) 8.6-50 MG tablet Take 2 tablets by mouth at bedtime.   Yes [provider]  ALPRAZolam (XANAX) 0.25 MG tablet Take 1-2 tablets (0.25-0.5 mg total) by mouth every 4 (four) hours as needed for anxiety or sleep. Patient not taking: No sig reported 09/09/19   Fritzi Mandes, MD  ferrous sulfate 325 (65 FE) MG tablet Take 325 mg by mouth daily with breakfast. Patient not taking: No sig reported    [provider]  HYDROcodone-acetaminophen (NORCO/VICODIN) 5-325 MG tablet Take 1 tablet by mouth every 4 (four) hours as needed for moderate pain. Patient not taking: No sig reported 09/06/19   Lattie Corns, PA-C  losartan (COZAAR) 25 MG tablet Take 1 tablet (25 mg total) by mouth daily. Patient not taking: No sig reported 09/09/19   Fritzi Mandes, MD  Melatonin-Pyridoxine 1-10 MG TABS Take by mouth at bedtime as needed. Patient not taking: No sig reported    [provider]  pantoprazole (PROTONIX) 40 MG tablet Take 40 mg by mouth daily. Patient not taking: No sig reported    [provider]  traMADol (ULTRAM) 50 MG tablet Take 1 tablet (50 mg total) by mouth every 6 (six) hours as needed for moderate pain. Patient not taking: No sig reported 09/06/19   Lattie Corns, PA-C   Allergies  Allergen Reactions   Amoxicillin Itching and Rash  Breaks out in a rash On hands On hands    Erythromycin Nausea And Vomiting and Other (See Comments)   Vicodin [Hydrocodone-Acetaminophen] Nausea And Vomiting   Review of Systems  Skin:        Redness and itching on left forearm.    Physical  Exam Pulmonary:     Effort: Pulmonary effort is normal.  Neurological:     Mental Status: She is alert.    Vital Signs: BP (!) 128/48 (BP Location: Left Arm)   Pulse 69   Temp 97.9 F (36.6 C)   Resp 14   Ht _0  (1.651 m)   Wt 88.2 kg   SpO2 96%   BMI 32.36 kg/m  Pain Scale: 0-10   Pain Score: 0-No pain   SpO2: SpO2: 96 % O2 Device:SpO2: 96 % O2 Flow Rate: .O2 Flow Rate (L/min): 2 L/min  IO: Intake/output summary:  Intake/Output Summary (Last 24 hours) at 10/13/2020 1134 Last data filed at 10/13/2020 0606 Gross per 24 hour  Intake 703.89 ml  Output 300 ml  Net 403.89 ml    LBM: Last BM Date: 10/12/20 Baseline Weight: Weight: 88.5 kg Most recent weight: Weight: 88.2 kg       Time In: 10:35 Time Out: 11:45 Time Total: 70 min Greater than 50%  of this time was spent counseling and coordinating care related to the above assessment and plan.  Signed by: Asencion Gowda, NP   Please contact Palliative Medicine Team phone at [phone] for questions and concerns.  For individual provider: See Shea Evans

## 2020-10-14 DIAGNOSIS — K254 Chronic or unspecified gastric ulcer with hemorrhage: Secondary | ICD-10-CM

## 2020-10-14 DIAGNOSIS — J45901 Unspecified asthma with (acute) exacerbation: Secondary | ICD-10-CM

## 2020-10-14 DIAGNOSIS — N179 Acute kidney failure, unspecified: Secondary | ICD-10-CM

## 2020-10-14 DIAGNOSIS — I8001 Phlebitis and thrombophlebitis of superficial vessels of right lower extremity: Secondary | ICD-10-CM

## 2020-10-14 DIAGNOSIS — D62 Acute posthemorrhagic anemia: Secondary | ICD-10-CM

## 2020-10-14 DIAGNOSIS — Q401 Congenital hiatus hernia: Secondary | ICD-10-CM

## 2020-10-15 LAB — SURGICAL PATHOLOGY

## 2020-10-30 DIAGNOSIS — E1159 Type 2 diabetes mellitus with other circulatory complications: Secondary | ICD-10-CM | POA: Diagnosis not present

## 2020-11-09 ENCOUNTER — Other Ambulatory Visit: Payer: Self-pay

## 2020-11-10 ENCOUNTER — Ambulatory Visit: Payer: Medicare PPO | Admitting: Gastroenterology

## 2020-11-12 ENCOUNTER — Other Ambulatory Visit: Payer: Self-pay

## 2020-11-12 ENCOUNTER — Encounter: Payer: Self-pay | Admitting: Gastroenterology

## 2020-11-12 ENCOUNTER — Ambulatory Visit (INDEPENDENT_AMBULATORY_CARE_PROVIDER_SITE_OTHER): Payer: Medicare PPO | Admitting: Gastroenterology

## 2020-11-12 VITALS — BP 119/72 | HR 76 | Temp 97.8°F

## 2020-11-12 DIAGNOSIS — K259 Gastric ulcer, unspecified as acute or chronic, without hemorrhage or perforation: Secondary | ICD-10-CM

## 2020-11-12 DIAGNOSIS — K449 Diaphragmatic hernia without obstruction or gangrene: Secondary | ICD-10-CM

## 2020-11-12 DIAGNOSIS — D509 Iron deficiency anemia, unspecified: Secondary | ICD-10-CM | POA: Diagnosis not present

## 2020-11-12 DIAGNOSIS — I350 Nonrheumatic aortic (valve) stenosis: Secondary | ICD-10-CM | POA: Insufficient documentation

## 2020-11-12 DIAGNOSIS — S42309A Unspecified fracture of shaft of humerus, unspecified arm, initial encounter for closed fracture: Secondary | ICD-10-CM | POA: Insufficient documentation

## 2020-11-12 NOTE — Progress Notes (Signed)
Krystal Darby, MD [address]  [place]  [place], [place] [address]  Main: [phone]  Fax: [phone]    Gastroenterology Consultation  Referring Provider:     Derinda Late, MD Primary Care Physician:  Derinda Late, MD Primary Gastroenterologist:  Dr. Cephas Goodwin Reason for Consultation:     Hospital follow-up, iron deficiency anemia        HPI:   Krystal Goodwin is a 87 y.o. female referred by Dr. Derinda Late, MD  for consultation & management of recent hospitalization for severe acute on chronic iron deficiency anemia.  Patient has history of metabolic syndrome, severe arctic stenosis s/p aortic valve replacement, mild dementia, history of colon cancer s/p partial colectomy, A. fib on Eliquis was admitted to [facility] on 10/09/2020 secondary to low hemoglobin of 4.7, dropped from 10.8.  Patient underwent upper endoscopy which revealed Cameron ulcer and hiatal hernia.  Colonoscopy was otherwise unremarkable.  Video capsule endoscopy was inconclusive.  Eliquis was resumed, patient was started on Protonix 40 mg p.o. twice daily long-term.    Interval summary: Patient is here for hospital follow-up.  She lives in a group home, accompanied by the nurse from group home.  She did not have any episodes of melena, rectal bleeding, hematochezia since discharge.  She is taking oral iron daily.  She is also taking Eliquis and Protonix 40 mg twice daily.  She is on stool softener  NSAIDs: None  Antiplts/Anticoagulants/Anti thrombotics: Eliquis  GI Procedures:  EGD and colonoscopy 10/11/2020 - Normal duodenal bulb and second portion of the duodenum. - Normal stomach. Biopsied. - 4 cm hiatal hernia with a single Cameron ulcer. - Normal gastroesophageal junction and esophagus.  - Preparation of the colon was fair. - Patent end-to-side ileo-colonic anastomosis, characterized by healthy appearing mucosa. - The examined portion of the ileum was normal. - The entire examined colon  is normal. - The distal rectum and anal verge are normal on retroflexion view. - No specimens collected.  Video capsule endoscopy 10/16/2020 Study was inconclusive, capsule did not pass beyond duodenum   Past Medical History:  Diagnosis Date   Anemia    iron def.anemia   Aortic stenosis, severe    Asthma    Chest pain    Colon cancer (HCC)    Complication of anesthesia    nausea   Diabetes mellitus without complication (HCC)    Dyslipidemia    Dyspnea    Gout, joint    H/O postmenopausal osteoporosis    Hematuria    History of palpitations    Hx of colonic polyps    Hypertension    Hypothyroidism    IBS (irritable bowel syndrome)    Schatzki's ring    Sleep apnea     Past Surgical History:  Procedure Laterality Date   AORTIC VALVE REPLACEMENT     CARDIAC PACEMAKER PLACEMENT     CHOLECYSTECTOMY     COLON SURGERY  1997   RIGHT HEMICOLECTOMY   COLONOSCOPY     COLONOSCOPY WITH PROPOFOL N/A 09/07/2016   Procedure: COLONOSCOPY WITH PROPOFOL;  Surgeon: Manya Silvas, MD;  Location: [facility] ENDOSCOPY;  Service: Endoscopy;  Laterality: N/A;   COLONOSCOPY WITH PROPOFOL N/A 10/11/2020   Procedure: COLONOSCOPY WITH PROPOFOL;  Surgeon: Lin Landsman, MD;  Location: [facility] ENDOSCOPY;  Service: Gastroenterology;  Laterality: N/A;   ESOPHAGOGASTRODUODENOSCOPY     ESOPHAGOGASTRODUODENOSCOPY (EGD) WITH PROPOFOL N/A 10/11/2020   Procedure: ESOPHAGOGASTRODUODENOSCOPY (EGD) WITH PROPOFOL;  Surgeon: Lin Landsman,  MD;  Location: [facility] ENDOSCOPY;  Service: Gastroenterology;  Laterality: N/A;   GIVENS CAPSULE STUDY  10/11/2020   Procedure: GIVENS CAPSULE STUDY;  Surgeon: Lin Landsman, MD;  Location: [facility] ENDOSCOPY;  Service: Gastroenterology;;   HIP ARTHROPLASTY Left 09/05/2019   Procedure: ARTHROPLASTY BIPOLAR HIP (HEMIARTHROPLASTY);  Surgeon: Corky Mull, MD;  Location: [facility] ORS;  Service: Orthopedics;  Laterality: Left;   TEE WITHOUT CARDIOVERSION  03/08/2013    Current  Outpatient Medications:    acetaminophen (TYLENOL) 650 MG CR tablet, Take 650 mg by mouth 3 (three) times daily., Disp: , Rfl:    albuterol (VENTOLIN HFA) 108 (90 Base) MCG/ACT inhaler, Inhale 2 puffs into the lungs every 6 (six) hours as needed for wheezing or shortness of breath., Disp: 8 g, Rfl: 2   atorvastatin (LIPITOR) 10 MG tablet, Take 10 mg by mouth daily., Disp: , Rfl:    cetirizine (ZYRTEC) 10 MG tablet, Take 10 mg by mouth daily., Disp: , Rfl:    Cholecalciferol (VITAMIN D3) 50 MCG (2000 UT) TABS, Take 2,000 Units by mouth daily at 12 noon., Disp: , Rfl:    citalopram (CELEXA) 20 MG tablet, Take 20 mg by mouth daily., Disp: , Rfl:    diclofenac Sodium (VOLTAREN) 1 % GEL, , Disp: , Rfl:    ELIQUIS 5 MG TABS tablet, Take 5 mg by mouth 2 (two) times daily., Disp: , Rfl:    ferrous sulfate 325 (65 FE) MG tablet, Take 1 tablet (325 mg total) by mouth daily with breakfast., Disp: 30 tablet, Rfl: 0   furosemide (LASIX) 20 MG tablet, Take 20 mg by mouth daily., Disp: , Rfl:    gabapentin (NEURONTIN) 100 MG capsule, , Disp: , Rfl:    hydrocortisone 1 % ointment, Apply topically 3 (three) times daily., Disp: 30 g, Rfl: 0   hydrOXYzine (ATARAX/VISTARIL) 10 MG tablet, Take 10 mg by mouth 2 (two) times daily., Disp: , Rfl:    levothyroxine (SYNTHROID) 100 MCG tablet, Take 100 mcg by mouth daily before breakfast. , Disp: , Rfl:    melatonin 5 MG TABS, Take 5 mg by mouth at bedtime., Disp: , Rfl:    melatonin 5 MG TABS, Take by mouth., Disp: , Rfl:    metoprolol succinate (TOPROL-XL) 25 MG 24 hr tablet, Take 25 mg by mouth daily., Disp: , Rfl:    mometasone (ELOCON) 0.1 % lotion, Apply topically., Disp: , Rfl:    Multiple Vitamin (MULTIVITAMIN) tablet, Take 1 tablet by mouth daily., Disp: , Rfl:    pantoprazole (PROTONIX) 40 MG tablet, Take 1 tablet (40 mg total) by mouth 2 (two) times daily., Disp: 60 tablet, Rfl: 0   polyethylene glycol (MIRALAX / GLYCOLAX) 17 g packet, Take 17 g by mouth daily.,  Disp: 14 each, Rfl: 0   potassium chloride (KLOR-CON) 20 MEQ tablet, Take 1 tablet (20 mEq total) by mouth daily., Disp: 7 tablet, Rfl: 0   senna-docusate (SENOKOT-S) 8.6-50 MG tablet, Take 2 tablets by mouth at bedtime., Disp: , Rfl:    traMADol (ULTRAM) 50 MG tablet, Take by mouth., Disp: , Rfl:    triamcinolone cream (KENALOG) 0.1 %, Apply topically., Disp: , Rfl:     Family History  Problem Relation Age of Onset   Breast cancer Neg Hx      Social History   Tobacco Use   Smoking status: Former    Packs/day: 1.00    Years: 20.00    Pack years: 20.00    Types: Cigarettes  Quit date: 02/21/1989    Years since quitting: 31.7   Smokeless tobacco: Never  Vaping Use   Vaping Use: Never used  Substance Use Topics   Alcohol use: No   Drug use: No    Allergies as of 11/12/2020 - Review Complete 11/12/2020  Allergen Reaction Noted   Amoxicillin Itching and Rash 08/19/2013   Erythromycin Nausea And Vomiting and Other (See Comments) 02/21/2013   Vicodin [hydrocodone-acetaminophen] Nausea And Vomiting 09/06/2016    Review of Systems:    All systems reviewed and negative except where noted in HPI.   Physical Exam:  BP 119/72 (BP Location: Left Arm, Patient Position: Sitting, Cuff Size: Normal)   Pulse 76   Temp 97.8 F (36.6 C) (Oral)  No LMP recorded. Patient is postmenopausal.  General:   Alert,  Well-developed, well-nourished, pleasant and cooperative in NAD Head:  Normocephalic and atraumatic. Eyes:  Sclera clear, no icterus.   Conjunctiva pink. Ears:  Normal auditory acuity. Nose:  No deformity, discharge, or lesions. Mouth:  No deformity or lesions,oropharynx pink & moist. Neck:  Supple; no masses or thyromegaly. Lungs:  Respirations even and unlabored.  Clear throughout to auscultation.   No wheezes, crackles, or rhonchi. No acute distress. Heart:  Regular rate and rhythm; no murmurs, clicks, rubs, or gallops. Abdomen:  Normal bowel sounds. Soft, non-tender and  non-distended without masses, hepatosplenomegaly or hernias noted.  No guarding or rebound tenderness.   Rectal: Not performed Msk:  Symmetrical without gross deformities. Good, equal movement & strength bilaterally. Pulses:  Normal pulses noted. Extremities:  No clubbing or 2+ edema.  No cyanosis. Neurologic:  Alert and oriented x2;  grossly normal neurologically. Skin:  Intact without significant lesions or rashes. No jaundice. Psych:  Alert and cooperative. Normal mood and affect.  Imaging Studies: Reviewed  Assessment and Plan:   KADIJA CRUZEN is a 87 y.o. female with s/p AVR for severe aortic stenosis, A. fib on Eliquis, hypothyroidism, chronic iron deficiency anemia  Acute on chronic iron deficiency anemia: Likely multifactorial EGD revealed large hiatal hernia with Lysbeth Galas ulcer S/p right hemicolectomy with ileocolic anastomosis Patient does not have symptoms to suggest active GI bleed Recheck CBC, iron panel, B12 and folate levels, CMP Continue oral iron Continue Protonix 40 mg p.o. twice daily indefinitely due to presence of hiatal hernia and patient is not a surgical candidate for hernia repair   Follow up in 3 to 4 months   Krystal Darby, MD

## 2020-11-13 LAB — IRON,TIBC AND FERRITIN PANEL
Ferritin: 189 ng/mL — ABNORMAL HIGH (ref 15–150)
Iron Saturation: 17 % (ref 15–55)
Iron: 43 ug/dL (ref 27–139)
Total Iron Binding Capacity: 247 ug/dL — ABNORMAL LOW (ref 250–450)
UIBC: 204 ug/dL (ref 118–369)

## 2020-11-13 LAB — B12 AND FOLATE PANEL
Folate: 16.1 ng/mL (ref >3.0)
Vitamin B-12: 311 pg/mL (ref 232–1245)

## 2020-11-13 LAB — CBC
Hematocrit: 32 % — ABNORMAL LOW (ref 34.0–46.6)
Hemoglobin: 10 g/dL — ABNORMAL LOW (ref 11.1–15.9)
MCH: 27.8 pg (ref 26.6–33.0)
MCHC: 31.3 g/dL — ABNORMAL LOW (ref 31.5–35.7)
MCV: 89 fL (ref 79–97)
Platelets: 211 10*3/uL (ref 150–450)
RBC: 3.6 x10E6/uL — ABNORMAL LOW (ref 3.77–5.28)
RDW: 20.2 % — ABNORMAL HIGH (ref 11.7–15.4)
WBC: 6.7 10*3/uL (ref 3.4–10.8)

## 2020-11-13 LAB — COMPREHENSIVE METABOLIC PANEL
ALT: 9 IU/L (ref 0–32)
AST: 19 IU/L (ref 0–40)
Albumin/Globulin Ratio: 1.5 (ref 1.2–2.2)
Albumin: 3.7 g/dL (ref 3.6–4.6)
Alkaline Phosphatase: 91 IU/L (ref 44–121)
BUN/Creatinine Ratio: 18 (ref 12–28)
BUN: 22 mg/dL (ref 8–27)
Bilirubin Total: 0.3 mg/dL (ref 0.0–1.2)
CO2: 19 mmol/L — ABNORMAL LOW (ref 20–29)
Calcium: 9.4 mg/dL (ref 8.7–10.3)
Chloride: 107 mmol/L — ABNORMAL HIGH (ref 96–106)
Creatinine, Ser: 1.24 mg/dL — ABNORMAL HIGH (ref 0.57–1.00)
Globulin, Total: 2.4 g/dL (ref 1.5–4.5)
Glucose: 102 mg/dL — ABNORMAL HIGH (ref 70–99)
Potassium: 4.6 mmol/L (ref 3.5–5.2)
Sodium: 141 mmol/L (ref 134–144)
Total Protein: 6.1 g/dL (ref 6.0–8.5)
eGFR: 43 mL/min/{1.73_m2} — ABNORMAL LOW (ref >59)

## 2020-11-20 DIAGNOSIS — E1121 Type 2 diabetes mellitus with diabetic nephropathy: Secondary | ICD-10-CM

## 2020-11-20 DIAGNOSIS — I872 Venous insufficiency (chronic) (peripheral): Secondary | ICD-10-CM

## 2020-11-20 DIAGNOSIS — E039 Hypothyroidism, unspecified: Secondary | ICD-10-CM

## 2020-11-20 DIAGNOSIS — M1990 Unspecified osteoarthritis, unspecified site: Secondary | ICD-10-CM | POA: Diagnosis not present

## 2020-11-20 DIAGNOSIS — N183 Chronic kidney disease, stage 3 unspecified: Secondary | ICD-10-CM | POA: Diagnosis not present

## 2020-11-20 DIAGNOSIS — F1120 Opioid dependence, uncomplicated: Secondary | ICD-10-CM

## 2020-11-20 DIAGNOSIS — F0150 Vascular dementia without behavioral disturbance: Secondary | ICD-10-CM | POA: Diagnosis not present

## 2020-11-20 DIAGNOSIS — F39 Unspecified mood [affective] disorder: Secondary | ICD-10-CM | POA: Diagnosis not present

## 2020-12-10 DIAGNOSIS — E1159 Type 2 diabetes mellitus with other circulatory complications: Secondary | ICD-10-CM | POA: Diagnosis not present

## 2020-12-10 DIAGNOSIS — E039 Hypothyroidism, unspecified: Secondary | ICD-10-CM | POA: Diagnosis not present

## 2020-12-21 DIAGNOSIS — I10 Essential (primary) hypertension: Secondary | ICD-10-CM | POA: Diagnosis not present

## 2021-01-11 DIAGNOSIS — H903 Sensorineural hearing loss, bilateral: Secondary | ICD-10-CM | POA: Diagnosis not present

## 2021-01-18 DIAGNOSIS — N1831 Chronic kidney disease, stage 3a: Secondary | ICD-10-CM | POA: Diagnosis not present

## 2021-01-18 DIAGNOSIS — E039 Hypothyroidism, unspecified: Secondary | ICD-10-CM | POA: Diagnosis not present

## 2021-01-18 DIAGNOSIS — F39 Unspecified mood [affective] disorder: Secondary | ICD-10-CM | POA: Diagnosis not present

## 2021-01-18 DIAGNOSIS — E1121 Type 2 diabetes mellitus with diabetic nephropathy: Secondary | ICD-10-CM | POA: Diagnosis not present

## 2021-01-18 DIAGNOSIS — F0150 Vascular dementia without behavioral disturbance: Secondary | ICD-10-CM | POA: Diagnosis not present

## 2021-01-18 DIAGNOSIS — E1122 Type 2 diabetes mellitus with diabetic chronic kidney disease: Secondary | ICD-10-CM | POA: Diagnosis not present

## 2021-01-18 DIAGNOSIS — I480 Paroxysmal atrial fibrillation: Secondary | ICD-10-CM | POA: Diagnosis not present

## 2021-01-18 DIAGNOSIS — M170 Bilateral primary osteoarthritis of knee: Secondary | ICD-10-CM | POA: Diagnosis not present

## 2021-02-08 DIAGNOSIS — B351 Tinea unguium: Secondary | ICD-10-CM | POA: Diagnosis not present

## 2021-02-08 DIAGNOSIS — L603 Nail dystrophy: Secondary | ICD-10-CM | POA: Diagnosis not present

## 2021-02-08 DIAGNOSIS — E1159 Type 2 diabetes mellitus with other circulatory complications: Secondary | ICD-10-CM | POA: Diagnosis not present

## 2021-02-15 ENCOUNTER — Encounter: Payer: Self-pay | Admitting: Gastroenterology

## 2021-02-15 ENCOUNTER — Ambulatory Visit (INDEPENDENT_AMBULATORY_CARE_PROVIDER_SITE_OTHER): Payer: Medicare PPO | Admitting: Gastroenterology

## 2021-02-15 ENCOUNTER — Other Ambulatory Visit: Payer: Self-pay

## 2021-02-15 VITALS — BP 116/74 | HR 71 | Temp 98.4°F

## 2021-02-15 DIAGNOSIS — Z862 Personal history of diseases of the blood and blood-forming organs and certain disorders involving the immune mechanism: Secondary | ICD-10-CM | POA: Diagnosis not present

## 2021-02-15 DIAGNOSIS — K449 Diaphragmatic hernia without obstruction or gangrene: Secondary | ICD-10-CM

## 2021-02-15 NOTE — Progress Notes (Signed)
[name], MD [address]  [place]  [place], [place] [address]  Main: [phone]  Fax: [phone]    Gastroenterology Consultation  Referring Provider:     Derinda Late, MD Primary Care Physician:  Derinda Late, MD Primary Gastroenterologist:  Dr. [name] Reason for Consultation:     Hospital follow-up, iron deficiency anemia        HPI:   Krystal Goodwin is a 88 y.o. female referred by Dr. Derinda Late, MD  for consultation & management of recent hospitalization for severe acute on chronic iron deficiency anemia.  Patient has history of metabolic syndrome, severe arctic stenosis s/p aortic valve replacement, mild dementia, history of colon cancer s/p partial colectomy, A. fib on Eliquis was admitted to [facility] on 10/09/2020 secondary to low hemoglobin of 4.7, dropped from 10.8.  Patient underwent upper endoscopy which revealed Cameron ulcer and hiatal hernia.  Colonoscopy was otherwise unremarkable.  Video capsule endoscopy was inconclusive.  Eliquis was resumed, patient was started on Protonix 40 mg p.o. twice daily long-term.    Interval summary: Patient is here for hospital follow-up.  She lives in a group home, accompanied by the nurse from group home.  She did not have any episodes of melena, rectal bleeding, hematochezia since discharge.  She is taking oral iron daily.  She is also taking Eliquis and Protonix 40 mg twice daily.  She is on stool softener  Follow-up visit 02/15/2021 Patient is here from group home for follow-up of iron deficiency anemia.  From her labs from last visit in October, her iron deficiency anemia has improved, hemoglobin improved from 8.4-10, normal MCV.  Ferritin was 189. Patient is apparently on oral iron daily at group home based on her medication list.  Patient denied any GI symptoms today  NSAIDs: None  Antiplts/Anticoagulants/Anti thrombotics: Eliquis  GI Procedures:  EGD and colonoscopy 10/11/2020 - Normal duodenal bulb and  second portion of the duodenum. - Normal stomach. Biopsied. - 4 cm hiatal hernia with a single Cameron ulcer. - Normal gastroesophageal junction and esophagus.  - Preparation of the colon was fair. - Patent end-to-side ileo-colonic anastomosis, characterized by healthy appearing mucosa. - The examined portion of the ileum was normal. - The entire examined colon is normal. - The distal rectum and anal verge are normal on retroflexion view. - No specimens collected.  Video capsule endoscopy 10/16/2020 Study was inconclusive, capsule did not pass beyond duodenum   Past Medical History:  Diagnosis Date   Anemia    iron def.anemia   Aortic stenosis, severe    Asthma    Chest pain    Colon cancer (HCC)    Complication of anesthesia    nausea   Diabetes mellitus without complication (HCC)    Dyslipidemia    Dyspnea    Gout, joint    H/O postmenopausal osteoporosis    Hematuria    History of palpitations    Hx of colonic polyps    Hypertension    Hypothyroidism    IBS (irritable bowel syndrome)    Schatzki's ring    Sleep apnea     Past Surgical History:  Procedure Laterality Date   AORTIC VALVE REPLACEMENT     CARDIAC PACEMAKER PLACEMENT     CHOLECYSTECTOMY     COLON SURGERY  1997   RIGHT HEMICOLECTOMY   COLONOSCOPY     COLONOSCOPY WITH PROPOFOL N/A 09/07/2016   Procedure: COLONOSCOPY WITH PROPOFOL;  Surgeon: Manya Silvas, MD;  Location: [facility] ENDOSCOPY;  Service: Endoscopy;  Laterality: N/A;   COLONOSCOPY WITH PROPOFOL N/A 10/11/2020   Procedure: COLONOSCOPY WITH PROPOFOL;  Surgeon: Lin Landsman, MD;  Location: [facility] ENDOSCOPY;  Service: Gastroenterology;  Laterality: N/A;   ESOPHAGOGASTRODUODENOSCOPY     ESOPHAGOGASTRODUODENOSCOPY (EGD) WITH PROPOFOL N/A 10/11/2020   Procedure: ESOPHAGOGASTRODUODENOSCOPY (EGD) WITH PROPOFOL;  Surgeon: Lin Landsman, MD;  Location: [place];  Service: Gastroenterology;  Laterality: N/A;   GIVENS CAPSULE STUDY  10/11/2020    Procedure: GIVENS CAPSULE STUDY;  Surgeon: Lin Landsman, MD;  Location: [facility] ENDOSCOPY;  Service: Gastroenterology;;   HIP ARTHROPLASTY Left 09/05/2019   Procedure: ARTHROPLASTY BIPOLAR HIP (HEMIARTHROPLASTY);  Surgeon: Corky Mull, MD;  Location: [facility] ORS;  Service: Orthopedics;  Laterality: Left;   TEE WITHOUT CARDIOVERSION  03/08/2013    Current Outpatient Medications:    acetaminophen (TYLENOL) 650 MG CR tablet, Take 650 mg by mouth 3 (three) times daily., Disp: , Rfl:    cetirizine (ZYRTEC) 10 MG tablet, Take 10 mg by mouth daily., Disp: , Rfl:    Cholecalciferol (VITAMIN D3) 50 MCG (2000 UT) TABS, Take 2,000 Units by mouth daily at 12 noon., Disp: , Rfl:    citalopram (CELEXA) 20 MG tablet, Take 20 mg by mouth daily., Disp: , Rfl:    diclofenac Sodium (VOLTAREN) 1 % GEL, , Disp: , Rfl:    ELIQUIS 5 MG TABS tablet, Take 5 mg by mouth 2 (two) times daily., Disp: , Rfl:    ferrous sulfate 325 (65 FE) MG tablet, Take 1 tablet (325 mg total) by mouth daily with breakfast., Disp: 30 tablet, Rfl: 0   furosemide (LASIX) 20 MG tablet, Take 20 mg by mouth daily., Disp: , Rfl:    gabapentin (NEURONTIN) 100 MG capsule, , Disp: , Rfl:    hydrOXYzine (ATARAX/VISTARIL) 10 MG tablet, Take 10 mg by mouth 2 (two) times daily., Disp: , Rfl:    levothyroxine (SYNTHROID) 100 MCG tablet, Take 100 mcg by mouth daily before breakfast. , Disp: , Rfl:    melatonin 5 MG TABS, Take 5 mg by mouth at bedtime., Disp: , Rfl:    melatonin 5 MG TABS, Take by mouth., Disp: , Rfl:    metoprolol succinate (TOPROL-XL) 25 MG 24 hr tablet, Take 25 mg by mouth daily., Disp: , Rfl:    albuterol (VENTOLIN HFA) 108 (90 Base) MCG/ACT inhaler, Inhale 2 puffs into the lungs every 6 (six) hours as needed for wheezing or shortness of breath. (Patient not taking: Reported on 02/15/2021), Disp: 8 g, Rfl: 2   atorvastatin (LIPITOR) 10 MG tablet, Take 10 mg by mouth daily. (Patient not taking: Reported on 02/15/2021), Disp: , Rfl:     hydrocortisone 1 % ointment, Apply topically 3 (three) times daily. (Patient not taking: Reported on 02/15/2021), Disp: 30 g, Rfl: 0   mometasone (ELOCON) 0.1 % lotion, Apply topically. (Patient not taking: Reported on 02/15/2021), Disp: , Rfl:    Multiple Vitamin (MULTIVITAMIN) tablet, Take 1 tablet by mouth daily. (Patient not taking: Reported on 02/15/2021), Disp: , Rfl:    pantoprazole (PROTONIX) 40 MG tablet, Take 1 tablet (40 mg total) by mouth 2 (two) times daily. (Patient not taking: Reported on 02/15/2021), Disp: 60 tablet, Rfl: 0   polyethylene glycol (MIRALAX / GLYCOLAX) 17 g packet, Take 17 g by mouth daily. (Patient not taking: Reported on 02/15/2021), Disp: 14 each, Rfl: 0   potassium chloride (KLOR-CON) 20 MEQ tablet, Take 1 tablet (20 mEq total) by mouth daily. (  Patient not taking: Reported on 02/15/2021), Disp: 7 tablet, Rfl: 0   senna-docusate (SENOKOT-S) 8.6-50 MG tablet, Take 2 tablets by mouth at bedtime. (Patient not taking: Reported on 02/15/2021), Disp: , Rfl:    traMADol (ULTRAM) 50 MG tablet, Take by mouth. (Patient not taking: Reported on 02/15/2021), Disp: , Rfl:    triamcinolone cream (KENALOG) 0.1 %, Apply topically. (Patient not taking: Reported on 02/15/2021), Disp: , Rfl:     Family History  Problem Relation Age of Onset   Breast cancer Neg Hx      Social History   Tobacco Use   Smoking status: Former    Packs/day: 1.00    Years: 20.00    Pack years: 20.00    Types: Cigarettes    Quit date: 02/21/1989    Years since quitting: 32.0   Smokeless tobacco: Never  Vaping Use   Vaping Use: Never used  Substance Use Topics   Alcohol use: No   Drug use: No    Allergies as of 02/15/2021 - Review Complete 02/15/2021  Allergen Reaction Noted   Amoxicillin Itching and Rash 08/19/2013   Erythromycin Nausea And Vomiting and Other (See Comments) 02/21/2013   Vicodin [hydrocodone-acetaminophen] Nausea And Vomiting 09/06/2016    Review of Systems:    All systems reviewed and  negative except where noted in HPI.   Physical Exam:  BP 116/74 (BP Location: Left Arm, Patient Position: Sitting, Cuff Size: Normal)    Pulse 71    Temp 98.4 F (36.9 C) (Oral)  No LMP recorded. Patient is postmenopausal.  General:   Alert,  Well-developed, well-nourished, pleasant and cooperative in NAD Head:  Normocephalic and atraumatic. Eyes:  Sclera clear, no icterus.   Conjunctiva pink. Ears:  Normal auditory acuity. Nose:  No deformity, discharge, or lesions. Mouth:  No deformity or lesions,oropharynx pink & moist. Neck:  Supple; no masses or thyromegaly. Lungs:  Respirations even and unlabored.  Clear throughout to auscultation.   No wheezes, crackles, or rhonchi. No acute distress. Heart:  Regular rate and rhythm; no murmurs, clicks, rubs, or gallops. Abdomen:  Normal bowel sounds. Soft, non-tender and non-distended without masses, hepatosplenomegaly or hernias noted.  No guarding or rebound tenderness.   Rectal: Not performed Msk:  Symmetrical without gross deformities. Good, equal movement & strength bilaterally. Pulses:  Normal pulses noted. Extremities:  No clubbing or 2+ edema.  No cyanosis. Neurologic:  Alert and oriented x2;  grossly normal neurologically. Skin:  Intact without significant lesions or rashes. No jaundice. Psych:  Alert and cooperative. Normal mood and affect.  Imaging Studies: Reviewed  Assessment and Plan:   Krystal Goodwin is a 88 y.o. female with s/p AVR for severe aortic stenosis, A. fib on Eliquis, hypothyroidism, chronic iron deficiency anemia  Acute on chronic iron deficiency anemia: Likely multifactorial EGD revealed large hiatal hernia with Lysbeth Galas ulcer S/p right hemicolectomy with ileocolic anastomosis Patient does not have symptoms to suggest active GI bleed Recheck CBC, iron panel today Discontinue oral iron Continue Protonix 40 mg p.o. twice daily indefinitely due to presence of hiatal hernia and patient is not a surgical candidate for  hernia repair   Follow up as needed   [name], MD

## 2021-02-16 LAB — CBC
Hematocrit: 33.5 % — ABNORMAL LOW (ref 34.0–46.6)
Hemoglobin: 11.1 g/dL (ref 11.1–15.9)
MCH: 30.4 pg (ref 26.6–33.0)
MCHC: 33.1 g/dL (ref 31.5–35.7)
MCV: 92 fL (ref 79–97)
Platelets: 191 10*3/uL (ref 150–450)
RBC: 3.65 x10E6/uL — ABNORMAL LOW (ref 3.77–5.28)
RDW: 11.8 % (ref 11.7–15.4)
WBC: 6.3 10*3/uL (ref 3.4–10.8)

## 2021-02-16 LAB — IRON,TIBC AND FERRITIN PANEL
Ferritin: 125 ng/mL (ref 15–150)
Iron Saturation: 22 % (ref 15–55)
Iron: 54 ug/dL (ref 27–139)
Total Iron Binding Capacity: 241 ug/dL — ABNORMAL LOW (ref 250–450)
UIBC: 187 ug/dL (ref 118–369)

## 2021-03-24 DIAGNOSIS — K219 Gastro-esophageal reflux disease without esophagitis: Secondary | ICD-10-CM

## 2021-03-24 DIAGNOSIS — N183 Chronic kidney disease, stage 3 unspecified: Secondary | ICD-10-CM

## 2021-03-24 DIAGNOSIS — I4891 Unspecified atrial fibrillation: Secondary | ICD-10-CM

## 2021-03-24 DIAGNOSIS — F0150 Vascular dementia without behavioral disturbance: Secondary | ICD-10-CM

## 2021-03-24 DIAGNOSIS — E1121 Type 2 diabetes mellitus with diabetic nephropathy: Secondary | ICD-10-CM

## 2021-03-24 DIAGNOSIS — E039 Hypothyroidism, unspecified: Secondary | ICD-10-CM

## 2021-03-24 DIAGNOSIS — I872 Venous insufficiency (chronic) (peripheral): Secondary | ICD-10-CM

## 2021-03-24 DIAGNOSIS — F39 Unspecified mood [affective] disorder: Secondary | ICD-10-CM

## 2021-03-24 DIAGNOSIS — F70 Mild intellectual disabilities: Secondary | ICD-10-CM

## 2021-04-13 DIAGNOSIS — L603 Nail dystrophy: Secondary | ICD-10-CM | POA: Diagnosis not present

## 2021-04-13 DIAGNOSIS — B351 Tinea unguium: Secondary | ICD-10-CM | POA: Diagnosis not present

## 2021-04-13 DIAGNOSIS — E1159 Type 2 diabetes mellitus with other circulatory complications: Secondary | ICD-10-CM | POA: Diagnosis not present

## 2021-05-03 DIAGNOSIS — L03116 Cellulitis of left lower limb: Secondary | ICD-10-CM | POA: Diagnosis not present

## 2021-05-03 DIAGNOSIS — L97921 Non-pressure chronic ulcer of unspecified part of left lower leg limited to breakdown of skin: Secondary | ICD-10-CM | POA: Diagnosis not present

## 2021-05-31 DIAGNOSIS — F0150 Vascular dementia without behavioral disturbance: Secondary | ICD-10-CM | POA: Diagnosis not present

## 2021-05-31 DIAGNOSIS — M170 Bilateral primary osteoarthritis of knee: Secondary | ICD-10-CM | POA: Diagnosis not present

## 2021-05-31 DIAGNOSIS — K219 Gastro-esophageal reflux disease without esophagitis: Secondary | ICD-10-CM | POA: Diagnosis not present

## 2021-05-31 DIAGNOSIS — E1121 Type 2 diabetes mellitus with diabetic nephropathy: Secondary | ICD-10-CM | POA: Diagnosis not present

## 2021-05-31 DIAGNOSIS — F39 Unspecified mood [affective] disorder: Secondary | ICD-10-CM | POA: Diagnosis not present

## 2021-05-31 DIAGNOSIS — I4891 Unspecified atrial fibrillation: Secondary | ICD-10-CM | POA: Diagnosis not present

## 2021-05-31 DIAGNOSIS — N1831 Chronic kidney disease, stage 3a: Secondary | ICD-10-CM | POA: Diagnosis not present

## 2021-06-07 DIAGNOSIS — E1122 Type 2 diabetes mellitus with diabetic chronic kidney disease: Secondary | ICD-10-CM | POA: Diagnosis not present

## 2021-06-15 DIAGNOSIS — E1159 Type 2 diabetes mellitus with other circulatory complications: Secondary | ICD-10-CM | POA: Diagnosis not present

## 2021-06-15 DIAGNOSIS — L603 Nail dystrophy: Secondary | ICD-10-CM | POA: Diagnosis not present

## 2021-07-20 DIAGNOSIS — I442 Atrioventricular block, complete: Secondary | ICD-10-CM | POA: Diagnosis not present

## 2021-07-23 DIAGNOSIS — M1990 Unspecified osteoarthritis, unspecified site: Secondary | ICD-10-CM

## 2021-07-23 DIAGNOSIS — F39 Unspecified mood [affective] disorder: Secondary | ICD-10-CM

## 2021-07-23 DIAGNOSIS — E1159 Type 2 diabetes mellitus with other circulatory complications: Secondary | ICD-10-CM

## 2021-07-23 DIAGNOSIS — F0150 Vascular dementia without behavioral disturbance: Secondary | ICD-10-CM

## 2021-07-23 DIAGNOSIS — E039 Hypothyroidism, unspecified: Secondary | ICD-10-CM

## 2021-07-23 DIAGNOSIS — K219 Gastro-esophageal reflux disease without esophagitis: Secondary | ICD-10-CM

## 2021-07-23 DIAGNOSIS — I4891 Unspecified atrial fibrillation: Secondary | ICD-10-CM

## 2021-07-23 DIAGNOSIS — N183 Chronic kidney disease, stage 3 unspecified: Secondary | ICD-10-CM

## 2021-07-23 DIAGNOSIS — I872 Venous insufficiency (chronic) (peripheral): Secondary | ICD-10-CM

## 2021-08-19 DIAGNOSIS — E1159 Type 2 diabetes mellitus with other circulatory complications: Secondary | ICD-10-CM | POA: Diagnosis not present

## 2021-08-27 DIAGNOSIS — R490 Dysphonia: Secondary | ICD-10-CM | POA: Diagnosis not present

## 2021-08-27 DIAGNOSIS — R0982 Postnasal drip: Secondary | ICD-10-CM | POA: Diagnosis not present

## 2021-08-27 DIAGNOSIS — R070 Pain in throat: Secondary | ICD-10-CM | POA: Diagnosis not present

## 2021-09-02 DIAGNOSIS — D649 Anemia, unspecified: Secondary | ICD-10-CM | POA: Diagnosis not present

## 2021-09-16 DIAGNOSIS — M179 Osteoarthritis of knee, unspecified: Secondary | ICD-10-CM | POA: Diagnosis not present

## 2021-09-16 DIAGNOSIS — E1121 Type 2 diabetes mellitus with diabetic nephropathy: Secondary | ICD-10-CM | POA: Diagnosis not present

## 2021-09-16 DIAGNOSIS — F0150 Vascular dementia without behavioral disturbance: Secondary | ICD-10-CM | POA: Diagnosis not present

## 2021-09-16 DIAGNOSIS — E876 Hypokalemia: Secondary | ICD-10-CM | POA: Diagnosis not present

## 2021-09-16 DIAGNOSIS — N1832 Chronic kidney disease, stage 3b: Secondary | ICD-10-CM | POA: Diagnosis not present

## 2021-09-16 DIAGNOSIS — K219 Gastro-esophageal reflux disease without esophagitis: Secondary | ICD-10-CM | POA: Diagnosis not present

## 2021-09-16 DIAGNOSIS — I480 Paroxysmal atrial fibrillation: Secondary | ICD-10-CM | POA: Diagnosis not present

## 2021-09-16 LAB — BASIC METABOLIC PANEL
BUN: 19 (ref 4–21)
CO2: 24 — AB (ref 13–22)
Chloride: 108 (ref 99–108)
Creatinine: 1.1 (ref 0.5–1.1)
Glucose: 75
Potassium: 4.6 mEq/L (ref 3.5–5.1)
Sodium: 138 (ref 137–147)

## 2021-09-16 LAB — COMPREHENSIVE METABOLIC PANEL
Albumin: 3.1 — AB (ref 3.5–5.0)
Calcium: 9.4 (ref 8.7–10.7)

## 2021-09-17 DIAGNOSIS — Z0189 Encounter for other specified special examinations: Secondary | ICD-10-CM | POA: Diagnosis not present

## 2021-10-07 DIAGNOSIS — J029 Acute pharyngitis, unspecified: Secondary | ICD-10-CM | POA: Diagnosis not present

## 2021-10-07 DIAGNOSIS — Z112 Encounter for screening for other bacterial diseases: Secondary | ICD-10-CM | POA: Diagnosis not present

## 2021-10-18 DIAGNOSIS — E119 Type 2 diabetes mellitus without complications: Secondary | ICD-10-CM | POA: Diagnosis not present

## 2021-10-18 DIAGNOSIS — H31011 Macula scars of posterior pole (postinflammatory) (post-traumatic), right eye: Secondary | ICD-10-CM | POA: Diagnosis not present

## 2021-10-18 DIAGNOSIS — Z961 Presence of intraocular lens: Secondary | ICD-10-CM | POA: Diagnosis not present

## 2021-10-25 ENCOUNTER — Other Ambulatory Visit: Payer: Self-pay | Admitting: Internal Medicine

## 2021-10-25 MED ORDER — HYDROCODONE-ACETAMINOPHEN 7.5-325 MG PO TABS: 1.0000 | ORAL_TABLET | Freq: Four times a day (QID) | ORAL | 0 refills | Status: DC

## 2021-10-25 NOTE — Progress Notes (Signed)
Discussed with nurse on her ward. Has run out at [employer] [facility] [employer] will fill for this month before transfer of care

## 2021-10-28 DIAGNOSIS — E1159 Type 2 diabetes mellitus with other circulatory complications: Secondary | ICD-10-CM | POA: Diagnosis not present

## 2021-10-28 DIAGNOSIS — B351 Tinea unguium: Secondary | ICD-10-CM | POA: Diagnosis not present

## 2021-11-16 ENCOUNTER — Encounter: Payer: Self-pay | Admitting: Nurse Practitioner

## 2021-11-16 ENCOUNTER — Non-Acute Institutional Stay (SKILLED_NURSING_FACILITY): Payer: Self-pay | Admitting: Nurse Practitioner

## 2021-11-16 DIAGNOSIS — I10 Essential (primary) hypertension: Secondary | ICD-10-CM

## 2021-11-16 DIAGNOSIS — E782 Mixed hyperlipidemia: Secondary | ICD-10-CM

## 2021-11-16 DIAGNOSIS — M170 Bilateral primary osteoarthritis of knee: Secondary | ICD-10-CM

## 2021-11-16 DIAGNOSIS — F325 Major depressive disorder, single episode, in full remission: Secondary | ICD-10-CM

## 2021-11-16 DIAGNOSIS — I480 Paroxysmal atrial fibrillation: Secondary | ICD-10-CM

## 2021-11-16 DIAGNOSIS — D6869 Other thrombophilia: Secondary | ICD-10-CM

## 2021-11-16 DIAGNOSIS — F0150 Vascular dementia without behavioral disturbance: Secondary | ICD-10-CM

## 2021-11-16 DIAGNOSIS — E039 Hypothyroidism, unspecified: Secondary | ICD-10-CM

## 2021-11-16 DIAGNOSIS — I350 Nonrheumatic aortic (valve) stenosis: Secondary | ICD-10-CM

## 2021-11-16 DIAGNOSIS — N1832 Chronic kidney disease, stage 3b: Secondary | ICD-10-CM

## 2021-11-16 DIAGNOSIS — K449 Diaphragmatic hernia without obstruction or gangrene: Secondary | ICD-10-CM

## 2021-11-16 DIAGNOSIS — D500 Iron deficiency anemia secondary to blood loss (chronic): Secondary | ICD-10-CM

## 2021-11-16 NOTE — Progress Notes (Unsigned)
Location:  Other [facility]) Nursing Home Room Number: 829-H Place of Service:  SNF [phone])  Unk [name], [place], MD  Patient Care Team: Dewayne Shorter, MD as PCP - General [facility])  Extended Emergency Contact Information Primary Emergency Contact: Krystal Goodwin Address: [place]          [place] Goodwin, [name] [address] Krystal Goodwin of [place] Phone: [phone] Work Phone: [phone] Mobile Phone: [phone] Relation: Daughter Secondary Emergency Contact: Krystal Goodwin Mobile Phone: [phone] Relation: Son  Goals of care: Advanced Directive information    11/16/2021    3:43 PM  Advanced Directives  Does Patient Have a Medical Advance Directive? Yes  Type of Advance Directive Out of facility DNR (pink MOST or yellow form)  Does patient want to make changes to medical advance directive? No - Patient declined     Chief Complaint  Patient presents with   Medical Management of Chronic Issues    Routine visit and foot exam today. Discuss need for dexa, a1c, eye exam, td/tdap, pneumonia, shingrix, covid, and flu vaccines or post pone if patient refuses. Vitals and medications are a reflection of [facility]MR system, [facility]      HPI:  Pt is a 88 y.o. female seen today for an routine follow up. She has been at [facility] for 1.5 years. Daughter visits her daily at lunch.  She has been doing well.  She has hx of dementia which daughter notes progressive decline Chronic knee pain on scheduled and PRN pain medication- pain seems to be well controlled however patient continues to complain about pain- daughter feels like she is on a loop due to the progression of her dementia. She thinks she can drive and that she is walking still.  She is wheelchair bound and self propels but is very slow therefore staff assist her with transfers.   No signs of anxiety or depression  Nursing is without any acute concerns.    No GERD. She is on chronic protonix due  to profound GI bleed and hx of HH. She is not a surgeryd candiate.    Past Medical History:  Diagnosis Date   Anemia    iron def.anemia   Aortic stenosis, severe    Asthma    Chest pain    Colon cancer (HCC)    Complication of anesthesia    nausea   Diabetes mellitus without complication (HCC)    Dyslipidemia    Dyspnea    Gout, joint    H/O postmenopausal osteoporosis    Hematuria    History of palpitations    Hx of colonic polyps    Hypertension    Hypothyroidism    IBS (irritable bowel syndrome)    Schatzki's ring    Sleep apnea    Past Surgical History:  Procedure Laterality Date   AORTIC VALVE REPLACEMENT     CARDIAC PACEMAKER PLACEMENT     CHOLECYSTECTOMY     COLON SURGERY  1997   RIGHT HEMICOLECTOMY   COLONOSCOPY     COLONOSCOPY WITH PROPOFOL N/A 09/07/2016   Procedure: COLONOSCOPY WITH PROPOFOL;  Surgeon: Manya Silvas, MD;  Location: [facility] ENDOSCOPY;  Service: Endoscopy;  Laterality: N/A;   COLONOSCOPY WITH PROPOFOL N/A 10/11/2020   Procedure: COLONOSCOPY WITH PROPOFOL;  Surgeon: Lin Landsman, MD;  Location: [facility] ENDOSCOPY;  Service: Gastroenterology;  Laterality: N/A;   ESOPHAGOGASTRODUODENOSCOPY     ESOPHAGOGASTRODUODENOSCOPY (EGD) WITH PROPOFOL N/A 10/11/2020   Procedure: ESOPHAGOGASTRODUODENOSCOPY (EGD) WITH PROPOFOL;  Surgeon: Sherri Sear  Reece Levy, MD;  Location: [place];  Service: Gastroenterology;  Laterality: N/A;   GIVENS CAPSULE STUDY  10/11/2020   Procedure: GIVENS CAPSULE STUDY;  Surgeon: Lin Landsman, MD;  Location: [facility] ENDOSCOPY;  Service: Gastroenterology;;   HIP ARTHROPLASTY Left 09/05/2019   Procedure: ARTHROPLASTY BIPOLAR HIP (HEMIARTHROPLASTY);  Surgeon: Corky Mull, MD;  Location: [facility] ORS;  Service: Orthopedics;  Laterality: Left;   TEE WITHOUT CARDIOVERSION  03/08/2013    Allergies  Allergen Reactions   Amoxicillin Itching and Rash    Breaks out in a rash On hands On hands    Erythromycin Nausea And Vomiting and  Other (See Comments)   Vicodin [Hydrocodone-Acetaminophen] Nausea And Vomiting    Outpatient Encounter Medications as of 11/16/2021  Medication Sig   acetaminophen (TYLENOL) 500 MG tablet Take 500 mg by mouth 3 (three) times daily.   albuterol (VENTOLIN HFA) 108 (90 Base) MCG/ACT inhaler Inhale 2 puffs into the lungs every 6 (six) hours as needed for wheezing or shortness of breath.   atorvastatin (LIPITOR) 10 MG tablet Take 10 mg by mouth daily.   bisacodyl (DULCOLAX) 10 MG suppository Place 10 mg rectally daily as needed for moderate constipation.   cetirizine (ZYRTEC) 10 MG tablet Take 10 mg by mouth daily.   Cholecalciferol (VITAMIN D3) 50 MCG (2000 UT) TABS Take 2,000 Units by mouth daily.   citalopram (CELEXA) 10 MG tablet Take 10 mg by mouth as directed. Every Monday and Thursday for depression (see other listings)   citalopram (CELEXA) 10 MG tablet Take 10 mg by mouth daily. Time of day not specified per [place] (see other listings)   citalopram (CELEXA) 20 MG tablet Take 20 mg by mouth as directed. Every Tuesday, Wednesday, Friday, Saturday, and Sunday per [facility] EMR system [facility] [employer] [facility] (see other listings)   diclofenac Sodium (VOLTAREN) 1 % GEL 4 g 4 (four) times daily. To left knee   ELIQUIS 5 MG TABS tablet Take 5 mg by mouth 2 (two) times daily.   ferrous sulfate 325 (65 FE) MG tablet Take 1 tablet (325 mg total) by mouth daily with breakfast.   furosemide (LASIX) 20 MG tablet Take 20 mg by mouth daily.   HYDROcodone-acetaminophen (NORCO) 7.5-325 MG tablet Take 1 tablet by mouth 2 (two) times daily as needed for moderate pain.   HYDROcodone-acetaminophen (NORCO) 7.5-325 MG tablet Take 1 tablet by mouth daily as needed for moderate pain (during the night). See other listings   levothyroxine (SYNTHROID) 100 MCG tablet Take 100 mcg by mouth at bedtime.   Lidocaine-Collagen-Aloe Vera 2 % GEL Apply 1 Application topically every 2 (two) hours as  needed (to lower lip for lip pain).   melatonin 5 MG TABS Take 5 mg by mouth at bedtime.   metoprolol succinate (TOPROL-XL) 25 MG 24 hr tablet Take 25 mg by mouth daily.   Multiple Vitamin (MULTIVITAMIN) tablet Take 1 tablet by mouth daily.   nystatin (MYCOSTATIN/NYSTOP) powder Apply 1 Application topically every 12 (twelve) hours as needed (to groin area for skin breakdown).   ondansetron (ZOFRAN) 4 MG tablet Take 4 mg by mouth every 6 (six) hours as needed for nausea or vomiting.   pantoprazole (PROTONIX) 40 MG tablet Take 1 tablet (40 mg total) by mouth 2 (two) times daily.   polyethylene glycol (MIRALAX / GLYCOLAX) 17 g packet Take 17 g by mouth every other day.   potassium chloride (KLOR-CON M) 10 MEQ tablet Take 10 mEq by  mouth daily.   senna-docusate (SENOKOT-S) 8.6-50 MG tablet Take 2 tablets by mouth at bedtime.   triamcinolone cream (KENALOG) 0.1 % Apply 1 Application topically every 8 (eight) hours as needed (for itchy skin and legs).   [DISCONTINUED] citalopram (CELEXA) 20 MG tablet Take 20 mg by mouth daily.   [DISCONTINUED] gabapentin (NEURONTIN) 100 MG capsule    [DISCONTINUED] HYDROcodone-acetaminophen (NORCO) 7.5-325 MG tablet Take 1 tablet by mouth in the morning, at noon, in the evening, and at bedtime. Plus 1 daily prn   [DISCONTINUED] hydrocortisone 1 % ointment Apply topically 3 (three) times daily. (Patient not taking: Reported on 02/15/2021)   [DISCONTINUED] hydrOXYzine (ATARAX/VISTARIL) 10 MG tablet Take 10 mg by mouth 2 (two) times daily.   [DISCONTINUED] melatonin 5 MG TABS Take by mouth.   [DISCONTINUED] mometasone (ELOCON) 0.1 % lotion Apply topically. (Patient not taking: Reported on 02/15/2021)   [DISCONTINUED] polyethylene glycol (MIRALAX / GLYCOLAX) 17 g packet Take 17 g by mouth daily. (Patient not taking: Reported on 02/15/2021)   [DISCONTINUED] potassium chloride (KLOR-CON) 20 MEQ tablet Take 1 tablet (20 mEq total) by mouth daily. (Patient not taking: Reported on  02/15/2021)   [DISCONTINUED] triamcinolone cream (KENALOG) 0.1 % Apply topically. (Patient not taking: Reported on 02/15/2021)   No facility-administered encounter medications on file as of 11/16/2021.    Review of Systems  Unable to perform ROS: Dementia    Immunization History  Administered Date(s) Administered   Influenza Inj Mdck Quad Pf 10/29/2015, 11/03/2017, 10/30/2018   Influenza-Unspecified 10/26/2014, 11/10/2016   Moderna Covid-19 Vaccine Bivalent Booster 24yr & up 07/06/2021   Moderna SARS-COV2 Booster Vaccination 12/25/2019   Moderna Sars-Covid-2 Vaccination 02/20/2019, 03/20/2019   PFIZER(Purple Top)SARS-COV-2 Vaccination 08/07/2019, 08/30/2019   Pfizer Covid-19 Vaccine Bivalent Booster 187yr& up 10/30/2020   Zoster Recombinat (Shingrix) 10/20/2017, 12/21/2017   Pertinent  Health Maintenance Due  Topic Date Due   FOOT EXAM  Never done   OPHTHALMOLOGY EXAM  Never done   DEXA SCAN  Never done   HEMOGLOBIN A1C  04/08/2021   INFLUENZA VACCINE  09/07/2021      10/11/2020    7:52 AM 10/11/2020    9:30 PM 10/12/2020   10:54 AM 10/12/2020    8:00 PM 10/13/2020    8:22 AM  Fall Risk  Patient Fall Risk Level High fall risk High fall risk High fall risk High fall risk High fall risk   Functional Status Survey:    Vitals:   11/16/21 1523  BP: 123/76  Pulse: 73  Resp: 18  Temp: (!) 97.4 F (36.3 C)  SpO2: 96%  Weight: 194 lb (88 kg)  Height: '5\' 5"'$  (1.651 m)   Body mass index is 32.28 kg/m. Physical Exam Constitutional:      General: She is not in acute distress.    Appearance: She is well-developed. She is not diaphoretic.  HENT:     Head: Normocephalic and atraumatic.  Eyes:     Conjunctiva/sclera: Conjunctivae normal.     Pupils: Pupils are equal, round, and reactive to light.  Cardiovascular:     Rate and Rhythm: Normal rate and regular rhythm.     Heart sounds: Normal heart sounds.  Pulmonary:     Effort: Pulmonary effort is normal.     Breath sounds:  Normal breath sounds.  Abdominal:     General: Bowel sounds are normal.     Palpations: Abdomen is soft.  Musculoskeletal:     Cervical back: Normal range of motion and neck  supple.     Right lower leg: No edema.     Left lower leg: No edema.  Skin:    General: Skin is warm and dry.  Neurological:     Mental Status: She is alert. Mental status is at baseline.     Gait: Gait abnormal (wheelchair bound).  Psychiatric:        Mood and Affect: Mood normal.     Labs reviewed: Recent Labs    09/16/21 0000  NA 138  K 4.6  CL 108  CO2 24*  BUN 19  CREATININE 1.1  CALCIUM 9.4   Recent Labs    09/16/21 0000  ALBUMIN 3.1*   Recent Labs    02/15/21 1436  WBC 6.3  HGB 11.1  HCT 33.5*  MCV 92  PLT 191   Lab Results  Component Value Date   TSH 0.615 10/09/2020   Lab Results  Component Value Date   HGBA1C 5.5 10/09/2020   No results found for: "CHOL", "HDL", "LDLCALC", "LDLDIRECT", "TRIG", "CHOLHDL"  Significant Diagnostic Results in last 30 days:  No results found.  Assessment/Plan 1. Iron deficiency anemia due to chronic blood loss -continues on iron supplement, no signs of blood loss at this time -follow up cbc  2. Hypothyroidism, unspecified type -continues on synthroid, will follow up TSH  3. Stage 3b chronic kidney disease (HCC) -Chronic and stable Encourage proper hydration Follow metabolic panel Avoid nephrotoxic meds (NSAIDS)  4. Severe aortic stenosis -stable, continues on lasix with potassium supplement   5. PAF (paroxysmal atrial fibrillation) (HCC) Continues on metoprolol for rate control  6. Benign essential hypertension -Blood pressure well controlled, goal bp <140/90 Continue current medications and dietary modifications follow metabolic panel  7. Mixed hyperlipidemia -continues on statin -follow up lipid profile.   8. Primary osteoarthritis of both knees -she has been on schedule pain medication since at [facility]ily ~1.5 years per  daughter. She reports majority of her pain with walking and standing but she is wheelchair bound, she requires assistance for movement.  -she reports no pain at this time. Will decreased scheduled pain medication to twice daily She continues to have PRN if needed Discussed with family and nursing that there is likely some dependency due to being on scheduled medication for over a year and we will need to wean slow. Ideally would like to have her off all scheduled pain medication. Daughter in agreement   9. Vascular dementia without behavioral disturbance (HCC) Ongoing decline, no acute changes in cognitive or functional status, continue supportive care with SNF staff and family   23. Hiatal hernia Non surgical, continues on protonix 40 mg BID indefinitely per GI.   11. Depression, major, in remission ([place]) Has been doing well, no signs of depression at this time. She is on 2 doses of celexa, will change to 10 mg daily and monitor.   12. Secondary hypercoagulable state ([place]) Due to a fib, continues on eliquis, will follow up CBC  Called daughter to update her and discuss plan.   Carlos American. [place], [facility] [phone]

## 2021-11-18 ENCOUNTER — Other Ambulatory Visit: Payer: Self-pay | Admitting: Student

## 2021-11-18 ENCOUNTER — Other Ambulatory Visit: Payer: Self-pay | Admitting: Nurse Practitioner

## 2021-11-18 DIAGNOSIS — E039 Hypothyroidism, unspecified: Secondary | ICD-10-CM | POA: Diagnosis not present

## 2021-11-18 DIAGNOSIS — M179 Osteoarthritis of knee, unspecified: Secondary | ICD-10-CM

## 2021-11-18 DIAGNOSIS — E876 Hypokalemia: Secondary | ICD-10-CM | POA: Diagnosis not present

## 2021-11-18 DIAGNOSIS — D649 Anemia, unspecified: Secondary | ICD-10-CM | POA: Diagnosis not present

## 2021-11-18 DIAGNOSIS — E1159 Type 2 diabetes mellitus with other circulatory complications: Secondary | ICD-10-CM | POA: Diagnosis not present

## 2021-11-18 DIAGNOSIS — E1122 Type 2 diabetes mellitus with diabetic chronic kidney disease: Secondary | ICD-10-CM | POA: Diagnosis not present

## 2021-11-18 DIAGNOSIS — I10 Essential (primary) hypertension: Secondary | ICD-10-CM | POA: Diagnosis not present

## 2021-11-18 LAB — BASIC METABOLIC PANEL
BUN: 28 — AB (ref 4–21)
CO2: 23 — AB (ref 13–22)
Chloride: 109 — AB (ref 99–108)
Creatinine: 1.2 — AB (ref 0.5–1.1)
Potassium: 4.3 mEq/L (ref 3.5–5.1)
Sodium: 139 (ref 137–147)

## 2021-11-18 LAB — CBC AND DIFFERENTIAL
HCT: 34 — AB (ref 36–46)
Hemoglobin: 1 — AB (ref 12.0–16.0)
Neutrophils Absolute: 3270
Platelets: 181 10*3/uL (ref 150–400)
WBC: 6.1

## 2021-11-18 LAB — COMPREHENSIVE METABOLIC PANEL
Albumin: 3.6 (ref 3.5–5.0)
Calcium: 9.5 (ref 8.7–10.7)
Globulin: 3
eGFR: 45

## 2021-11-18 LAB — LIPID PANEL
Cholesterol: 135 (ref 0–200)
HDL: 43 (ref 35–70)
LDL Cholesterol: 65
Triglycerides: 194 — AB (ref 40–160)

## 2021-11-18 LAB — HEPATIC FUNCTION PANEL
ALT: 8 U/L (ref 7–35)
AST: 15 (ref 13–35)
Alkaline Phosphatase: 81 (ref 25–125)
Bilirubin, Total: 0.3

## 2021-11-18 LAB — TSH: TSH: 0.83 (ref 0.41–5.90)

## 2021-11-18 LAB — CBC: RBC: 3.88 (ref 3.87–5.11)

## 2021-11-18 MED ORDER — HYDROCODONE-ACETAMINOPHEN 7.5-325 MG PO TABS: 1.0000 | ORAL_TABLET | Freq: Two times a day (BID) | ORAL | 0 refills | Status: DC | PRN

## 2021-11-18 MED ORDER — HYDROCODONE-ACETAMINOPHEN 7.5-325 MG PO TABS: ORAL_TABLET | ORAL | 0 refills | Status: DC

## 2021-11-18 MED ORDER — HYDROCODONE-ACETAMINOPHEN 7.5-325 MG PO TABS: 1.0000 | ORAL_TABLET | Freq: Every day | ORAL | 0 refills | Status: DC | PRN

## 2021-11-18 NOTE — Progress Notes (Signed)
Patient with recent change of medication Rx. Refill today.

## 2021-11-19 ENCOUNTER — Other Ambulatory Visit: Payer: Self-pay | Admitting: Student

## 2021-11-19 NOTE — Progress Notes (Signed)
Abstraction of labs. Stable from previously.

## 2021-12-09 DIAGNOSIS — I10 Essential (primary) hypertension: Secondary | ICD-10-CM | POA: Diagnosis not present

## 2021-12-09 DIAGNOSIS — E1159 Type 2 diabetes mellitus with other circulatory complications: Secondary | ICD-10-CM | POA: Diagnosis not present

## 2021-12-09 DIAGNOSIS — E039 Hypothyroidism, unspecified: Secondary | ICD-10-CM | POA: Diagnosis not present

## 2021-12-09 LAB — LIPID PANEL
Cholesterol: 131 (ref 0–200)
HDL: 40 (ref 35–70)
LDL Cholesterol: 64
Triglycerides: 206 — AB (ref 40–160)

## 2021-12-09 LAB — CBC: RBC: 3.78 — AB (ref 3.87–5.11)

## 2021-12-09 LAB — HEPATIC FUNCTION PANEL
ALT: 9 U/L (ref 7–35)
AST: 15 (ref 13–35)
Bilirubin, Total: 0.6

## 2021-12-09 LAB — CBC AND DIFFERENTIAL
HCT: 33 — AB (ref 36–46)
Hemoglobin: 10.8 — AB (ref 12.0–16.0)
Neutrophils Absolute: 2778
Platelets: 183 10*3/uL (ref 150–400)
WBC: 5.8

## 2021-12-09 LAB — BASIC METABOLIC PANEL
BUN: 24 — AB (ref 4–21)
CO2: 26 — AB (ref 13–22)
Chloride: 108 (ref 99–108)
Creatinine: 1.2 — AB (ref 0.5–1.1)
Glucose: 83
Potassium: 4.8 mEq/L (ref 3.5–5.1)
Sodium: 141 (ref 137–147)

## 2021-12-09 LAB — COMPREHENSIVE METABOLIC PANEL
Albumin: 3.4 — AB (ref 3.5–5.0)
Calcium: 9.4 (ref 8.7–10.7)
Globulin: 2.9
eGFR: 43

## 2021-12-17 ENCOUNTER — Encounter: Payer: Self-pay | Admitting: Student

## 2021-12-20 DIAGNOSIS — E119 Type 2 diabetes mellitus without complications: Secondary | ICD-10-CM | POA: Diagnosis not present

## 2021-12-20 LAB — HEMOGLOBIN A1C: Hemoglobin A1C: 5.8

## 2022-01-09 DIAGNOSIS — R109 Unspecified abdominal pain: Secondary | ICD-10-CM | POA: Diagnosis not present

## 2022-01-10 ENCOUNTER — Non-Acute Institutional Stay (SKILLED_NURSING_FACILITY): Payer: Medicare PPO | Admitting: Student

## 2022-01-10 ENCOUNTER — Encounter: Payer: Self-pay | Admitting: Student

## 2022-01-10 DIAGNOSIS — R197 Diarrhea, unspecified: Secondary | ICD-10-CM | POA: Diagnosis not present

## 2022-01-10 NOTE — Progress Notes (Unsigned)
Location:  Other [facility] Room Number: [place]:  SNF [phone]) Provider:  Dr. Amada Kingfisher, MD  Patient Care Team: Dewayne Shorter, MD as PCP - General [facility] Medicine)  Extended Emergency Contact Information Primary Emergency Contact: Lazarus Salines Address: [address]          [place], [place] [address] Johnnette Litter of [place] Phone: [phone] Work Phone: [phone] Mobile Phone: [phone] Relation: Daughter Secondary Emergency Contact: Delena Serve Mobile Phone: [phone] Relation: Son  Code Status:  DNR Goals of care: Advanced Directive information    01/10/2022    9:19 AM  Advanced Directives  Does Patient Have a Medical Advance Directive? Yes  Type of Advance Directive Out of facility DNR (pink MOST or yellow form)  Does patient want to make changes to medical advance directive? No - Patient declined     Chief Complaint  Patient presents with   Acute Visit    Diarrhea.     HPI:  Pt is a 88 y.o. female seen today for an acute visit for follow up of episode of diarrhea. Patient had one episode of vomiting yesterday. This morning patient is alert and brushing her teeth at her sink. She says she is ready for breakfast after she gets her earrings and glasses on. She is a retired Licensed conveyancer. She states she has not had more diarrhea and no more nausea at this time.    Past Medical History:  Diagnosis Date   Anemia    iron def.anemia   Aortic stenosis, severe    Asthma    Chest pain    Colon cancer (HCC)    Complication of anesthesia    nausea   Diabetes mellitus without complication (HCC)    Dyslipidemia    Dyspnea    Gout, joint    H/O postmenopausal osteoporosis    Hematuria    History of palpitations    Hx of colonic polyps    Hypertension    Hypothyroidism    IBS (irritable bowel syndrome)    Schatzki's ring    Sleep apnea    Past Surgical History:  Procedure Laterality  Date   AORTIC VALVE REPLACEMENT     CARDIAC PACEMAKER PLACEMENT     CHOLECYSTECTOMY     COLON SURGERY  1997   RIGHT HEMICOLECTOMY   COLONOSCOPY     COLONOSCOPY WITH PROPOFOL N/A 09/07/2016   Procedure: COLONOSCOPY WITH PROPOFOL;  Surgeon: Manya Silvas, MD;  Location: [facility] ENDOSCOPY;  Service: Endoscopy;  Laterality: N/A;   COLONOSCOPY WITH PROPOFOL N/A 10/11/2020   Procedure: COLONOSCOPY WITH PROPOFOL;  Surgeon: Lin Landsman, MD;  Location: [facility] ENDOSCOPY;  Service: Gastroenterology;  Laterality: N/A;   ESOPHAGOGASTRODUODENOSCOPY     ESOPHAGOGASTRODUODENOSCOPY (EGD) WITH PROPOFOL N/A 10/11/2020   Procedure: ESOPHAGOGASTRODUODENOSCOPY (EGD) WITH PROPOFOL;  Surgeon: Lin Landsman, MD;  Location: [place];  Service: Gastroenterology;  Laterality: N/A;   GIVENS CAPSULE STUDY  10/11/2020   Procedure: GIVENS CAPSULE STUDY;  Surgeon: Lin Landsman, MD;  Location: [facility] ENDOSCOPY;  Service: Gastroenterology;;   HIP ARTHROPLASTY Left 09/05/2019   Procedure: ARTHROPLASTY BIPOLAR HIP (HEMIARTHROPLASTY);  Surgeon: Corky Mull, MD;  Location: [facility] ORS;  Service: Orthopedics;  Laterality: Left;   TEE WITHOUT CARDIOVERSION  03/08/2013    Allergies  Allergen Reactions   Amoxicillin Itching and Rash    Breaks out in a rash On hands On hands    Erythromycin Nausea And Vomiting and Other (See Comments)  Vicodin [Hydrocodone-Acetaminophen] Nausea And Vomiting    Outpatient Encounter Medications as of 01/10/2022  Medication Sig   acetaminophen (TYLENOL) 500 MG tablet Take 500 mg by mouth 3 (three) times daily.   albuterol (VENTOLIN HFA) 108 (90 Base) MCG/ACT inhaler Inhale 2 puffs into the lungs every 6 (six) hours as needed for wheezing or shortness of breath.   atorvastatin (LIPITOR) 10 MG tablet Take 10 mg by mouth daily.   bisacodyl (DULCOLAX) 10 MG suppository Place 10 mg rectally daily as needed for moderate constipation.   cetirizine (ZYRTEC) 10 MG tablet Take 10 mg by  mouth daily.   Cholecalciferol (VITAMIN D3) 50 MCG (2000 UT) TABS Take 2,000 Units by mouth daily.   citalopram (CELEXA) 10 MG tablet Take 10 mg by mouth daily. Time of day not specified per [place] (see other listings)   diclofenac Sodium (VOLTAREN) 1 % GEL 4 g 4 (four) times daily. To left knee   ELIQUIS 5 MG TABS tablet Take 5 mg by mouth 2 (two) times daily.   ferrous sulfate 325 (65 FE) MG tablet Take 1 tablet (325 mg total) by mouth daily with breakfast.   furosemide (LASIX) 20 MG tablet Take 20 mg by mouth daily.   HYDROcodone-acetaminophen (NORCO) 7.5-325 MG tablet 1 tablet twice daily scheduled with additional tablet as needed for pain   levothyroxine (SYNTHROID) 100 MCG tablet Take 100 mcg by mouth at bedtime.   Lidocaine-Collagen-Aloe Vera 2 % GEL Apply 1 Application topically every 2 (two) hours as needed (to lower lip for lip pain).   melatonin 5 MG TABS Take 5 mg by mouth at bedtime.   metoprolol succinate (TOPROL-XL) 25 MG 24 hr tablet Take 25 mg by mouth daily.   Multiple Vitamin (MULTIVITAMIN) tablet Take 1 tablet by mouth daily.   nystatin (MYCOSTATIN/NYSTOP) powder Apply 1 Application topically every 12 (twelve) hours as needed (to groin area for skin breakdown).   ondansetron (ZOFRAN) 4 MG tablet Take 4 mg by mouth every 6 (six) hours as needed for nausea or vomiting.   pantoprazole (PROTONIX) 40 MG tablet Take 1 tablet (40 mg total) by mouth 2 (two) times daily.   polyethylene glycol (MIRALAX / GLYCOLAX) 17 g packet Take 17 g by mouth every other day.   potassium chloride (KLOR-CON M) 10 MEQ tablet Take 10 mEq by mouth daily.   senna-docusate (SENOKOT-S) 8.6-50 MG tablet Take 2 tablets by mouth at bedtime.   triamcinolone cream (KENALOG) 0.1 % Apply 1 Application topically every 8 (eight) hours as needed (for itchy skin and legs).   [DISCONTINUED] citalopram (CELEXA) 10 MG tablet Take 10 mg by mouth as directed. Every Monday and Thursday for  depression (see other listings)   [DISCONTINUED] citalopram (CELEXA) 20 MG tablet Take 20 mg by mouth as directed. Every Tuesday, Wednesday, Friday, Saturday, and Sunday per [place] (see other listings)   No facility-administered encounter medications on file as of 01/10/2022.    Review of Systems  All other systems reviewed and are negative.   Immunization History  Administered Date(s) Administered   Influenza Inj Mdck Quad Pf 10/29/2015, 11/03/2017, 10/30/2018   Influenza-Unspecified 10/26/2014, 11/10/2016, 11/23/2021   Moderna Covid-19 Vaccine Bivalent Booster 71yr & up 07/06/2021   Moderna SARS-COV2 Booster Vaccination 12/25/2019   Moderna Sars-Covid-2 Vaccination 02/20/2019, 03/20/2019, 12/17/2021   PFIZER(Purple Top)SARS-COV-2 Vaccination 08/07/2019, 08/30/2019   Pfizer Covid-19 Vaccine Bivalent Booster 163yr& up 10/30/2020   Pneumococcal Polysaccharide-23 04/24/2020  Zoster Recombinat (Shingrix) 10/20/2017, 12/21/2017   Pertinent  Health Maintenance Due  Topic Date Due   FOOT EXAM  Never done   OPHTHALMOLOGY EXAM  Never done   DEXA SCAN  Never done   HEMOGLOBIN A1C  06/20/2022   INFLUENZA VACCINE  Completed      10/11/2020    7:52 AM 10/11/2020    9:30 PM 10/12/2020   10:54 AM 10/12/2020    8:00 PM 10/13/2020    8:22 AM  Fall Risk  Patient Fall Risk Level High fall risk High fall risk High fall risk High fall risk High fall risk   Functional Status Survey:    Vitals:   01/10/22 0906  BP: 136/77  Pulse: 85  Resp: 16  Temp: 98.4 F (36.9 C)  SpO2: 96%  Weight: 194 lb 9.6 oz (88.3 kg)  Height: '5\' 5"'$  (1.651 m)   Body mass index is 32.38 kg/m. Physical Exam Constitutional:      Appearance: Normal appearance.  Cardiovascular:     Rate and Rhythm: Normal rate.     Pulses: Normal pulses.  Pulmonary:     Effort: Pulmonary effort is normal.     Breath sounds: Normal breath sounds.  Abdominal:     General: Abdomen is flat. Bowel  sounds are normal.     Palpations: Abdomen is soft.  Neurological:     Mental Status: She is alert.     Labs reviewed: Recent Labs    09/16/21 0000 11/18/21 0000 12/09/21 0000  NA 138 139 141  K 4.6 4.3 4.8  CL 108 109* 108  CO2 24* 23* 26*  BUN 19 28* 24*  CREATININE 1.1 1.2* 1.2*  CALCIUM 9.4 9.5 9.4   Recent Labs    09/16/21 0000 11/18/21 0000 12/09/21 0000  AST  --  15 15  ALT  --  8 9  ALKPHOS  --  81  --   ALBUMIN 3.1* 3.6 3.4*   Recent Labs    02/15/21 1436 11/18/21 0000 12/09/21 0000  WBC 6.3 6.1 5.8  NEUTROABS  --  3,270.00 2,778.00  HGB 11.1 1.0* 10.8*  HCT 33.5* 34* 33*  MCV 92  --   --   PLT 191 181 183   Lab Results  Component Value Date   TSH 0.83 11/18/2021   Lab Results  Component Value Date   HGBA1C 5.8 12/20/2021   Lab Results  Component Value Date   CHOL 131 12/09/2021   HDL 40 12/09/2021   LDLCALC 64 12/09/2021   TRIG 206 (A) 12/09/2021    Significant Diagnostic Results in last 30 days:  No results found.  Assessment/Plan 1. Diarrhea, unspecified type Patient's diarrhea has resolved. Unclear cause. Continue to monitor for worsening symptoms.    Family/ staff Communication: nursing  Labs/tests ordered:  none

## 2022-02-04 ENCOUNTER — Non-Acute Institutional Stay (SKILLED_NURSING_FACILITY): Payer: Medicare PPO | Admitting: Student

## 2022-02-04 ENCOUNTER — Encounter: Payer: Self-pay | Admitting: Student

## 2022-02-04 DIAGNOSIS — I10 Essential (primary) hypertension: Secondary | ICD-10-CM

## 2022-02-04 DIAGNOSIS — S72002A Fracture of unspecified part of neck of left femur, initial encounter for closed fracture: Secondary | ICD-10-CM

## 2022-02-04 DIAGNOSIS — D500 Iron deficiency anemia secondary to blood loss (chronic): Secondary | ICD-10-CM | POA: Diagnosis not present

## 2022-02-04 DIAGNOSIS — E119 Type 2 diabetes mellitus without complications: Secondary | ICD-10-CM | POA: Diagnosis not present

## 2022-02-04 DIAGNOSIS — N1832 Chronic kidney disease, stage 3b: Secondary | ICD-10-CM

## 2022-02-04 DIAGNOSIS — S72002S Fracture of unspecified part of neck of left femur, sequela: Secondary | ICD-10-CM

## 2022-02-04 DIAGNOSIS — I350 Nonrheumatic aortic (valve) stenosis: Secondary | ICD-10-CM | POA: Diagnosis not present

## 2022-02-04 DIAGNOSIS — E039 Hypothyroidism, unspecified: Secondary | ICD-10-CM

## 2022-02-04 DIAGNOSIS — Z6841 Body Mass Index (BMI) 40.0 and over, adult: Secondary | ICD-10-CM

## 2022-02-04 DIAGNOSIS — H353210 Exudative age-related macular degeneration, right eye, stage unspecified: Secondary | ICD-10-CM

## 2022-02-04 DIAGNOSIS — S42309S Unspecified fracture of shaft of humerus, unspecified arm, sequela: Secondary | ICD-10-CM | POA: Diagnosis not present

## 2022-02-04 DIAGNOSIS — Z66 Do not resuscitate: Secondary | ICD-10-CM

## 2022-02-04 DIAGNOSIS — I444 Left anterior fascicular block: Secondary | ICD-10-CM

## 2022-02-04 DIAGNOSIS — I480 Paroxysmal atrial fibrillation: Secondary | ICD-10-CM

## 2022-02-04 DIAGNOSIS — I442 Atrioventricular block, complete: Secondary | ICD-10-CM

## 2022-02-04 DIAGNOSIS — I5032 Chronic diastolic (congestive) heart failure: Secondary | ICD-10-CM

## 2022-02-04 NOTE — Progress Notes (Signed)
Location:  Other [place].  Nursing Home Room Number: [facility] [address] Place of Service:  SNF [id number]) Provider:  Dr. Amada Kingfisher, MD  Patient Care Team: Dewayne Shorter, MD as PCP - General [facility] Medicine)  Extended Emergency Contact Information Primary Emergency Contact: Lazarus Salines Address: [address]          [place], [place] [address] Johnnette Litter of [place] Phone: [phone] Work Phone: [phone] Mobile Phone: [phone] Relation: Daughter Secondary Emergency Contact: Delena Serve Mobile Phone: [phone] Relation: Son  Code Status:  DNR Goals of care: Advanced Directive information    02/04/2022    9:04 AM  Advanced Directives  Does Patient Have a Medical Advance Directive? Yes  Type of Advance Directive Out of facility DNR (pink MOST or yellow form)  Does patient want to make changes to medical advance directive? No - Patient declined     Chief Complaint  Patient presents with   Medical Management of Chronic Issues    Medical Management of Chronic Issues.     HPI:  Pt is a 88 y.o. female seen today for medical management of chronic diseases.    Things havent been the same for her since her husbdand died. She has no conerns. She misses talking to her friends on the phone.   She has no concerns today.   Patient is alert and oriented to time. She knows we are in [place]. She states she had as good of a christmas one can have.   Nurses state she has requried more support with taking medication-- used to self-administer, however, now requiring splitting them up and serving by the nursing staff. Continues to eat and drink independently.   Past Medical History:  Diagnosis Date   Anemia    iron def.anemia   Aortic stenosis, severe    Asthma    Chest pain    Colon cancer (HCC)    Complication of anesthesia    nausea   Diabetes mellitus without complication (HCC)    Dyslipidemia    Dyspnea    Gout, joint     H/O postmenopausal osteoporosis    Hematuria    History of palpitations    Hx of colonic polyps    Hypertension    Hypothyroidism    IBS (irritable bowel syndrome)    Schatzki's ring    Sleep apnea    Past Surgical History:  Procedure Laterality Date   AORTIC VALVE REPLACEMENT     CARDIAC PACEMAKER PLACEMENT     CHOLECYSTECTOMY     COLON SURGERY  1997   RIGHT HEMICOLECTOMY   COLONOSCOPY     COLONOSCOPY WITH PROPOFOL N/A 09/07/2016   Procedure: COLONOSCOPY WITH PROPOFOL;  Surgeon: Manya Silvas, MD;  Location: [facility] ENDOSCOPY;  Service: Endoscopy;  Laterality: N/A;   COLONOSCOPY WITH PROPOFOL N/A 10/11/2020   Procedure: COLONOSCOPY WITH PROPOFOL;  Surgeon: Lin Landsman, MD;  Location: [facility] ENDOSCOPY;  Service: Gastroenterology;  Laterality: N/A;   ESOPHAGOGASTRODUODENOSCOPY     ESOPHAGOGASTRODUODENOSCOPY (EGD) WITH PROPOFOL N/A 10/11/2020   Procedure: ESOPHAGOGASTRODUODENOSCOPY (EGD) WITH PROPOFOL;  Surgeon: Lin Landsman, MD;  Location: [place];  Service: Gastroenterology;  Laterality: N/A;   GIVENS CAPSULE STUDY  10/11/2020   Procedure: GIVENS CAPSULE STUDY;  Surgeon: Lin Landsman, MD;  Location: [facility] ENDOSCOPY;  Service: Gastroenterology;;   HIP ARTHROPLASTY Left 09/05/2019   Procedure: ARTHROPLASTY BIPOLAR HIP (HEMIARTHROPLASTY);  Surgeon: Corky Mull, MD;  Location: [facility] ORS;  Service: Orthopedics;  Laterality: Left;  TEE WITHOUT CARDIOVERSION  03/08/2013    Allergies  Allergen Reactions   Amoxicillin Itching and Rash    Breaks out in a rash On hands On hands    Erythromycin Nausea And Vomiting and Other (See Comments)   Clindamycin/Lincomycin    Tramadol    Vicodin [Hydrocodone-Acetaminophen] Nausea And Vomiting    Outpatient Encounter Medications as of 02/04/2022  Medication Sig   acetaminophen (TYLENOL) 500 MG tablet Take 500 mg by mouth 3 (three) times daily.   albuterol (VENTOLIN HFA) 108 (90 Base) MCG/ACT inhaler Inhale 2 puffs into  the lungs every 6 (six) hours as needed for wheezing or shortness of breath.   atorvastatin (LIPITOR) 10 MG tablet Take 10 mg by mouth daily.   bisacodyl (DULCOLAX) 10 MG suppository Place 10 mg rectally daily as needed for moderate constipation.   bismuth subsalicylate (PEPTO BISMOL) 262 MG/15ML suspension Take 30 mLs by mouth every 4 (four) hours as needed.   cetirizine (ZYRTEC) 10 MG tablet Take 10 mg by mouth daily.   Cholecalciferol (VITAMIN D3) 50 MCG (2000 UT) TABS Take 2,000 Units by mouth daily.   citalopram (CELEXA) 10 MG tablet Take 10 mg by mouth daily. Time of day not specified per [place] (see other listings)   diclofenac Sodium (VOLTAREN) 1 % GEL 4 g 4 (four) times daily. To left knee   ELIQUIS 5 MG TABS tablet Take 5 mg by mouth 2 (two) times daily.   ferrous sulfate 325 (65 FE) MG tablet Take 1 tablet (325 mg total) by mouth daily with breakfast.   furosemide (LASIX) 20 MG tablet Take 20 mg by mouth daily.   HYDROcodone-acetaminophen (NORCO) 7.5-325 MG tablet 1 tablet twice daily scheduled with additional tablet as needed for pain   levothyroxine (SYNTHROID) 100 MCG tablet Take 100 mcg by mouth at bedtime.   Lidocaine-Collagen-Aloe Vera 2 % GEL Apply 1 Application topically every 2 (two) hours as needed (to lower lip for lip pain).   melatonin 5 MG TABS Take 5 mg by mouth at bedtime.   metoprolol succinate (TOPROL-XL) 25 MG 24 hr tablet Take 25 mg by mouth daily.   Multiple Vitamin (MULTIVITAMIN) tablet Take 1 tablet by mouth daily.   nystatin (MYCOSTATIN/NYSTOP) powder Apply 1 Application topically every 12 (twelve) hours as needed (to groin area for skin breakdown).   ondansetron (ZOFRAN) 4 MG tablet Take 4 mg by mouth every 6 (six) hours as needed for nausea or vomiting.   pantoprazole (PROTONIX) 40 MG tablet Take 1 tablet (40 mg total) by mouth 2 (two) times daily.   potassium chloride (KLOR-CON M) 10 MEQ tablet Take 10 mEq by mouth daily.    senna-docusate (SENOKOT-S) 8.6-50 MG tablet Take 2 tablets by mouth at bedtime.   triamcinolone cream (KENALOG) 0.1 % Apply 1 Application topically every 8 (eight) hours as needed (for itchy skin and legs).   [DISCONTINUED] polyethylene glycol (MIRALAX / GLYCOLAX) 17 g packet Take 17 g by mouth every other day.   No facility-administered encounter medications on file as of 02/04/2022.    Review of Systems  All other systems reviewed and are negative.   Immunization History  Administered Date(s) Administered   Influenza Inj Mdck Quad Pf 10/29/2015, 11/03/2017, 10/30/2018   Influenza-Unspecified 10/26/2014, 11/10/2016, 11/23/2021   Moderna Covid-19 Vaccine Bivalent Booster 10yr & up 07/06/2021   Moderna SARS-COV2 Booster Vaccination 12/25/2019   Moderna Sars-Covid-2 Vaccination 02/20/2019, 03/20/2019, 12/17/2021   PFIZER(Purple Top)SARS-COV-2 Vaccination 08/07/2019, 08/30/2019  Pension scheme manager 43yr & up 10/30/2020   Pneumococcal Polysaccharide-23 04/24/2020   Zoster Recombinat (Shingrix) 10/20/2017, 12/21/2017   Pertinent  Health Maintenance Due  Topic Date Due   FOOT EXAM  Never done   OPHTHALMOLOGY EXAM  Never done   DEXA SCAN  Never done   HEMOGLOBIN A1C  06/20/2022   INFLUENZA VACCINE  Completed      10/11/2020    7:52 AM 10/11/2020    9:30 PM 10/12/2020   10:54 AM 10/12/2020    8:00 PM 10/13/2020    8:22 AM  Fall Risk  Patient Fall Risk Level High fall risk High fall risk High fall risk High fall risk High fall risk   Functional Status Survey:    Vitals:   02/04/22 0851  BP: (!) 143/64  Pulse: 73  Resp: 18  Temp: (!) 97.4 F (36.3 C)  SpO2: 98%  Weight: 194 lb 9.6 oz (88.3 kg)  Height: '5\' 5"'$  (1.651 m)   Body mass index is 32.38 kg/m. Physical Exam Vitals and nursing note reviewed.  Constitutional:      Appearance: Normal appearance.  Cardiovascular:     Rate and Rhythm: Normal rate.     Pulses: Normal pulses.  Pulmonary:      Effort: Pulmonary effort is normal.  Abdominal:     General: Bowel sounds are normal.     Palpations: Abdomen is soft.  Skin:    General: Skin is warm.  Neurological:     Mental Status: She is alert and oriented to person, place, and time.     Labs reviewed: Recent Labs    09/16/21 0000 11/18/21 0000 12/09/21 0000  NA 138 139 141  K 4.6 4.3 4.8  CL 108 109* 108  CO2 24* 23* 26*  BUN 19 28* 24*  CREATININE 1.1 1.2* 1.2*  CALCIUM 9.4 9.5 9.4   Recent Labs    09/16/21 0000 11/18/21 0000 12/09/21 0000  AST  --  15 15  ALT  --  8 9  ALKPHOS  --  81  --   ALBUMIN 3.1* 3.6 3.4*   Recent Labs    02/15/21 1436 11/18/21 0000 12/09/21 0000  WBC 6.3 6.1 5.8  NEUTROABS  --  3,270.00 2,778.00  HGB 11.1 1.0* 10.8*  HCT 33.5* 34* 33*  MCV 92  --   --   PLT 191 181 183   Lab Results  Component Value Date   TSH 0.83 11/18/2021   Lab Results  Component Value Date   HGBA1C 5.8 12/20/2021   Lab Results  Component Value Date   CHOL 131 12/09/2021   HDL 40 12/09/2021   LDLCALC 64 12/09/2021   TRIG 206 (A) 12/09/2021    Significant Diagnostic Results in last 30 days:  No results found.  Assessment/Plan 1. Closed fracture of left hip, initial encounter ([place] 2. Closed fracture of neck of left femur, sequela DOI 09/05/2019 patient has required skilled nursing level of care. Patient continues to take norco bid for pain. Will continue goal directed medication reduction as tolerated. PRN PT.   3. Closed fracture of shaft of humerus, unspecified fracture morphology, unspecified laterality, sequela DOI 11/12/2020 Patient continues to have mobility of each arm. Pain controlled with norco BID. CTM.   4. Stage 3b chronic kidney disease ([place] CKD 3 with most recent creatinine 1.1, will plan to decrease dose of Eliquis to 2.5 mg twice daily.  5. Iron deficiency anemia due to chronic blood loss Patient with  history of iron deficiency anemia with most recent slightly below goal  at 10.8.  Will plan to continue iron supplementation and recheck iron levels in February 2024. Hemoglobin  Date/Time Value Ref Range Status  12/09/2021 12:00 AM 10.8 (A) 12.0 - 16.0 Final  02/15/2021 02:36 PM 11.1 11.1 - 15.9 g/dL Final   Iron  Date/Time Value Ref Range Status  02/15/2021 02:38 PM 54 27 - 139 ug/dL Final  ] 6. Diabetes mellitus type II, non insulin dependent ([place]) Most recent A1c within goal range.  No medications at this time.  Continue to monitor with annual A1c levels. Hemoglobin A1C  Date/Time Value Ref Range Status  12/20/2021 12:00 AM 5.8  Final  ]  7. Hypothyroidism, unspecified type Most recent TSH within goal range.  Continue levothyroxine 100 mcg daily at bedtime.  Plan to continue annual TSH levels. TSH  Date/Time Value Ref Range Status  11/18/2021 12:00 AM 0.83 0.41 - 5.90 Final  10/09/2020 12:19 AM 0.615 0.350 - 4.500 uIU/mL Final    Comment:    Performed by a 3rd Generation assay with a functional sensitivity of <=0.01 uIU/mL. Performed at [facility], [address] [place], [place] [address]   ]  8. Severe aortic stenosis Patient with severe aortic stenosis continue anticoagulation.  Continue atorvastatin 10 mg daily.  No symptoms at this time.  Patient is currently DNR which is appropriate given severe stenosis and concern for inability to recover in the setting of attempting resuscitation.  9. LAFB (left anterior fascicular block) Known EKG finding continue to monitor  10. Heart block AV complete ([place]) Pacemaker evaluated every 6 months.  Continue to monitor  11. Benign essential hypertension Blood pressure slightly elevated today however in general blood pressure within good control.  Continue metoprolol 25 mg daily.  Continue Lasix 20 mg daily.  12. PAF (paroxysmal atrial fibrillation) ([place]) Plan to decrease Eliquis 2.5 twice daily, rate well-controlled on current regimen.  13. Primary hypertension Blood pressure slightly  above goal at this time.  Typically well-controlled on metoprolol 25 mg and Lasix 20 mg daily.  Continue  14. DNR (do not resuscitate) Patient is DNR status with documentation appropriate under [facility]  15. Chronic diastolic (congestive) heart failure (HCC) Volume status well-controlled on Lasix 20 mg daily.  Continue metoprolol 25 mg daily.  Continue to monitor for changes in volume status.  16. Exudative age-related macular degeneration of right eye, unspecified stage [facility]) Patient followed by ophthalmology.  17. BMI 40.0-44.9, adult ([place]) Patient's A1c is decreased to 32.38 in recent years, will continue to monitor for weight loss.  Weight has been stable for the past year.  Continue to monitor   Family/ staff Communication: nursing  Labs/tests ordered:  none

## 2022-02-05 DIAGNOSIS — Z66 Do not resuscitate: Secondary | ICD-10-CM | POA: Insufficient documentation

## 2022-02-05 DIAGNOSIS — I5032 Chronic diastolic (congestive) heart failure: Secondary | ICD-10-CM | POA: Insufficient documentation

## 2022-02-09 DIAGNOSIS — B351 Tinea unguium: Secondary | ICD-10-CM | POA: Diagnosis not present

## 2022-02-09 DIAGNOSIS — E1159 Type 2 diabetes mellitus with other circulatory complications: Secondary | ICD-10-CM | POA: Diagnosis not present

## 2022-02-17 ENCOUNTER — Other Ambulatory Visit: Payer: Self-pay | Admitting: Nurse Practitioner

## 2022-02-17 DIAGNOSIS — M179 Osteoarthritis of knee, unspecified: Secondary | ICD-10-CM

## 2022-02-17 MED ORDER — HYDROCODONE-ACETAMINOPHEN 7.5-325 MG PO TABS: ORAL_TABLET | ORAL | 0 refills | Status: DC

## 2022-02-22 DIAGNOSIS — H31011 Macula scars of posterior pole (postinflammatory) (post-traumatic), right eye: Secondary | ICD-10-CM | POA: Diagnosis not present

## 2022-03-25 ENCOUNTER — Encounter: Payer: Self-pay | Admitting: Student

## 2022-03-25 ENCOUNTER — Non-Acute Institutional Stay (SKILLED_NURSING_FACILITY): Payer: Medicare PPO | Admitting: Student

## 2022-03-25 DIAGNOSIS — I442 Atrioventricular block, complete: Secondary | ICD-10-CM | POA: Diagnosis not present

## 2022-03-25 DIAGNOSIS — I480 Paroxysmal atrial fibrillation: Secondary | ICD-10-CM | POA: Diagnosis not present

## 2022-03-25 DIAGNOSIS — E119 Type 2 diabetes mellitus without complications: Secondary | ICD-10-CM

## 2022-03-25 DIAGNOSIS — I10 Essential (primary) hypertension: Secondary | ICD-10-CM

## 2022-03-25 DIAGNOSIS — I5032 Chronic diastolic (congestive) heart failure: Secondary | ICD-10-CM

## 2022-03-25 DIAGNOSIS — I444 Left anterior fascicular block: Secondary | ICD-10-CM

## 2022-03-25 DIAGNOSIS — I350 Nonrheumatic aortic (valve) stenosis: Secondary | ICD-10-CM

## 2022-03-25 DIAGNOSIS — E039 Hypothyroidism, unspecified: Secondary | ICD-10-CM | POA: Diagnosis not present

## 2022-03-25 DIAGNOSIS — F0150 Vascular dementia without behavioral disturbance: Secondary | ICD-10-CM

## 2022-03-25 DIAGNOSIS — N1832 Chronic kidney disease, stage 3b: Secondary | ICD-10-CM

## 2022-03-25 DIAGNOSIS — D500 Iron deficiency anemia secondary to blood loss (chronic): Secondary | ICD-10-CM

## 2022-03-25 NOTE — Progress Notes (Unsigned)
Location:  Other [place].  Nursing Home Room Number: [facility] [address]A Place of Service:  SNF [phone]) Provider:  Dewayne Shorter, MD  Patient Care Team: Dewayne Shorter, MD as PCP - General [facility])  Extended Emergency Contact Information Primary Emergency Contact: Lazarus Salines Address: [address]          [place], [place] [address] Johnnette Litter of [place] Phone: [phone] Work Phone: [phone] Mobile Phone: [phone] Relation: Daughter Secondary Emergency Contact: Delena Serve Mobile Phone: [phone] Relation: Son  Code Status:  DNR Goals of care: Advanced Directive information    03/25/2022    8:59 AM  Advanced Directives  Does Patient Have a Medical Advance Directive? Yes  Type of Advance Directive Out of facility DNR (pink MOST or yellow form)  Does patient want to make changes to medical advance directive? No - Patient declined     Chief Complaint  Patient presents with   Acute Visit    Dementia    HPI:  Pt is a 89 y.o. female seen today for an acute visit for    Past Medical History:  Diagnosis Date   Anemia    iron def.anemia   Aortic stenosis, severe    Asthma    Chest pain    Colon cancer (HCC)    Complication of anesthesia    nausea   Diabetes mellitus without complication (HCC)    Dyslipidemia    Dyspnea    Gout, joint    H/O postmenopausal osteoporosis    Hematuria    History of palpitations    Hx of colonic polyps    Hypertension    Hypothyroidism    IBS (irritable bowel syndrome)    Schatzki's ring    Sleep apnea    Past Surgical History:  Procedure Laterality Date   AORTIC VALVE REPLACEMENT     CARDIAC PACEMAKER PLACEMENT     CHOLECYSTECTOMY     COLON SURGERY  1997   RIGHT HEMICOLECTOMY   COLONOSCOPY     COLONOSCOPY WITH PROPOFOL N/A 09/07/2016   Procedure: COLONOSCOPY WITH PROPOFOL;  Surgeon: Manya Silvas, MD;  Location: [facility] ENDOSCOPY;  Service: Endoscopy;  Laterality: N/A;   COLONOSCOPY  WITH PROPOFOL N/A 10/11/2020   Procedure: COLONOSCOPY WITH PROPOFOL;  Surgeon: Lin Landsman, MD;  Location: [facility] ENDOSCOPY;  Service: Gastroenterology;  Laterality: N/A;   ESOPHAGOGASTRODUODENOSCOPY     ESOPHAGOGASTRODUODENOSCOPY (EGD) WITH PROPOFOL N/A 10/11/2020   Procedure: ESOPHAGOGASTRODUODENOSCOPY (EGD) WITH PROPOFOL;  Surgeon: Lin Landsman, MD;  Location: [place];  Service: Gastroenterology;  Laterality: N/A;   GIVENS CAPSULE STUDY  10/11/2020   Procedure: GIVENS CAPSULE STUDY;  Surgeon: Lin Landsman, MD;  Location: [facility] ENDOSCOPY;  Service: Gastroenterology;;   HIP ARTHROPLASTY Left 09/05/2019   Procedure: ARTHROPLASTY BIPOLAR HIP (HEMIARTHROPLASTY);  Surgeon: Corky Mull, MD;  Location: [facility] ORS;  Service: Orthopedics;  Laterality: Left;   TEE WITHOUT CARDIOVERSION  03/08/2013    Allergies  Allergen Reactions   Amoxicillin Itching and Rash    Breaks out in a rash On hands On hands    Erythromycin Nausea And Vomiting and Other (See Comments)   Clindamycin/Lincomycin    Tramadol    Vicodin [Hydrocodone-Acetaminophen] Nausea And Vomiting    Outpatient Encounter Medications as of 03/25/2022  Medication Sig   acetaminophen (TYLENOL) 500 MG tablet Take 500 mg by mouth 3 (three) times daily.   albuterol (VENTOLIN HFA) 108 (90 Base) MCG/ACT inhaler Inhale 2 puffs into the lungs every 6 (six) hours as  needed for wheezing or shortness of breath.   atorvastatin (LIPITOR) 10 MG tablet Take 10 mg by mouth daily.   bisacodyl (DULCOLAX) 10 MG suppository Place 10 mg rectally daily as needed for moderate constipation.   bismuth subsalicylate (PEPTO BISMOL) 262 MG/15ML suspension Take 30 mLs by mouth every 4 (four) hours as needed.   cetirizine (ZYRTEC) 10 MG tablet Take 10 mg by mouth daily.   Cholecalciferol (VITAMIN D3) 50 MCG (2000 UT) TABS Take 2,000 Units by mouth daily.   citalopram (CELEXA) 10 MG tablet Take 10 mg by mouth daily. Time of day not specified per  [place] (see other listings)   diclofenac Sodium (VOLTAREN) 1 % GEL 4 g 4 (four) times daily. To left knee   ELIQUIS 5 MG TABS tablet Take 5 mg by mouth 2 (two) times daily.   ferrous sulfate 325 (65 FE) MG tablet Take 1 tablet (325 mg total) by mouth daily with breakfast.   furosemide (LASIX) 20 MG tablet Take 20 mg by mouth daily.   HYDROcodone-acetaminophen (NORCO) 7.5-325 MG tablet 1 tablet twice daily scheduled with additional tablet as needed for pain   levothyroxine (SYNTHROID) 100 MCG tablet Take 100 mcg by mouth at bedtime.   Lidocaine-Collagen-Aloe Vera 2 % GEL Apply 1 Application topically every 2 (two) hours as needed (to lower lip for lip pain).   melatonin 5 MG TABS Take 5 mg by mouth at bedtime.   metoprolol succinate (TOPROL-XL) 25 MG 24 hr tablet Take 25 mg by mouth daily.   Multiple Vitamin (MULTIVITAMIN) tablet Take 1 tablet by mouth daily.   nystatin (MYCOSTATIN/NYSTOP) powder Apply 1 Application topically every 12 (twelve) hours as needed (to groin area for skin breakdown).   ondansetron (ZOFRAN) 4 MG tablet Take 4 mg by mouth every 6 (six) hours as needed for nausea or vomiting.   pantoprazole (PROTONIX) 40 MG tablet Take 1 tablet (40 mg total) by mouth 2 (two) times daily.   polyethylene glycol powder (GLYCOLAX/MIRALAX) 17 GM/SCOOP powder Take 1 Container by mouth every other day.   potassium chloride (KLOR-CON M) 10 MEQ tablet Take 10 mEq by mouth daily.   senna-docusate (SENOKOT-S) 8.6-50 MG tablet Take 2 tablets by mouth at bedtime.   triamcinolone cream (KENALOG) 0.1 % Apply 1 Application topically every 8 (eight) hours as needed (for itchy skin and legs).   No facility-administered encounter medications on file as of 03/25/2022.    Review of Systems  Immunization History  Administered Date(s) Administered   Influenza Inj Mdck Quad Pf 10/29/2015, 11/03/2017, 10/30/2018   Influenza-Unspecified 10/26/2014, 11/10/2016, 11/23/2021    Moderna Covid-19 Vaccine Bivalent Booster 79yr & up 07/06/2021   Moderna SARS-COV2 Booster Vaccination 12/25/2019   Moderna Sars-Covid-2 Vaccination 02/20/2019, 03/20/2019, 12/17/2021   PFIZER(Purple Top)SARS-COV-2 Vaccination 08/07/2019, 08/30/2019   Pfizer Covid-19 Vaccine Bivalent Booster 166yr& up 10/30/2020   Pneumococcal Polysaccharide-23 04/24/2020   Zoster Recombinat (Shingrix) 10/20/2017, 12/21/2017   Pertinent  Health Maintenance Due  Topic Date Due   FOOT EXAM  Never done   OPHTHALMOLOGY EXAM  Never done   DEXA SCAN  Never done   HEMOGLOBIN A1C  06/20/2022   INFLUENZA VACCINE  Completed      10/11/2020    7:52 AM 10/11/2020    9:30 PM 10/12/2020   10:54 AM 10/12/2020    8:00 PM 10/13/2020    8:22 AM  Fall Risk  (RETIRED) Patient Fall Risk Level High fall risk High fall risk High  fall risk High fall risk High fall risk   Functional Status Survey:    Vitals:   03/25/22 0847  BP: (!) 117/58  Pulse: 69  Resp: 16  Temp: (!) 97.5 F (36.4 C)  SpO2: 95%  Weight: 192 lb 3.2 oz (87.2 kg)  Height: 5' 5"$  (1.651 m)   Body mass index is 31.98 kg/m. Physical Exam  Labs reviewed: Recent Labs    09/16/21 0000 11/18/21 0000 12/09/21 0000  NA 138 139 141  K 4.6 4.3 4.8  CL 108 109* 108  CO2 24* 23* 26*  BUN 19 28* 24*  CREATININE 1.1 1.2* 1.2*  CALCIUM 9.4 9.5 9.4   Recent Labs    09/16/21 0000 11/18/21 0000 12/09/21 0000  AST  --  15 15  ALT  --  8 9  ALKPHOS  --  81  --   ALBUMIN 3.1* 3.6 3.4*   Recent Labs    11/18/21 0000 12/09/21 0000  WBC 6.1 5.8  NEUTROABS 3,270.00 2,778.00  HGB 1.0* 10.8*  HCT 34* 33*  PLT 181 183   Lab Results  Component Value Date   TSH 0.83 11/18/2021   Lab Results  Component Value Date   HGBA1C 5.8 12/20/2021   Lab Results  Component Value Date   CHOL 131 12/09/2021   HDL 40 12/09/2021   LDLCALC 64 12/09/2021   TRIG 206 (A) 12/09/2021    Significant Diagnostic Results in last 30 days:  No results  found.  Assessment/Plan There are no diagnoses linked to this encounter.   Family/ staff Communication: ***  Labs/tests ordered:  ***

## 2022-03-28 ENCOUNTER — Telehealth: Payer: Self-pay | Admitting: Student

## 2022-03-28 NOTE — Telephone Encounter (Signed)
Patient's daughter requested an update on patient. Unable to get in touch, left voicemail.

## 2022-03-30 ENCOUNTER — Other Ambulatory Visit: Payer: Self-pay | Admitting: Nurse Practitioner

## 2022-03-30 DIAGNOSIS — M179 Osteoarthritis of knee, unspecified: Secondary | ICD-10-CM

## 2022-03-30 NOTE — Telephone Encounter (Signed)
Patient has request refill on medication Hydrocodone. Patient last refill dated 02/17/2022. Medication pend and sent to PCP Dewayne Shorter, MD for approval/denial.

## 2022-03-31 ENCOUNTER — Other Ambulatory Visit: Payer: Self-pay | Admitting: Nurse Practitioner

## 2022-03-31 DIAGNOSIS — M179 Osteoarthritis of knee, unspecified: Secondary | ICD-10-CM

## 2022-03-31 MED ORDER — HYDROCODONE-ACETAMINOPHEN 7.5-325 MG PO TABS: ORAL_TABLET | ORAL | 0 refills | Status: DC

## 2022-04-19 DIAGNOSIS — E1159 Type 2 diabetes mellitus with other circulatory complications: Secondary | ICD-10-CM | POA: Diagnosis not present

## 2022-04-19 DIAGNOSIS — B351 Tinea unguium: Secondary | ICD-10-CM | POA: Diagnosis not present

## 2022-04-19 LAB — HM DIABETES FOOT EXAM

## 2022-04-21 DIAGNOSIS — D649 Anemia, unspecified: Secondary | ICD-10-CM | POA: Diagnosis not present

## 2022-04-21 LAB — BASIC METABOLIC PANEL
BUN: 21 (ref 4–21)
CO2: 25 — AB (ref 13–22)
Chloride: 106 (ref 99–108)
Creatinine: 1.2 — AB (ref 0.5–1.1)
Glucose: 75
Potassium: 4.4 mEq/L (ref 3.5–5.1)
Sodium: 138 (ref 137–147)

## 2022-04-21 LAB — CBC: RBC: 3.55 — AB (ref 3.87–5.11)

## 2022-04-21 LAB — HEPATIC FUNCTION PANEL
ALT: 9 U/L (ref 7–35)
AST: 17 (ref 13–35)
Alkaline Phosphatase: 73 (ref 25–125)
Bilirubin, Total: 0.6

## 2022-04-21 LAB — CBC AND DIFFERENTIAL
HCT: 33 — AB (ref 36–46)
Hemoglobin: 10.5 — AB (ref 12.0–16.0)
Neutrophils Absolute: 2630
Platelets: 165 10*3/uL (ref 150–400)
WBC: 5.4

## 2022-04-21 LAB — IRON,TIBC AND FERRITIN PANEL
%SAT: 12
Iron: 30
TIBC: 251

## 2022-04-21 LAB — COMPREHENSIVE METABOLIC PANEL
Albumin: 3.4 — AB (ref 3.5–5.0)
Calcium: 9.2 (ref 8.7–10.7)
Globulin: 2.7

## 2022-04-25 DIAGNOSIS — Z20818 Contact with and (suspected) exposure to other bacterial communicable diseases: Secondary | ICD-10-CM | POA: Diagnosis not present

## 2022-04-25 DIAGNOSIS — B950 Streptococcus, group A, as the cause of diseases classified elsewhere: Secondary | ICD-10-CM | POA: Diagnosis not present

## 2022-05-17 ENCOUNTER — Encounter: Payer: Self-pay | Admitting: Nurse Practitioner

## 2022-05-17 ENCOUNTER — Non-Acute Institutional Stay (SKILLED_NURSING_FACILITY): Payer: Medicare PPO | Admitting: Nurse Practitioner

## 2022-05-17 DIAGNOSIS — R319 Hematuria, unspecified: Secondary | ICD-10-CM | POA: Diagnosis not present

## 2022-05-17 DIAGNOSIS — F0150 Vascular dementia without behavioral disturbance: Secondary | ICD-10-CM | POA: Diagnosis not present

## 2022-05-17 DIAGNOSIS — F325 Major depressive disorder, single episode, in full remission: Secondary | ICD-10-CM

## 2022-05-17 DIAGNOSIS — E119 Type 2 diabetes mellitus without complications: Secondary | ICD-10-CM | POA: Diagnosis not present

## 2022-05-17 DIAGNOSIS — I480 Paroxysmal atrial fibrillation: Secondary | ICD-10-CM | POA: Diagnosis not present

## 2022-05-17 DIAGNOSIS — M179 Osteoarthritis of knee, unspecified: Secondary | ICD-10-CM | POA: Diagnosis not present

## 2022-05-17 DIAGNOSIS — I5032 Chronic diastolic (congestive) heart failure: Secondary | ICD-10-CM | POA: Diagnosis not present

## 2022-05-17 DIAGNOSIS — N1832 Chronic kidney disease, stage 3b: Secondary | ICD-10-CM

## 2022-05-17 DIAGNOSIS — H353210 Exudative age-related macular degeneration, right eye, stage unspecified: Secondary | ICD-10-CM

## 2022-05-17 DIAGNOSIS — D6869 Other thrombophilia: Secondary | ICD-10-CM

## 2022-05-17 DIAGNOSIS — I10 Essential (primary) hypertension: Secondary | ICD-10-CM | POA: Diagnosis not present

## 2022-05-17 DIAGNOSIS — E039 Hypothyroidism, unspecified: Secondary | ICD-10-CM

## 2022-05-17 MED ORDER — HYDROCODONE-ACETAMINOPHEN 5-325 MG PO TABS
1.0000 | ORAL_TABLET | Freq: Two times a day (BID) | ORAL | 0 refills | Status: DC
Start: 2022-05-17 — End: 2022-05-31

## 2022-05-17 NOTE — Progress Notes (Unsigned)
Location:  Other [facility]) Nursing Home Room Number: 410 A Place of Service:  SNF (31)  Krystal Warwick K. Janyth Contes, NP   Patient Care Team: Earnestine Mealing, MD as PCP - General [facility])  Extended Emergency Contact Information Primary Emergency Contact: Okey Dupre Address: [address]          [place], [place] [address] Darden Amber of [place] Home Phone: [phone] Work Phone: [phone] Mobile Phone: [phone] Relation: Daughter Secondary Emergency Contact: Donata Duff Mobile Phone: [phone] Relation: Son  Goals of care: Advanced Directive information    05/17/2022    3:08 PM  Advanced Directives  Does Patient Have a Medical Advance Directive? Yes  Type of Advance Directive Out of facility DNR (pink MOST or yellow form)  Does patient want to make changes to medical advance directive? No - Patient declined  Pre-existing out of facility DNR order (yellow form or pink MOST form) Yellow form placed in chart (order not valid for inpatient use);Pink MOST form placed in chart (order not valid for inpatient use)     Chief Complaint  Patient presents with   Medical Management of Chronic Issues    Routine visit. Discuss need for AWV, eye exam, td/tdap, DEXA, pneumonia vaccines, and additional covid boosters or post pone if patient refuses or is not a candidate.     HPI:  Pt is a 89 y.o. female seen today for medical management of chronic disease.  Nursing noted scant blood on brief. No blood in urine or stools noted. Skin intact to peri area. No pain reported.  She has not had any other acute issues, mentation at baseline. Self propels in the halls.  She complains about knee pain when transferring otherwise without increase in complaints. GDR of her scheduled hydrocodone has been tolerated. Without increase in pain noted.  No issues with behaviors. Sleeps well.  Eating well at this time   Past Medical History:  Diagnosis Date   Anemia    iron def.anemia    Aortic stenosis, severe    Asthma    Chest pain    Colon cancer    Complication of anesthesia    nausea   Diabetes mellitus without complication    Dyslipidemia    Dyspnea    Gout, joint    H/O postmenopausal osteoporosis    Hematuria    History of palpitations    Hx of colonic polyps    Hypertension    Hypothyroidism    IBS (irritable bowel syndrome)    Schatzki's ring    Sleep apnea    Past Surgical History:  Procedure Laterality Date   AORTIC VALVE REPLACEMENT     CARDIAC PACEMAKER PLACEMENT     CHOLECYSTECTOMY     COLON SURGERY  1997   RIGHT HEMICOLECTOMY   COLONOSCOPY     COLONOSCOPY WITH PROPOFOL N/A 09/07/2016   Procedure: COLONOSCOPY WITH PROPOFOL;  Surgeon: Scot Jun, MD;  Location: [facility] ENDOSCOPY;  Service: Endoscopy;  Laterality: N/A;   COLONOSCOPY WITH PROPOFOL N/A 10/11/2020   Procedure: COLONOSCOPY WITH PROPOFOL;  Surgeon: Toney Reil, MD;  Location: [facility] ENDOSCOPY;  Service: Gastroenterology;  Laterality: N/A;   ESOPHAGOGASTRODUODENOSCOPY     ESOPHAGOGASTRODUODENOSCOPY (EGD) WITH PROPOFOL N/A 10/11/2020   Procedure: ESOPHAGOGASTRODUODENOSCOPY (EGD) WITH PROPOFOL;  Surgeon: Toney Reil, MD;  Location: [facility] ENDOSCOPY;  Service: Gastroenterology;  Laterality: N/A;   GIVENS CAPSULE STUDY  10/11/2020   Procedure: GIVENS CAPSULE STUDY;  Surgeon: Toney Reil, MD;  Location: [facility] ENDOSCOPY;  Service: Gastroenterology;;  HIP ARTHROPLASTY Left 09/05/2019   Procedure: ARTHROPLASTY BIPOLAR HIP (HEMIARTHROPLASTY);  Surgeon: Christena Flake, MD;  Location: [facility] ORS;  Service: Orthopedics;  Laterality: Left;   TEE WITHOUT CARDIOVERSION  03/08/2013    Allergies  Allergen Reactions   Amoxicillin Itching and Rash    Breaks out in a rash On hands On hands    Erythromycin Nausea And Vomiting and Other (See Comments)   Clindamycin/Lincomycin    Tramadol     Outpatient Encounter Medications as of 05/17/2022  Medication Sig   acetaminophen  (TYLENOL) 500 MG tablet Take 500 mg by mouth 3 (three) times daily.   albuterol (VENTOLIN HFA) 108 (90 Base) MCG/ACT inhaler Inhale 2 puffs into the lungs every 6 (six) hours as needed for wheezing or shortness of breath.   atorvastatin (LIPITOR) 10 MG tablet Take 10 mg by mouth daily.   bisacodyl (DULCOLAX) 10 MG suppository Place 10 mg rectally daily as needed for moderate constipation.   bismuth subsalicylate (PEPTO BISMOL) 262 MG/15ML suspension Take 30 mLs by mouth every 4 (four) hours as needed.   cetirizine (ZYRTEC) 10 MG tablet Take 10 mg by mouth daily.   Cholecalciferol (VITAMIN D3) 50 MCG (2000 UT) TABS Take 2,000 Units by mouth daily.   citalopram (CELEXA) 10 MG tablet Take 10 mg by mouth daily. Time of day not specified per [facility] EMR system [facility] [employer] [facility] (see other listings)   diclofenac Sodium (VOLTAREN) 1 % GEL 4 g 4 (four) times daily. To left knee   ELIQUIS 5 MG TABS tablet Take 5 mg by mouth 2 (two) times daily.   ferrous sulfate 325 (65 FE) MG tablet Take 1 tablet (325 mg total) by mouth daily with breakfast.   furosemide (LASIX) 20 MG tablet Take 20 mg by mouth daily.   HYDROcodone-acetaminophen (NORCO) 7.5-325 MG tablet TAKE 1 TABLET BY MOUTH TWICE DAILY;TAKE 1 TABLET ADDITIONAL BY MOUTH AS NEEDED FOR PAIN   levothyroxine (SYNTHROID) 100 MCG tablet Take 100 mcg by mouth at bedtime.   Lidocaine-Collagen-Aloe Vera 2 % GEL Apply 1 Application topically every 2 (two) hours as needed (to lower lip for lip pain).   melatonin 5 MG TABS Take 5 mg by mouth at bedtime.   metoprolol succinate (TOPROL-XL) 25 MG 24 hr tablet Take 25 mg by mouth daily.   Multiple Vitamin (MULTIVITAMIN) tablet Take 1 tablet by mouth daily.   nystatin (MYCOSTATIN/NYSTOP) powder Apply 1 Application topically every 12 (twelve) hours as needed (to groin area for skin breakdown).   ondansetron (ZOFRAN) 4 MG tablet Take 4 mg by mouth every 6 (six) hours as needed for nausea or vomiting.   pantoprazole  (PROTONIX) 40 MG tablet Take 1 tablet (40 mg total) by mouth 2 (two) times daily.   polyethylene glycol powder (GLYCOLAX/MIRALAX) 17 GM/SCOOP powder Take 1 Container by mouth every other day.   potassium chloride (KLOR-CON M) 10 MEQ tablet Take 10 mEq by mouth daily.   senna-docusate (SENOKOT-S) 8.6-50 MG tablet Take 2 tablets by mouth at bedtime.   triamcinolone cream (KENALOG) 0.1 % Apply 1 Application topically every 8 (eight) hours as needed (for itchy skin and legs).   No facility-administered encounter medications on file as of 05/17/2022.    Review of Systems  Unable to perform ROS: Dementia     Immunization History  Administered Date(s) Administered   Influenza Inj Mdck Quad Pf 10/29/2015, 11/03/2017, 10/30/2018   Influenza-Unspecified 10/26/2014, 11/10/2016, 11/23/2021   Moderna Covid-19 Vaccine Bivalent Booster 76yrs & up  07/06/2021   Moderna SARS-COV2 Booster Vaccination 12/25/2019   Moderna Sars-Covid-2 Vaccination 02/20/2019, 03/20/2019, 12/17/2021   PFIZER(Purple Top)SARS-COV-2 Vaccination 08/07/2019, 08/30/2019   Pfizer Covid-19 Vaccine Bivalent Booster 29yrs & up 10/30/2020   Pneumococcal Polysaccharide-23 04/24/2020   Zoster Recombinat (Shingrix) 10/20/2017, 12/21/2017   Pertinent  Health Maintenance Due  Topic Date Due   OPHTHALMOLOGY EXAM  Never done   DEXA SCAN  Never done   HEMOGLOBIN A1C  06/20/2022   INFLUENZA VACCINE  09/08/2022   FOOT EXAM  04/19/2023      10/11/2020    7:52 AM 10/11/2020    9:30 PM 10/12/2020   10:54 AM 10/12/2020    8:00 PM 10/13/2020    8:22 AM  Fall Risk  (RETIRED) Patient Fall Risk Level High fall risk High fall risk High fall risk High fall risk High fall risk   Functional Status Survey:    Vitals:   05/17/22 1441  BP: 122/75  Pulse: 72  Weight: 195 lb (88.5 kg)  Height: 5\' 5"  (1.651 m)   Body mass index is 32.45 kg/m. Physical Exam Constitutional:      General: She is not in acute distress.    Appearance: She is  well-developed. She is not diaphoretic.  HENT:     Head: Normocephalic and atraumatic.     Mouth/Throat:     Pharynx: No oropharyngeal exudate.  Eyes:     Conjunctiva/sclera: Conjunctivae normal.     Pupils: Pupils are equal, round, and reactive to light.  Cardiovascular:     Rate and Rhythm: Normal rate and regular rhythm.     Heart sounds: Normal heart sounds.  Pulmonary:     Effort: Pulmonary effort is normal.     Breath sounds: Normal breath sounds.  Abdominal:     General: Bowel sounds are normal.     Palpations: Abdomen is soft.  Musculoskeletal:     Cervical back: Normal range of motion and neck supple.     Right lower leg: No edema.     Left lower leg: No edema.  Skin:    General: Skin is warm and dry.  Neurological:     Mental Status: She is alert.  Psychiatric:        Mood and Affect: Mood normal.     Labs reviewed: Recent Labs    11/18/21 0000 12/09/21 0000 04/21/22 0000  NA 139 141 138  K 4.3 4.8 4.4  CL 109* 108 106  CO2 23* 26* 25*  BUN 28* 24* 21  CREATININE 1.2* 1.2* 1.2*  CALCIUM 9.5 9.4 9.2   Recent Labs    11/18/21 0000 12/09/21 0000 04/21/22 0000  AST 15 15 17   ALT 8 9 9   ALKPHOS 81  --  73  ALBUMIN 3.6 3.4* 3.4*   Recent Labs    11/18/21 0000 12/09/21 0000 04/21/22 0000  WBC 6.1 5.8 5.4  NEUTROABS 3,270.00 2,778.00 2,630.00  HGB 1.0* 10.8* 10.5*  HCT 34* 33* 33*  PLT 181 183 165   Lab Results  Component Value Date   TSH 0.83 11/18/2021   Lab Results  Component Value Date   HGBA1C 5.8 12/20/2021   Lab Results  Component Value Date   CHOL 131 12/09/2021   HDL 40 12/09/2021   LDLCALC 64 12/09/2021   TRIG 206 (A) 12/09/2021    Significant Diagnostic Results in last 30 days:  No results found.  Assessment/Plan 1. Osteoarthritis of knee, unspecified laterality, unspecified osteoarthritis type -continues on hydrocodone, will continue with  GDR has she has been tolerating well. Will decrease hydrocodone to 5/325 mg BID  at this time  2. Vascular dementia without behavioral disturbance -Stable, no acute changes in cognitive or functional status, continue supportive care with staff and family  3. PAF (paroxysmal atrial fibrillation) Rate controlled, continues on metoprolol   4. Chronic diastolic (congestive) heart failure -euvolemic at his time, continues on metoprolol and lasix  5. Benign essential hypertension -Blood pressure well controlled, goal bp <140/90 Continue current medications and dietary modifications follow metabolic panel  6. Hypothyroidism, unspecified type Continues on synthroid 100 mcg, TSH at goal  7. Stage 3b chronic kidney disease -Chronic and stable Encourage proper hydration Follow metabolic panel Avoid nephrotoxic meds (NSAIDS)  8. Diabetes mellitus type II, non insulin dependent -diet controlled, A1c at goal.   9. Hematuria, unspecified type -nursing noted scant blood to brief and when wiping. No obvious source of bleeding. will have staff get UA C&S.   10. Depression, major, in remission Stable, continues on celexa  11. Secondary hypercoagulable state Due to a fib, continues on eliquis  12. Exudative age-related macular degeneration of right eye, unspecified stage Followed by ophthalmology   Janene HarveyJessica K. Biagio BorgEubanks, AGNP [facility] [phone]

## 2022-05-24 ENCOUNTER — Non-Acute Institutional Stay (SKILLED_NURSING_FACILITY): Payer: Medicare PPO | Admitting: Nurse Practitioner

## 2022-05-24 ENCOUNTER — Encounter: Payer: Self-pay | Admitting: Nurse Practitioner

## 2022-05-24 DIAGNOSIS — K089 Disorder of teeth and supporting structures, unspecified: Secondary | ICD-10-CM

## 2022-05-24 DIAGNOSIS — K0889 Other specified disorders of teeth and supporting structures: Secondary | ICD-10-CM

## 2022-05-24 NOTE — Progress Notes (Unsigned)
Location:  [facility] [place].  Nursing Home Room Number: [facility] [address] Place of Service:  SNF [phone]) Abbey Chatters, NP  PCP: Earnestine Mealing, MD  Patient Care Team: Earnestine Mealing, MD as PCP - General [facility] Medicine)  Extended Emergency Contact Information Primary Emergency Contact: Okey Dupre Address: [address]          [place], [place] [address] D[place] of [place] Home Phone: [phone] Work Phone: [phone] Mobile Phone: [phone] Relation: Daughter Secondary Emergency Contact: Donata Duff Mobile Phone: [phone] Relation: Son  Goals of care: Advanced Directive information    05/24/2022   12:45 PM  Advanced Directives  Does Patient Have a Medical Advance Directive? Yes  Type of Advance Directive Out of facility DNR (pink MOST or yellow form)  Does patient want to make changes to medical advance directive? No - Patient declined     Chief Complaint  Patient presents with   Acute Visit    Loose Tooth    HPI:  Pt is a 89 y.o. female seen today for an acute visit for Loose Tooth.  Staff as noted tooth on upper left was loose and wanted evaluated.  She has some redness noted to gums.  Pt reports she does have some trouble chewing foods due to her dentition.  Upcoming dentist appt scheduled.    Past Medical History:  Diagnosis Date   Anemia    iron def.anemia   Aortic stenosis, severe    Asthma    Chest pain    Colon cancer    Complication of anesthesia    nausea   Diabetes mellitus without complication    Dyslipidemia    Dyspnea    Gout, joint    H/O postmenopausal osteoporosis    Hematuria    History of palpitations    Hx of colonic polyps    Hypertension    Hypothyroidism    IBS (irritable bowel syndrome)    Schatzki's ring    Sleep apnea    Past Surgical History:  Procedure Laterality Date   AORTIC VALVE REPLACEMENT     CARDIAC PACEMAKER PLACEMENT     CHOLECYSTECTOMY     COLON SURGERY  1997   RIGHT  HEMICOLECTOMY   COLONOSCOPY     COLONOSCOPY WITH PROPOFOL N/A 09/07/2016   Procedure: COLONOSCOPY WITH PROPOFOL;  Surgeon: Scot Jun, MD;  Location: [facility] ENDOSCOPY;  Service: Endoscopy;  Laterality: N/A;   COLONOSCOPY WITH PROPOFOL N/A 10/11/2020   Procedure: COLONOSCOPY WITH PROPOFOL;  Surgeon: Toney Reil, MD;  Location: [facility] ENDOSCOPY;  Service: Gastroenterology;  Laterality: N/A;   ESOPHAGOGASTRODUODENOSCOPY     ESOPHAGOGASTRODUODENOSCOPY (EGD) WITH PROPOFOL N/A 10/11/2020   Procedure: ESOPHAGOGASTRODUODENOSCOPY (EGD) WITH PROPOFOL;  Surgeon: Toney Reil, MD;  Location: [facility] ENDOSCOPY;  Service: Gastroenterology;  Laterality: N/A;   GIVENS CAPSULE STUDY  10/11/2020   Procedure: GIVENS CAPSULE STUDY;  Surgeon: Toney Reil, MD;  Location: [facility] ENDOSCOPY;  Service: Gastroenterology;;   HIP ARTHROPLASTY Left 09/05/2019   Procedure: ARTHROPLASTY BIPOLAR HIP (HEMIARTHROPLASTY);  Surgeon: Christena Flake, MD;  Location: [facility] ORS;  Service: Orthopedics;  Laterality: Left;   TEE WITHOUT CARDIOVERSION  03/08/2013    Allergies  Allergen Reactions   Amoxicillin Itching and Rash    Breaks out in a rash On hands On hands    Erythromycin Nausea And Vomiting and Other (See Comments)   Clindamycin/Lincomycin    Tramadol     Outpatient Encounter Medications as of 05/24/2022  Medication Sig   acetaminophen (TYLENOL) 500  MG tablet Take 500 mg by mouth 3 (three) times daily.   albuterol (VENTOLIN HFA) 108 (90 Base) MCG/ACT inhaler Inhale 2 puffs into the lungs every 6 (six) hours as needed for wheezing or shortness of breath.   atorvastatin (LIPITOR) 10 MG tablet Take 10 mg by mouth daily.   bisacodyl (DULCOLAX) 10 MG suppository Place 10 mg rectally daily as needed for moderate constipation.   bismuth subsalicylate (PEPTO BISMOL) 262 MG/15ML suspension Take 30 mLs by mouth every 4 (four) hours as needed.   cetirizine (ZYRTEC) 10 MG tablet Take 10 mg by mouth daily.    Cholecalciferol (VITAMIN D3) 50 MCG (2000 UT) TABS Take 2,000 Units by mouth daily.   citalopram (CELEXA) 10 MG tablet Take 10 mg by mouth daily. Time of day not specified per [facility] EMR system Point [employer] (see other listings)   diclofenac Sodium (VOLTAREN) 1 % GEL 4 g 4 (four) times daily. To left knee   ELIQUIS 5 MG TABS tablet Take 5 mg by mouth 2 (two) times daily.   ferrous sulfate 325 (65 FE) MG tablet Take 1 tablet (325 mg total) by mouth daily with breakfast.   furosemide (LASIX) 20 MG tablet Take 20 mg by mouth daily.   HYDROcodone-acetaminophen (NORCO) 7.5-325 MG tablet Take 1 tablet by mouth daily as needed for moderate pain.   HYDROcodone-acetaminophen (NORCO/VICODIN) 5-325 MG tablet Take 1 tablet by mouth in the morning and at bedtime.   levothyroxine (SYNTHROID) 100 MCG tablet Take 100 mcg by mouth at bedtime.   Lidocaine-Collagen-Aloe Vera 2 % GEL Apply 1 Application topically every 2 (two) hours as needed (to lower lip for lip pain).   melatonin 5 MG TABS Take 5 mg by mouth at bedtime.   metoprolol succinate (TOPROL-XL) 25 MG 24 hr tablet Take 25 mg by mouth daily.   Multiple Vitamin (MULTIVITAMIN) tablet Take 1 tablet by mouth daily.   nystatin (MYCOSTATIN/NYSTOP) powder Apply 1 Application topically every 12 (twelve) hours as needed (to groin area for skin breakdown).   ondansetron (ZOFRAN) 4 MG tablet Take 4 mg by mouth every 6 (six) hours as needed for nausea or vomiting.   pantoprazole (PROTONIX) 40 MG tablet Take 1 tablet (40 mg total) by mouth 2 (two) times daily.   polyethylene glycol powder (GLYCOLAX/MIRALAX) 17 GM/SCOOP powder Take 1 Container by mouth every other day.   potassium chloride (KLOR-CON M) 10 MEQ tablet Take 10 mEq by mouth daily.   senna-docusate (SENOKOT-S) 8.6-50 MG tablet Take 2 tablets by mouth at bedtime.   triamcinolone cream (KENALOG) 0.1 % Apply 1 Application topically every 8 (eight) hours as needed (for itchy skin and legs).   No  facility-administered encounter medications on file as of 05/24/2022.    Review of Systems  Constitutional:  Negative for activity change, appetite change, fatigue, fever and unexpected weight change.  HENT:         Poor dentition.     Immunization History  Administered Date(s) Administered   Influenza Inj Mdck Quad Pf 10/29/2015, 11/03/2017, 10/30/2018   Influenza-Unspecified 10/26/2014, 11/10/2016, 11/23/2021   Moderna Covid-19 Vaccine Bivalent Booster 69yrs & up 07/06/2021   Moderna SARS-COV2 Booster Vaccination 12/25/2019   Moderna Sars-Covid-2 Vaccination 02/20/2019, 03/20/2019, 12/17/2021   PFIZER(Purple Top)SARS-COV-2 Vaccination 08/07/2019, 08/30/2019   Pfizer Covid-19 Vaccine Bivalent Booster 46yrs & up 10/30/2020   Pneumococcal Polysaccharide-23 04/24/2020   Zoster Recombinat (Shingrix) 10/20/2017, 12/21/2017   Pertinent  Health Maintenance Due  Topic Date Due   OPHTHALMOLOGY EXAM  Never done   DEXA SCAN  Never done   HEMOGLOBIN A1C  06/20/2022   INFLUENZA VACCINE  09/08/2022   FOOT EXAM  04/19/2023      10/11/2020    7:52 AM 10/11/2020    9:30 PM 10/12/2020   10:54 AM 10/12/2020    8:00 PM 10/13/2020    8:22 AM  Fall Risk  (RETIRED) Patient Fall Risk Level High fall risk High fall risk High fall risk High fall risk High fall risk   Functional Status Survey:    Vitals:   05/24/22 1237 05/24/22 1249  BP: (!) 143/77 122/75  Pulse: 69   Resp: 18   Temp: (!) 97.1 F (36.2 C)   SpO2: 97%   Weight: 195 lb (88.5 kg)   Height:  (1.651 m)    Body mass index is 32.45 kg/m. Physical Exam Constitutional:      Appearance: Normal appearance.  HENT:     Mouth/Throat:     Dentition: Abnormal dentition.     Pharynx: Oropharynx is clear.  Pulmonary:     Effort: Pulmonary effort is normal.  Neurological:     Mental Status: She is alert. Mental status is at baseline.  Psychiatric:        Mood and Affect: Mood normal.     Labs reviewed: Recent Labs     11/18/21 0000 12/09/21 0000 04/21/22 0000  NA 139 141 138  K 4.3 4.8 4.4  CL 109* 108 106  CO2 23* 26* 25*  BUN 28* 24* 21  CREATININE 1.2* 1.2* 1.2*  CALCIUM 9.5 9.4 9.2   Recent Labs    11/18/21 0000 12/09/21 0000 04/21/22 0000  AST ALT ALKPHOS 81  --  73  ALBUMIN 3.6 3.4* 3.4*   Recent Labs    11/18/21 0000 12/09/21 0000 04/21/22 0000  WBC 6.1 5.8 5.4  NEUTROABS 3,270.00 2,778.00 2,630.00  HGB 1.0* 10.8* 10.5*  HCT 34* 33* 33*  PLT 181 183 165   Lab Results  Component Value Date   TSH 0.83 11/18/2021   Lab Results  Component Value Date   HGBA1C 5.8 12/20/2021   Lab Results  Component Value Date   CHOL 131 12/09/2021   HDL 40 12/09/2021   LDLCALC 64 12/09/2021   TRIG 206 (A) 12/09/2021    Significant Diagnostic Results in last 30 days:  No results found.  Assessment/Plan 1. Loosening of tooth -appt pending with with dentist -dietary change to small bites and soft food at this time  2. Poor dentition -gums red, dentist appt pending -to use Chlorhexidine mouth wash twice daily with good dental hygiene.    Janene Harvey. Biagio Borg [facility] [phone]

## 2022-05-31 ENCOUNTER — Other Ambulatory Visit: Payer: Self-pay | Admitting: Nurse Practitioner

## 2022-05-31 DIAGNOSIS — M179 Osteoarthritis of knee, unspecified: Secondary | ICD-10-CM

## 2022-05-31 MED ORDER — HYDROCODONE-ACETAMINOPHEN 5-325 MG PO TABS
1.0000 | ORAL_TABLET | Freq: Two times a day (BID) | ORAL | 0 refills | Status: DC
Start: 2022-05-31 — End: 2022-06-10

## 2022-06-08 DIAGNOSIS — D649 Anemia, unspecified: Secondary | ICD-10-CM | POA: Diagnosis not present

## 2022-06-08 LAB — CBC AND DIFFERENTIAL
HCT: 31 — AB (ref 36–46)
Hemoglobin: 10 — AB (ref 12.0–16.0)
Neutrophils Absolute: 3045
Platelets: 198 10*3/uL (ref 150–400)
WBC: 5.8

## 2022-06-08 LAB — CBC: RBC: 3.41 — AB (ref 3.87–5.11)

## 2022-06-10 ENCOUNTER — Other Ambulatory Visit: Payer: Self-pay | Admitting: Student

## 2022-06-10 DIAGNOSIS — M179 Osteoarthritis of knee, unspecified: Secondary | ICD-10-CM

## 2022-06-10 MED ORDER — HYDROCODONE-ACETAMINOPHEN 5-325 MG PO TABS
1.0000 | ORAL_TABLET | Freq: Two times a day (BID) | ORAL | 0 refills | Status: DC
Start: 2022-06-10 — End: 2022-07-29

## 2022-06-10 NOTE — Progress Notes (Signed)
Chronic pain medication refill

## 2022-06-16 DIAGNOSIS — E1122 Type 2 diabetes mellitus with diabetic chronic kidney disease: Secondary | ICD-10-CM | POA: Diagnosis not present

## 2022-06-16 DIAGNOSIS — I129 Hypertensive chronic kidney disease with stage 1 through stage 4 chronic kidney disease, or unspecified chronic kidney disease: Secondary | ICD-10-CM | POA: Diagnosis not present

## 2022-06-16 DIAGNOSIS — N1832 Chronic kidney disease, stage 3b: Secondary | ICD-10-CM | POA: Diagnosis not present

## 2022-06-16 LAB — HEMOGLOBIN A1C: Hemoglobin A1C: 5.8

## 2022-06-16 LAB — BASIC METABOLIC PANEL
BUN: 28 — AB (ref 4–21)
CO2: 26 — AB (ref 13–22)
Chloride: 105 (ref 99–108)
Creatinine: 1.3 — AB (ref 0.5–1.1)
Glucose: 103
Potassium: 4.6 mEq/L (ref 3.5–5.1)
Sodium: 139 (ref 137–147)

## 2022-06-16 LAB — COMPREHENSIVE METABOLIC PANEL
Albumin: 3.8 (ref 3.5–5.0)
Calcium: 9.7 (ref 8.7–10.7)
eGFR: 41

## 2022-07-13 ENCOUNTER — Encounter: Payer: Self-pay | Admitting: Student

## 2022-07-13 ENCOUNTER — Non-Acute Institutional Stay (SKILLED_NURSING_FACILITY): Payer: Medicare PPO | Admitting: Student

## 2022-07-13 DIAGNOSIS — I10 Essential (primary) hypertension: Secondary | ICD-10-CM | POA: Diagnosis not present

## 2022-07-13 DIAGNOSIS — E119 Type 2 diabetes mellitus without complications: Secondary | ICD-10-CM | POA: Diagnosis not present

## 2022-07-13 DIAGNOSIS — F325 Major depressive disorder, single episode, in full remission: Secondary | ICD-10-CM | POA: Diagnosis not present

## 2022-07-13 DIAGNOSIS — Z66 Do not resuscitate: Secondary | ICD-10-CM

## 2022-07-13 DIAGNOSIS — F0150 Vascular dementia without behavioral disturbance: Secondary | ICD-10-CM

## 2022-07-13 DIAGNOSIS — I480 Paroxysmal atrial fibrillation: Secondary | ICD-10-CM | POA: Diagnosis not present

## 2022-07-13 DIAGNOSIS — N1832 Chronic kidney disease, stage 3b: Secondary | ICD-10-CM

## 2022-07-13 DIAGNOSIS — I5032 Chronic diastolic (congestive) heart failure: Secondary | ICD-10-CM | POA: Diagnosis not present

## 2022-07-13 DIAGNOSIS — E039 Hypothyroidism, unspecified: Secondary | ICD-10-CM | POA: Diagnosis not present

## 2022-07-13 DIAGNOSIS — M170 Bilateral primary osteoarthritis of knee: Secondary | ICD-10-CM

## 2022-07-13 NOTE — Progress Notes (Signed)
Location:  [facility] [place].  Nursing Home Room Number: [facility] [address] Place of Service:  SNF [phone]) Provider:  Earnestine Mealing, MD  Patient Care Team: Earnestine Mealing, MD as PCP - General [facility] Medicine)  Extended Emergency Contact Information Primary Emergency Contact: Okey Dupre Address: [address]          [place], [place] [address] [place] of [place] Home Phone: [phone] Work Phone: [phone] Mobile Phone: [phone] Relation: Daughter Secondary Emergency Contact: Donata Duff Mobile Phone: [phone] Relation: Son  Code Status:  DNR Goals of care: Advanced Directive information    07/13/2022    9:59 AM  Advanced Directives  Does Patient Have a Medical Advance Directive? Yes  Type of Advance Directive Out of facility DNR (pink MOST or yellow form)  Does patient want to make changes to medical advance directive? No - Patient declined     Chief Complaint  Patient presents with   Medical Management of Chronic Issues    Medical Management of Chronic Issues.     HPI:  Pt is a 89 y.o. female seen today for medical management of chronic diseases.    Patient says she has no concerns. She wants to drink tea and coffee for dinner. Encouraged to drink 8 ounces of water and she did drink more water. Discussed importance of drinking water during warmer months.   Nursing without acute concerns at this time.   Past Medical History:  Diagnosis Date   Anemia    iron def.anemia   Aortic stenosis, severe    Asthma    Chest pain    Colon cancer (HCC)    Complication of anesthesia    nausea   Diabetes mellitus without complication (HCC)    Dyslipidemia    Dyspnea    Gout, joint    H/O postmenopausal osteoporosis    Hematuria    History of palpitations    Hx of colonic polyps    Hypertension    Hypothyroidism    IBS (irritable bowel syndrome)    Schatzki's ring    Sleep apnea    Past Surgical History:  Procedure Laterality Date   AORTIC  VALVE REPLACEMENT     CARDIAC PACEMAKER PLACEMENT     CHOLECYSTECTOMY     COLON SURGERY  1997   RIGHT HEMICOLECTOMY   COLONOSCOPY     COLONOSCOPY WITH PROPOFOL N/A 09/07/2016   Procedure: COLONOSCOPY WITH PROPOFOL;  Surgeon: Scot Jun, MD;  Location: [facility] ENDOSCOPY;  Service: Endoscopy;  Laterality: N/A;   COLONOSCOPY WITH PROPOFOL N/A 10/11/2020   Procedure: COLONOSCOPY WITH PROPOFOL;  Surgeon: Toney Reil, MD;  Location: [facility] ENDOSCOPY;  Service: Gastroenterology;  Laterality: N/A;   ESOPHAGOGASTRODUODENOSCOPY     ESOPHAGOGASTRODUODENOSCOPY (EGD) WITH PROPOFOL N/A 10/11/2020   Procedure: ESOPHAGOGASTRODUODENOSCOPY (EGD) WITH PROPOFOL;  Surgeon: Toney Reil, MD;  Location: [facility] ENDOSCOPY;  Service: Gastroenterology;  Laterality: N/A;   GIVENS CAPSULE STUDY  10/11/2020   Procedure: GIVENS CAPSULE STUDY;  Surgeon: Toney Reil, MD;  Location: [facility] ENDOSCOPY;  Service: Gastroenterology;;   HIP ARTHROPLASTY Left 09/05/2019   Procedure: ARTHROPLASTY BIPOLAR HIP (HEMIARTHROPLASTY);  Surgeon: Christena Flake, MD;  Location: [facility] ORS;  Service: Orthopedics;  Laterality: Left;   TEE WITHOUT CARDIOVERSION  03/08/2013    Allergies  Allergen Reactions   Amoxicillin Itching and Rash    Breaks out in a rash On hands On hands    Erythromycin Nausea And Vomiting and Other (See Comments)   Clindamycin/Lincomycin    Tramadol  Outpatient Encounter Medications as of 07/13/2022  Medication Sig   acetaminophen (TYLENOL) 500 MG tablet Take 500 mg by mouth 3 (three) times daily.   albuterol (VENTOLIN HFA) 108 (90 Base) MCG/ACT inhaler Inhale 2 puffs into the lungs every 6 (six) hours as needed for wheezing or shortness of breath.   atorvastatin (LIPITOR) 10 MG tablet Take 10 mg by mouth daily.   bisacodyl (DULCOLAX) 10 MG suppository Place 10 mg rectally daily as needed for moderate constipation.   bismuth subsalicylate (PEPTO BISMOL) 262 MG/15ML suspension Take 30 mLs by mouth  every 4 (four) hours as needed.   cetirizine (ZYRTEC) 10 MG tablet Take 10 mg by mouth daily.   Cholecalciferol (VITAMIN D3) 50 MCG (2000 UT) TABS Take 2,000 Units by mouth daily.   citalopram (CELEXA) 10 MG tablet Take 10 mg by mouth daily. Time of day not specified per [facility] EMR system Point Click [employer] (see other listings)   diclofenac Sodium (VOLTAREN) 1 % GEL 4 g 4 (four) times daily. To left knee   ELIQUIS 5 MG TABS tablet Take 5 mg by mouth 2 (two) times daily.   ferrous sulfate 325 (65 FE) MG tablet Take 1 tablet (325 mg total) by mouth daily with breakfast.   furosemide (LASIX) 20 MG tablet Take 20 mg by mouth daily.   HYDROcodone-acetaminophen (NORCO) 7.5-325 MG tablet Give one tablet by mouth every 24 hours as needed during the night   HYDROcodone-acetaminophen (NORCO/VICODIN) 5-325 MG tablet Take 1 tablet by mouth in the morning and at bedtime.   levothyroxine (SYNTHROID) 100 MCG tablet Take 100 mcg by mouth at bedtime.   Lidocaine-Collagen-Aloe Vera 2 % GEL Apply 1 Application topically every 2 (two) hours as needed (to lower lip for lip pain).   melatonin 5 MG TABS Take 5 mg by mouth at bedtime.   metoprolol succinate (TOPROL-XL) 25 MG 24 hr tablet Take 25 mg by mouth daily.   Multiple Vitamin (MULTIVITAMIN) tablet Take 1 tablet by mouth daily.   nystatin (MYCOSTATIN/NYSTOP) powder Apply 1 Application topically every 12 (twelve) hours as needed (to groin area for skin breakdown).   ondansetron (ZOFRAN) 4 MG tablet Take 4 mg by mouth every 6 (six) hours as needed for nausea or vomiting.   pantoprazole (PROTONIX) 40 MG tablet Take 1 tablet (40 mg total) by mouth 2 (two) times daily.   polyethylene glycol powder (GLYCOLAX/MIRALAX) 17 GM/SCOOP powder Take 1 Container by mouth every other day.   potassium chloride (KLOR-CON M) 10 MEQ tablet Take 10 mEq by mouth daily.   senna-docusate (SENOKOT-S) 8.6-50 MG tablet Take 2 tablets by mouth at bedtime.   triamcinolone cream (KENALOG)  0.1 % Apply 1 Application topically every 8 (eight) hours as needed (for itchy skin and legs).   No facility-administered encounter medications on file as of 07/13/2022.    Review of Systems  Immunization History  Administered Date(s) Administered   Influenza Inj Mdck Quad Pf 10/29/2015, 11/03/2017, 10/30/2018   Influenza-Unspecified 10/26/2014, 11/10/2016, 11/23/2021   Moderna Covid-19 Vaccine Bivalent Booster 60yrs & up 07/06/2021   Moderna SARS-COV2 Booster Vaccination 12/25/2019   Moderna Sars-Covid-2 Vaccination 02/20/2019, 03/20/2019, 12/17/2021   PFIZER(Purple Top)SARS-COV-2 Vaccination 08/07/2019, 08/30/2019   Pfizer Covid-19 Vaccine Bivalent Booster 71yrs & up 10/30/2020   Pneumococcal Polysaccharide-23 04/24/2020   Zoster Recombinat (Shingrix) 10/20/2017, 12/21/2017   Pertinent  Health Maintenance Due  Topic Date Due   OPHTHALMOLOGY EXAM  Never done   DEXA SCAN  Never done   INFLUENZA VACCINE  09/08/2022   HEMOGLOBIN A1C  12/17/2022   FOOT EXAM  04/19/2023      10/11/2020    9:30 PM 10/12/2020   10:54 AM 10/12/2020    8:00 PM 10/13/2020    8:22 AM 07/21/2022    8:41 PM  Fall Risk  Falls in the past year?     0  Was there an injury with Fall?     0  Fall Risk Category Calculator     0  (RETIRED) Patient Fall Risk Level High fall risk High fall risk High fall risk High fall risk    Functional Status Survey:    Vitals:   07/13/22 0951  BP: 132/84  Pulse: 69  Resp: 19  Temp: (!) 97.2 F (36.2 C)  SpO2: 96%  Weight: 194 lb 3.2 oz (88.1 kg)  Height: 5\' 5"  (1.651 m)   Body mass index is 32.32 kg/m. Physical Exam Constitutional:      Appearance: Normal appearance.  Cardiovascular:     Rate and Rhythm: Normal rate and regular rhythm.  Pulmonary:     Effort: Pulmonary effort is normal.     Breath sounds: Normal breath sounds.  Abdominal:     General: Abdomen is flat.     Palpations: Abdomen is soft.  Neurological:     Mental Status: She is alert.      Comments: Oriented to self, speaking slowly.      Labs reviewed: Recent Labs    12/09/21 0000 04/21/22 0000 06/16/22 0000  NA 141 138 139  K 4.8 4.4 4.6  CL 108 106 105  CO2 26* 25* 26*  BUN 24* 21 28*  CREATININE 1.2* 1.2* 1.3*  CALCIUM 9.4 9.2 9.7   Recent Labs    11/18/21 0000 12/09/21 0000 04/21/22 0000 06/16/22 0000  AST 15 15 17   --   ALT 8 9 9   --   ALKPHOS 81  --  73  --   ALBUMIN 3.6 3.4* 3.4* 3.8   Recent Labs    12/09/21 0000 04/21/22 0000 06/08/22 0000  WBC 5.8 5.4 5.8  NEUTROABS 2,778.00 2,630.00 3,045.00  HGB 10.8* 10.5* 10.0*  HCT 33* 33* 31*  PLT 183 165 198   Lab Results  Component Value Date   TSH 0.83 11/18/2021   Lab Results  Component Value Date   HGBA1C 5.8 06/16/2022   Lab Results  Component Value Date   CHOL 131 12/09/2021   HDL 40 12/09/2021   LDLCALC 64 12/09/2021   TRIG 206 (A) 12/09/2021    Significant Diagnostic Results in last 30 days:  No results found.  Assessment/Plan 1. Vascular dementia without behavioral disturbance (HCC) Continues to have progression of dementia with slowed speech and processing. Weight stable. Eating well. Requires support for transfers. Some concern polypharmacy could be contributing to current state. Dose reduction of pain medication as possible of hydrocodone.   2. PAF (paroxysmal atrial fibrillation) (HCC) CHF  Continue Eliquis 5mg  BID Cr less than 1.5 despite age >48 yo and normal weight. Rate well-controlled. Continue metoprolol 25 daily. Appears euvolemic. Continue lasix as well.   3. Depression, major, in remission (HCC) Patient's mood is well-controlled at this time. Continue Celexa.   4. Benign essential hypertension BP well-controlled with lasix and metoprolol.   5. Diabetes mellitus type II, non insulin dependent (HCC) DM well-controlled most recent A1c 5.8. Continue to monitor as it is diet controlled.   6. Hypothyroidism, unspecified type Well-controlled. Continue  levothyroxine 100 mcg daily.  7. Stage 3b chronic kidney disease (HCC) Stable at this time. Avoid nephrotoxic meds when possible.   8. DNR (do not resuscitate) Patient has DNR status at this time.   9. OA Chronic pain due to arthritis on norco. Will continue trial of dose reduction as tolerated limiting to one tablet in the morning and one at night 5-325.    Family/ staff Communication: nursing  Labs/tests ordered:  none

## 2022-07-21 DIAGNOSIS — F0150 Vascular dementia without behavioral disturbance: Secondary | ICD-10-CM | POA: Insufficient documentation

## 2022-07-29 ENCOUNTER — Other Ambulatory Visit: Payer: Self-pay | Admitting: Student

## 2022-07-29 DIAGNOSIS — M179 Osteoarthritis of knee, unspecified: Secondary | ICD-10-CM

## 2022-07-29 MED ORDER — HYDROCODONE-ACETAMINOPHEN 5-325 MG PO TABS
1.0000 | ORAL_TABLET | Freq: Two times a day (BID) | ORAL | 0 refills | Status: DC
Start: 2022-07-29 — End: 2022-08-29

## 2022-07-29 NOTE — Progress Notes (Signed)
Refill of chronic pain management.

## 2022-08-02 DIAGNOSIS — B351 Tinea unguium: Secondary | ICD-10-CM | POA: Diagnosis not present

## 2022-08-02 DIAGNOSIS — E1159 Type 2 diabetes mellitus with other circulatory complications: Secondary | ICD-10-CM | POA: Diagnosis not present

## 2022-08-26 ENCOUNTER — Encounter: Payer: Self-pay | Admitting: Student

## 2022-08-26 ENCOUNTER — Non-Acute Institutional Stay (SKILLED_NURSING_FACILITY): Payer: Medicare PPO | Admitting: Student

## 2022-08-26 DIAGNOSIS — F0150 Vascular dementia without behavioral disturbance: Secondary | ICD-10-CM | POA: Diagnosis not present

## 2022-08-26 DIAGNOSIS — M170 Bilateral primary osteoarthritis of knee: Secondary | ICD-10-CM

## 2022-08-26 DIAGNOSIS — E119 Type 2 diabetes mellitus without complications: Secondary | ICD-10-CM | POA: Diagnosis not present

## 2022-08-26 DIAGNOSIS — I480 Paroxysmal atrial fibrillation: Secondary | ICD-10-CM | POA: Diagnosis not present

## 2022-08-26 DIAGNOSIS — F325 Major depressive disorder, single episode, in full remission: Secondary | ICD-10-CM

## 2022-08-26 DIAGNOSIS — E039 Hypothyroidism, unspecified: Secondary | ICD-10-CM | POA: Diagnosis not present

## 2022-08-26 DIAGNOSIS — N1832 Chronic kidney disease, stage 3b: Secondary | ICD-10-CM

## 2022-08-26 DIAGNOSIS — I5032 Chronic diastolic (congestive) heart failure: Secondary | ICD-10-CM

## 2022-08-26 NOTE — Progress Notes (Unsigned)
Location:  Other [facility].  Nursing Home Room Number: [facility] [address]A Place of Service:  SNF [id number]) Provider:  Earnestine Mealing, MD  Patient Care Team: Earnestine Mealing, MD as PCP - General [facility] Medicine)  Extended Emergency Contact Information Primary Emergency Contact: Okey Dupre Address: [address]          [place], [place] [address] [place] of [place] Home Phone: [phone] Work Phone: [phone] Mobile Phone: [phone] Relation: Daughter Secondary Emergency Contact: Donata Duff Mobile Phone: [phone] Relation: Son  Code Status:  DNR Goals of care: Advanced Directive information    08/26/2022    9:47 AM  Advanced Directives  Does Patient Have a Medical Advance Directive? Yes  Type of Advance Directive Out of facility DNR (pink MOST or yellow form)  Does patient want to make changes to medical advance directive? No - Patient declined     Chief Complaint  Patient presents with   Medical Management of Chronic Issues    Medical Management of Chronic Issues.     HPI:  Pt is a 89 y.o. female seen today for medical management of chronic diseases.    Patient is yelling and grimacing in pain. She is eating her breakfast. She states her knees hurt badly. She has not had her pain medication at this time.  Nursing without specific concerns at this time.    Past Medical History:  Diagnosis Date   Anemia    iron def.anemia   Aortic stenosis, severe    Asthma    Chest pain    Colon cancer (HCC)    Complication of anesthesia    nausea   Diabetes mellitus without complication (HCC)    Dyslipidemia    Dyspnea    Gout, joint    H/O postmenopausal osteoporosis    Hematuria    History of palpitations    Hx of colonic polyps    Hypertension    Hypothyroidism    IBS (irritable bowel syndrome)    Schatzki's ring    Sleep apnea    Past Surgical History:  Procedure Laterality Date   AORTIC VALVE REPLACEMENT     CARDIAC PACEMAKER PLACEMENT      CHOLECYSTECTOMY     COLON SURGERY  1997   RIGHT HEMICOLECTOMY   COLONOSCOPY     COLONOSCOPY WITH PROPOFOL N/A 09/07/2016   Procedure: COLONOSCOPY WITH PROPOFOL;  Surgeon: Scot Jun, MD;  Location: [facility] ENDOSCOPY;  Service: Endoscopy;  Laterality: N/A;   COLONOSCOPY WITH PROPOFOL N/A 10/11/2020   Procedure: COLONOSCOPY WITH PROPOFOL;  Surgeon: Toney Reil, MD;  Location: [facility] ENDOSCOPY;  Service: Gastroenterology;  Laterality: N/A;   ESOPHAGOGASTRODUODENOSCOPY     ESOPHAGOGASTRODUODENOSCOPY (EGD) WITH PROPOFOL N/A 10/11/2020   Procedure: ESOPHAGOGASTRODUODENOSCOPY (EGD) WITH PROPOFOL;  Surgeon: Toney Reil, MD;  Location: [facility] ENDOSCOPY;  Service: Gastroenterology;  Laterality: N/A;   GIVENS CAPSULE STUDY  10/11/2020   Procedure: GIVENS CAPSULE STUDY;  Surgeon: Toney Reil, MD;  Location: [facility] ENDOSCOPY;  Service: Gastroenterology;;   HIP ARTHROPLASTY Left 09/05/2019   Procedure: ARTHROPLASTY BIPOLAR HIP (HEMIARTHROPLASTY);  Surgeon: Christena Flake, MD;  Location: [facility] ORS;  Service: Orthopedics;  Laterality: Left;   TEE WITHOUT CARDIOVERSION  03/08/2013    Allergies  Allergen Reactions   Amoxicillin Itching and Rash    Breaks out in a rash On hands On hands    Erythromycin Nausea And Vomiting and Other (See Comments)   Clindamycin/Lincomycin    Tramadol     Outpatient Encounter Medications as of  08/26/2022  Medication Sig   acetaminophen (TYLENOL) 500 MG tablet Take 500 mg by mouth 3 (three) times daily.   albuterol (VENTOLIN HFA) 108 (90 Base) MCG/ACT inhaler Inhale 2 puffs into the lungs every 6 (six) hours as needed for wheezing or shortness of breath.   atorvastatin (LIPITOR) 10 MG tablet Take 10 mg by mouth daily.   bisacodyl (DULCOLAX) 10 MG suppository Place 10 mg rectally daily as needed for moderate constipation.   bismuth subsalicylate (PEPTO BISMOL) 262 MG/15ML suspension Take 30 mLs by mouth every 4 (four) hours as needed.   cetirizine  (ZYRTEC) 10 MG tablet Take 10 mg by mouth daily.   Cholecalciferol (VITAMIN D3) 50 MCG (2000 UT) TABS Take 2,000 Units by mouth daily.   citalopram (CELEXA) 10 MG tablet Take 10 mg by mouth daily. Time of day not specified per [facility] EMR system Point Click Care (see other listings)   diclofenac Sodium (VOLTAREN) 1 % GEL 4 g 4 (four) times daily. To left knee   ELIQUIS 5 MG TABS tablet Take 5 mg by mouth 2 (two) times daily.   famotidine (PEPCID) 10 MG tablet Take 10 mg by mouth at bedtime.   ferrous sulfate 325 (65 FE) MG tablet Take 1 tablet (325 mg total) by mouth daily with breakfast.   furosemide (LASIX) 20 MG tablet Take 20 mg by mouth daily.   HYDROcodone-acetaminophen (NORCO) 7.5-325 MG tablet Take 1 tablet by mouth. Every 24 hours as needed.   HYDROcodone-acetaminophen (NORCO/VICODIN) 5-325 MG tablet Take 1 tablet by mouth in the morning and at bedtime.   levothyroxine (SYNTHROID) 100 MCG tablet Take 100 mcg by mouth at bedtime.   Lidocaine-Collagen-Aloe Vera 2 % GEL Apply 1 Application topically every 2 (two) hours as needed (to lower lip for lip pain).   melatonin 5 MG TABS Take 5 mg by mouth at bedtime.   metoprolol succinate (TOPROL-XL) 25 MG 24 hr tablet Take 25 mg by mouth 2 (two) times daily.   Multiple Vitamin (MULTIVITAMIN) tablet Take 1 tablet by mouth daily.   nystatin (MYCOSTATIN/NYSTOP) powder Apply 1 Application topically every 12 (twelve) hours as needed (to groin area for skin breakdown).   ondansetron (ZOFRAN) 4 MG tablet Take 4 mg by mouth every 6 (six) hours as needed for nausea or vomiting.   polyethylene glycol powder (GLYCOLAX/MIRALAX) 17 GM/SCOOP powder Take 1 Container by mouth every other day.   potassium chloride (KLOR-CON M) 10 MEQ tablet Take 10 mEq by mouth daily.   senna-docusate (SENOKOT-S) 8.6-50 MG tablet Take 2 tablets by mouth at bedtime.   triamcinolone cream (KENALOG) 0.1 % Apply 1 Application topically every 8 (eight) hours as needed (for itchy  skin and legs).   [DISCONTINUED] pantoprazole (PROTONIX) 40 MG tablet Take 1 tablet (40 mg total) by mouth 2 (two) times daily.   No facility-administered encounter medications on file as of 08/26/2022.    Review of Systems  Immunization History  Administered Date(s) Administered   Influenza Inj Mdck Quad Pf 10/29/2015, 11/03/2017, 10/30/2018   Influenza-Unspecified 10/26/2014, 11/10/2016, 11/23/2021   Moderna Covid-19 Vaccine Bivalent Booster 96yrs & up 07/06/2021   Moderna SARS-COV2 Booster Vaccination 12/25/2019   Moderna Sars-Covid-2 Vaccination 02/20/2019, 03/20/2019, 12/17/2021   PFIZER(Purple Top)SARS-COV-2 Vaccination 08/07/2019, 08/30/2019   Pfizer Covid-19 Vaccine Bivalent Booster 47yrs & up 10/30/2020   Pneumococcal Polysaccharide-23 04/24/2020   Zoster Recombinant(Shingrix) 10/20/2017, 12/21/2017   Pertinent  Health Maintenance Due  Topic Date Due   OPHTHALMOLOGY EXAM  Never done  DEXA SCAN  Never done   INFLUENZA VACCINE  09/08/2022   HEMOGLOBIN A1C  12/17/2022   FOOT EXAM  04/19/2023      10/11/2020    9:30 PM 10/12/2020   10:54 AM 10/12/2020    8:00 PM 10/13/2020    8:22 AM 07/21/2022    8:41 PM  Fall Risk  Falls in the past year?     0  Was there an injury with Fall?     0  Fall Risk Category Calculator     0  (RETIRED) Patient Fall Risk Level High fall risk High fall risk High fall risk High fall risk    Functional Status Survey:    Vitals:   08/26/22 0939  BP: 125/73  Pulse: 68  Resp: 15  Temp: (!) 97.3 F (36.3 C)  SpO2: 93%  Weight: 195 lb 9.6 oz (88.7 kg)  Height: 5\' 5"  (1.651 m)   Body mass index is 32.55 kg/m. Physical Exam Constitutional:      Appearance: Normal appearance.  Cardiovascular:     Rate and Rhythm: Normal rate.     Pulses: Normal pulses.  Pulmonary:     Effort: Pulmonary effort is normal.  Abdominal:     General: Abdomen is flat.     Palpations: Abdomen is soft.  Skin:    General: Skin is warm and dry.  Neurological:      Mental Status: She is alert. Mental status is at baseline. She is disoriented.     Labs reviewed: Recent Labs    12/09/21 0000 04/21/22 0000 06/16/22 0000  NA 141 138 139  K 4.8 4.4 4.6  CL 108 106 105  CO2 26* 25* 26*  BUN 24* 21 28*  CREATININE 1.2* 1.2* 1.3*  CALCIUM 9.4 9.2 9.7   Recent Labs    11/18/21 0000 12/09/21 0000 04/21/22 0000 06/16/22 0000  AST 15 15 17   --   ALT 8 9 9   --   ALKPHOS 81  --  73  --   ALBUMIN 3.6 3.4* 3.4* 3.8   Recent Labs    12/09/21 0000 04/21/22 0000 06/08/22 0000  WBC 5.8 5.4 5.8  NEUTROABS 2,778.00 2,630.00 3,045.00  HGB 10.8* 10.5* 10.0*  HCT 33* 33* 31*  PLT 183 165 198   Lab Results  Component Value Date   TSH 0.83 11/18/2021   Lab Results  Component Value Date   HGBA1C 5.8 06/16/2022   Lab Results  Component Value Date   CHOL 131 12/09/2021   HDL 40 12/09/2021   LDLCALC 64 12/09/2021   TRIG 206 (A) 12/09/2021    Significant Diagnostic Results in last 30 days:  No results found.  Assessment/Plan 1. Primary osteoarthritis of both knees Patient currently with pain. Improves with chronic use of norco , continue at this time. Consider dose reduction in the future.   2. Depression, major, in remission (HCC) Mood is stable, continue celexa. Consider dose reduction in the future.   3. Vascular dementia without behavioral disturbance (HCC) Continued progressive decline. Patient is talking slower, eating less, and requires crushed medications. At this time, continue supportive care.   4. PAF (paroxysmal atrial fibrillation) (HCC) Rate well-controlled. Patient is stable on eliquis at this time. Continue   5. Diabetes mellitus type II, non insulin dependent (HCC) Most recent A1c 5.8 no medications at this time. Continue to monitor.   6. Hypothyroidism, unspecified type Most recent TSH at goal, continue levothyroxine 100 mcg daily.   7. Stage 3b  chronic kidney disease (HCC) Kidney function remains stable. Avoid  nephrotoxic drugs as able and dose reduction as tolerated.   8. CHF: Patient appears euvolemic on exam. Continue BB, Lasix at this time.  Family/ staff Communication: nursing  Labs/tests ordered:  none

## 2022-08-29 ENCOUNTER — Other Ambulatory Visit: Payer: Self-pay | Admitting: Student

## 2022-08-29 ENCOUNTER — Encounter: Payer: Self-pay | Admitting: Student

## 2022-08-29 DIAGNOSIS — M179 Osteoarthritis of knee, unspecified: Secondary | ICD-10-CM

## 2022-08-29 NOTE — Telephone Encounter (Signed)
Refill of chronic pain medication.

## 2022-09-01 ENCOUNTER — Encounter: Payer: Self-pay | Admitting: Nurse Practitioner

## 2022-09-01 ENCOUNTER — Non-Acute Institutional Stay (INDEPENDENT_AMBULATORY_CARE_PROVIDER_SITE_OTHER): Payer: Medicare PPO | Admitting: Nurse Practitioner

## 2022-09-01 DIAGNOSIS — Z0000 Encounter for general adult medical examination without abnormal findings: Secondary | ICD-10-CM | POA: Diagnosis not present

## 2022-09-01 NOTE — Progress Notes (Signed)
Subjective:   Krystal Goodwin is a 89 y.o. female who presents for Medicare Annual (Subsequent) preventive examination.  Visit Complete: In person at [facility]   Patient Medicare AWV questionnaire was completed by the patient on 09/01/22; I have confirmed that all information answered by patient is correct and no changes since this date.  Review of Systems     Cardiac Risk Factors include: advanced age (>93men, >37 women);sedentary lifestyle;dyslipidemia     Objective:    Today's Vitals   09/01/22 0958  BP: 125/73  Pulse: 68  Resp: 15  Temp: (!) 97.3 F (36.3 C)  SpO2: 93%  Weight: 195 lb 9.6 oz (88.7 kg)  Height: 5\' 5"  (1.651 m)   Body mass index is 32.55 kg/m.     09/01/2022   10:18 AM 08/26/2022    9:47 AM 07/13/2022    9:59 AM 05/24/2022   12:45 PM 05/17/2022    3:08 PM 03/25/2022    8:59 AM 02/04/2022    9:04 AM  Advanced Directives  Does Patient Have a Medical Advance Directive? Yes Yes Yes Yes Yes Yes Yes  Type of Advance Directive Out of facility DNR (pink MOST or yellow form) Out of facility DNR (pink MOST or yellow form) Out of facility DNR (pink MOST or yellow form) Out of facility DNR (pink MOST or yellow form) Out of facility DNR (pink MOST or yellow form) Out of facility DNR (pink MOST or yellow form) Out of facility DNR (pink MOST or yellow form)  Does patient want to make changes to medical advance directive? No - Patient declined No - Patient declined No - Patient declined No - Patient declined No - Patient declined No - Patient declined No - Patient declined  Pre-existing out of facility DNR order (yellow form or pink MOST form)     Yellow form placed in chart (order not valid for inpatient use);Pink MOST form placed in chart (order not valid for inpatient use)      Current Medications (verified) Outpatient Encounter Medications as of 09/01/2022  Medication Sig   acetaminophen (TYLENOL) 500 MG tablet Take 500 mg by mouth 3 (three) times daily.   albuterol  (VENTOLIN HFA) 108 (90 Base) MCG/ACT inhaler Inhale 2 puffs into the lungs every 6 (six) hours as needed for wheezing or shortness of breath.   atorvastatin (LIPITOR) 10 MG tablet Take 10 mg by mouth daily.   bisacodyl (DULCOLAX) 10 MG suppository Place 10 mg rectally daily as needed for moderate constipation.   bismuth subsalicylate (PEPTO BISMOL) 262 MG/15ML suspension Take 30 mLs by mouth every 4 (four) hours as needed.   cetirizine (ZYRTEC) 10 MG tablet Take 10 mg by mouth daily.   Cholecalciferol (VITAMIN D3) 50 MCG (2000 UT) TABS Take 2,000 Units by mouth daily.   citalopram (CELEXA) 10 MG tablet Take 10 mg by mouth daily. Time of day not specified per [facility] EMR system Point Click Care (see other listings)   diclofenac Sodium (VOLTAREN) 1 % GEL 4 g 4 (four) times daily. To left knee   ELIQUIS 5 MG TABS tablet Take 5 mg by mouth 2 (two) times daily.   famotidine (PEPCID) 10 MG tablet Take 10 mg by mouth at bedtime.   ferrous sulfate 325 (65 FE) MG tablet Take 1 tablet (325 mg total) by mouth daily with breakfast.   furosemide (LASIX) 20 MG tablet Take 20 mg by mouth daily.   HYDROcodone-acetaminophen (NORCO) 5-325 MG tablet Take 1 tablet by  mouth in the morning and at bedtime.   HYDROcodone-acetaminophen (NORCO) 7.5-325 MG tablet Take 1 tablet by mouth. Every 24 hours as needed.   levothyroxine (SYNTHROID) 100 MCG tablet Take 100 mcg by mouth at bedtime.   Lidocaine-Collagen-Aloe Vera 2 % GEL Apply 1 Application topically every 2 (two) hours as needed (to lower lip for lip pain).   melatonin 5 MG TABS Take 5 mg by mouth at bedtime.   metoprolol succinate (TOPROL-XL) 25 MG 24 hr tablet Take 25 mg by mouth 2 (two) times daily.   Multiple Vitamin (MULTIVITAMIN) tablet Take 1 tablet by mouth daily.   nystatin (MYCOSTATIN/NYSTOP) powder Apply 1 Application topically every 12 (twelve) hours as needed (to groin area for skin breakdown).   ondansetron (ZOFRAN) 4 MG tablet Take 4 mg by mouth  every 6 (six) hours as needed for nausea or vomiting.   polyethylene glycol powder (GLYCOLAX/MIRALAX) 17 GM/SCOOP powder Take 1 Container by mouth every other day.   potassium chloride (KLOR-CON M) 10 MEQ tablet Take 10 mEq by mouth daily.   senna-docusate (SENOKOT-S) 8.6-50 MG tablet Take 2 tablets by mouth at bedtime.   triamcinolone cream (KENALOG) 0.1 % Apply 1 Application topically every 8 (eight) hours as needed (for itchy skin and legs).   No facility-administered encounter medications on file as of 09/01/2022.    Allergies (verified) Amoxicillin, Erythromycin, Clindamycin/lincomycin, and Tramadol   History: Past Medical History:  Diagnosis Date   Anemia    iron def.anemia   Aortic stenosis, severe    Asthma    Chest pain    Colon cancer (HCC)    Complication of anesthesia    nausea   Diabetes mellitus without complication (HCC)    Dyslipidemia    Dyspnea    Gout, joint    H/O postmenopausal osteoporosis    Hematuria    History of palpitations    Hx of colonic polyps    Hypertension    Hypothyroidism    IBS (irritable bowel syndrome)    Schatzki's ring    Sleep apnea    Past Surgical History:  Procedure Laterality Date   AORTIC VALVE REPLACEMENT     CARDIAC PACEMAKER PLACEMENT     CHOLECYSTECTOMY     COLON SURGERY  1997   RIGHT HEMICOLECTOMY   COLONOSCOPY     COLONOSCOPY WITH PROPOFOL N/A 09/07/2016   Procedure: COLONOSCOPY WITH PROPOFOL;  Surgeon: Scot Jun, MD;  Location: [facility] ENDOSCOPY;  Service: Endoscopy;  Laterality: N/A;   COLONOSCOPY WITH PROPOFOL N/A 10/11/2020   Procedure: COLONOSCOPY WITH PROPOFOL;  Surgeon: Toney Reil, MD;  Location: [facility] ENDOSCOPY;  Service: Gastroenterology;  Laterality: N/A;   ESOPHAGOGASTRODUODENOSCOPY     ESOPHAGOGASTRODUODENOSCOPY (EGD) WITH PROPOFOL N/A 10/11/2020   Procedure: ESOPHAGOGASTRODUODENOSCOPY (EGD) WITH PROPOFOL;  Surgeon: Toney Reil, MD;  Location: [facility] ENDOSCOPY;  Service: Gastroenterology;   Laterality: N/A;   GIVENS CAPSULE STUDY  10/11/2020   Procedure: GIVENS CAPSULE STUDY;  Surgeon: Toney Reil, MD;  Location: [facility] ENDOSCOPY;  Service: Gastroenterology;;   HIP ARTHROPLASTY Left 09/05/2019   Procedure: ARTHROPLASTY BIPOLAR HIP (HEMIARTHROPLASTY);  Surgeon: Christena Flake, MD;  Location: [facility] ORS;  Service: Orthopedics;  Laterality: Left;   TEE WITHOUT CARDIOVERSION  03/08/2013   Family History  Problem Relation Age of Onset   Breast cancer Neg Hx    Social History   Socioeconomic History   Marital status: Widowed    Spouse name: Not on file   Number of children: Not on file  Years of education: Not on file   Highest education level: Not on file  Occupational History   Not on file  Tobacco Use   Smoking status: Former    Current packs/day: 0.00    Average packs/day: 1 pack/day for 20.0 years (20.0 ttl pk-yrs)    Types: Cigarettes    Start date: 02/21/1969    Quit date: 02/21/1989    Years since quitting: 33.5   Smokeless tobacco: Never  Vaping Use   Vaping status: Never Used  Substance and Sexual Activity   Alcohol use: No   Drug use: No   Sexual activity: Not on file  Other Topics Concern   Not on file  Social History Narrative   Not on file   Social Determinants of Health   Financial Resource Strain: Not on file  Food Insecurity: Not on file  Transportation Needs: Not on file  Physical Activity: Not on file  Stress: Not on file  Social Connections: Not on file    Tobacco Counseling Counseling given: Not Answered   Clinical Intake:  Pre-visit preparation completed: Yes  Pain : No/denies pain     BMI - recorded: 32 Nutritional Risks: None Diabetes: Yes            Activities of Daily Living    09/01/2022   11:22 AM  In your present state of health, do you have any difficulty performing the following activities:  Hearing? 0  Vision? 0  Difficulty concentrating or making decisions? 1  Walking or climbing stairs? 1   Dressing or bathing? 1  Doing errands, shopping? 1  Preparing Food and eating ? Y  Using the Toilet? Y  In the past six months, have you accidently leaked urine? Y  Do you have problems with loss of bowel control? Y  Managing your Medications? Y  Managing your Finances? Y  Housekeeping or managing your Housekeeping? Y    Patient Care Team: Earnestine Mealing, MD as PCP - General (Family Medicine)  Indicate any recent Medical Services you may have received from other than [facility] providers in the past year (date may be approximate).     Assessment:   This is a routine wellness examination for [facility].  Hearing/Vision screen No results found.  Dietary issues and exercise activities discussed:     Goals Addressed   None    Depression Screen     No data to display          Fall Risk    09/01/2022   11:22 AM 07/21/2022    8:41 PM  Fall Risk   Falls in the past year? 0 0  Number falls in past yr:  0  Injury with Fall?  0    MEDICARE RISK AT HOME:   TIMED UP AND GO:  Was the test performed?  No    Cognitive Function:        Immunizations Immunization History  Administered Date(s) Administered   Influenza Inj Mdck Quad Pf 10/29/2015, 11/03/2017, 10/30/2018   Influenza-Unspecified 10/26/2014, 11/10/2016, 11/23/2021   Moderna Covid-19 Vaccine Bivalent Booster 56yrs & up 10/30/2020, 07/06/2021   Moderna SARS-COV2 Booster Vaccination 12/25/2019   Moderna Sars-Covid-2 Vaccination 02/20/2019, 03/20/2019, 03/18/2020, 06/26/2020, 12/17/2021   PFIZER(Purple Top)SARS-COV-2 Vaccination 08/07/2019, 08/30/2019   Pfizer Covid-19 Vaccine Bivalent Booster 1yrs & up 10/30/2020   Pneumococcal Polysaccharide-23 04/24/2020   Zoster Recombinant(Shingrix) 10/20/2017, 12/21/2017    TDAP status: Due, Education has been provided regarding the importance of this vaccine. Advised may receive this vaccine  at local pharmacy or Health Dept. Aware to provide a copy of the vaccination  record if obtained from local pharmacy or Health Dept. Verbalized acceptance and understanding.  Flu Vaccine status: Up to date  Pneumococcal vaccine status: Due, Education has been provided regarding the importance of this vaccine. Advised may receive this vaccine at local pharmacy or Health Dept. Aware to provide a copy of the vaccination record if obtained from local pharmacy or Health Dept. Verbalized acceptance and understanding.  Covid-19 vaccine status: Information provided on how to obtain vaccines.   Qualifies for Shingles Vaccine? Yes   Zostavax completed No   Shingrix Completed?: Yes  Screening Tests Health Maintenance  Topic Date Due   OPHTHALMOLOGY EXAM  Never done   DTaP/Tdap/Td (1 - Tdap) Never done   Pneumonia Vaccine 75+ Years old (2 of 2 - PCV) 04/24/2021   COVID-19 Vaccine (10 - 2023-24 season) 02/11/2022   INFLUENZA VACCINE  09/08/2022   HEMOGLOBIN A1C  12/17/2022   FOOT EXAM  04/19/2023   Medicare Annual Wellness (AWV)  09/01/2023   Zoster Vaccines- Shingrix  Completed   HPV VACCINES  Aged Out   DEXA SCAN  Discontinued    Health Maintenance  Health Maintenance Due  Topic Date Due   OPHTHALMOLOGY EXAM  Never done   DTaP/Tdap/Td (1 - Tdap) Never done   Pneumonia Vaccine 82+ Years old (2 of 2 - PCV) 04/24/2021   COVID-19 Vaccine (10 - 2023-24 season) 02/11/2022    Colorectal cancer screening: No longer required.   Mammogram status: No longer required due to age.  NA to bone density due to immobility  Lung Cancer Screening: (Low Dose CT Chest recommended if Age 34-80 years, 20 pack-year currently smoking OR have quit w/in 15years.) does not qualify.   Lung Cancer Screening Referral: na  Additional Screening:  Hepatitis C Screening: does not qualify  Vision Screening: Recommended annual ophthalmology exams for early detection of glaucoma and other disorders of the eye. Is the patient up to date with their annual eye exam?  No - schedule for  September  Who is the provider or what is the name of the office in which the patient attends annual eye exams? Onsite eye If pt is not established with a provider, would they like to be referred to a provider to establish care? No .   Dental Screening: Recommended annual dental exams for proper oral hygiene  Diabetic Foot Exam: Diabetic Foot Exam: Completed 04/19/22  Community Resource Referral / Chronic Care Management: CRR required this visit?  No   CCM required this visit?  No     Plan:     I have personally reviewed and noted the following in the patient's chart:   Medical and social history Use of alcohol, tobacco or illicit drugs  Current medications and supplements including opioid prescriptions. Patient is currently taking opioid prescriptions. Information provided to patient regarding non-opioid alternatives. Patient advised to discuss non-opioid treatment plan with their provider. Functional ability and status Nutritional status Physical activity Advanced directives List of other physicians Hospitalizations, surgeries, and ER visits in previous 12 months Vitals Screenings to include cognitive, depression, and falls Referrals and appointments  In addition, I have reviewed and discussed with patient certain preventive protocols, quality metrics, and best practice recommendations. A written personalized care plan for preventive services as well as general preventive health recommendations were provided to patient.     Sharon Seller, NP   09/01/2022

## 2022-09-01 NOTE — Patient Instructions (Signed)
  Ms. Krystal Goodwin , Thank you for taking time to come for your Medicare Wellness Visit. I appreciate your ongoing commitment to your health goals. Please review the following plan we discussed and let me know if I can assist you in the future.   These are the goals we discussed:  Goals   None     This is a list of the screening recommended for you and due dates:  Health Maintenance  Topic Date Due   Eye exam for diabetics  Never done   DTaP/Tdap/Td vaccine (1 - Tdap) Never done   Pneumonia Vaccine (2 of 2 - PCV) 04/24/2021   COVID-19 Vaccine (10 - 2023-24 season) 02/11/2022   Flu Shot  09/08/2022   Hemoglobin A1C  12/17/2022   Complete foot exam   04/19/2023   Medicare Annual Wellness Visit  09/01/2023   Zoster (Shingles) Vaccine  Completed   HPV Vaccine  Aged Out   DEXA scan (bone density measurement)  Discontinued

## 2022-09-12 ENCOUNTER — Non-Acute Institutional Stay (SKILLED_NURSING_FACILITY): Payer: Medicare PPO | Admitting: Adult Health

## 2022-09-12 DIAGNOSIS — R1312 Dysphagia, oropharyngeal phase: Secondary | ICD-10-CM | POA: Diagnosis not present

## 2022-09-12 DIAGNOSIS — F0150 Vascular dementia without behavioral disturbance: Secondary | ICD-10-CM

## 2022-09-12 NOTE — Progress Notes (Signed)
Location:  Other Nursing Home Room Number: 410A Place of Service:  SNF (31) Provider:  Medina-Var Viviann Spare, DNP, FNP-BC  Patient Care Team: Earnestine Mealing, MD as PCP - General (Family Medicine)  Extended Emergency Contact Information Primary Emergency Contact: Okey Dupre Address: [address]          [place], [place] [address] D[place] of [place] Home Phone: [phone] Work Phone: [phone] Mobile Phone: [phone] Relation: Daughter Secondary Emergency Contact: Donata Duff Mobile Phone: [phone] Relation: Son  Code Status:   DNR  Goals of care: Advanced Directive information    09/01/2022   10:18 AM  Advanced Directives  Does Patient Have a Medical Advance Directive? Yes  Type of Advance Directive Out of facility DNR (pink MOST or yellow form)  Does patient want to make changes to medical advance directive? No - Patient declined     Chief Complaint  Patient presents with   Acute Visit    Swallowing problem    HPI:  Pt is a 89 y.o. female seen today for an acute visit regarding swallowing problem. She is a long-term care resident of [address] She has a PMH of metabolic syndrome, history of colon cancer s/p partial colectomy, atrial fibrillation on Eliquis, severe aortic stenosis s/p aortic valve replacement and dementia. She is currently seen by speech therapist regarding swallowing problems. Speech therapist recommending modified barium swallow study at [facility]. She was seen in the room today with daughter at bedside. Daughter declined swallow study and requested for palliative care consult. Daughter does not want tube feedings.    Past Medical History:  Diagnosis Date   Anemia    iron def.anemia   Aortic stenosis, severe    Asthma    Chest pain    Colon cancer (HCC)    Complication of anesthesia    nausea   Diabetes mellitus without complication (HCC)    Dyslipidemia    Dyspnea    Gout, joint     H/O postmenopausal osteoporosis    Hematuria    History of palpitations    Hx of colonic polyps    Hypertension    Hypothyroidism    IBS (irritable bowel syndrome)    Schatzki's ring    Sleep apnea    Past Surgical History:  Procedure Laterality Date   AORTIC VALVE REPLACEMENT     CARDIAC PACEMAKER PLACEMENT     CHOLECYSTECTOMY     COLON SURGERY  1997   RIGHT HEMICOLECTOMY   COLONOSCOPY     COLONOSCOPY WITH PROPOFOL N/A 09/07/2016   Procedure: COLONOSCOPY WITH PROPOFOL;  Surgeon: Scot Jun, MD;  Location: [facility] ENDOSCOPY;  Service: Endoscopy;  Laterality: N/A;   COLONOSCOPY WITH PROPOFOL N/A 10/11/2020   Procedure: COLONOSCOPY WITH PROPOFOL;  Surgeon: Toney Reil, MD;  Location: [facility] ENDOSCOPY;  Service: Gastroenterology;  Laterality: N/A;   ESOPHAGOGASTRODUODENOSCOPY     ESOPHAGOGASTRODUODENOSCOPY (EGD) WITH PROPOFOL N/A 10/11/2020   Procedure: ESOPHAGOGASTRODUODENOSCOPY (EGD) WITH PROPOFOL;  Surgeon: Toney Reil, MD;  Location: [facility] ENDOSCOPY;  Service: Gastroenterology;  Laterality: N/A;   GIVENS CAPSULE STUDY  10/11/2020   Procedure: GIVENS CAPSULE STUDY;  Surgeon: Toney Reil, MD;  Location:  [facility] ENDOSCOPY;  Service: Gastroenterology;;   HIP ARTHROPLASTY Left 09/05/2019   Procedure: ARTHROPLASTY BIPOLAR HIP (HEMIARTHROPLASTY);  Surgeon: Christena Flake, MD;  Location: [facility] ORS;  Service: Orthopedics;  Laterality: Left;   TEE WITHOUT CARDIOVERSION  03/08/2013    Allergies  Allergen Reactions   Amoxicillin Itching  and Rash    Breaks out in a rash On hands On hands    Erythromycin Nausea And Vomiting and Other (See Comments)   Clindamycin/Lincomycin    Tramadol     Outpatient Encounter Medications as of 09/12/2022  Medication Sig   acetaminophen (TYLENOL) 500 MG tablet Take 500 mg by mouth 3 (three) times daily.   albuterol (VENTOLIN HFA) 108 (90 Base) MCG/ACT inhaler Inhale 2 puffs into the lungs every 6 (six) hours as needed for wheezing or  shortness of breath.   atorvastatin (LIPITOR) 10 MG tablet Take 10 mg by mouth daily.   bisacodyl (DULCOLAX) 10 MG suppository Place 10 mg rectally daily as needed for moderate constipation.   bismuth subsalicylate (PEPTO BISMOL) 262 MG/15ML suspension Take 30 mLs by mouth every 4 (four) hours as needed.   cetirizine (ZYRTEC) 10 MG tablet Take 10 mg by mouth daily.   Cholecalciferol (VITAMIN D3) 50 MCG (2000 UT) TABS Take 2,000 Units by mouth daily.   citalopram (CELEXA) 10 MG tablet Take 10 mg by mouth daily. Time of day not specified per [facility] EMR system [facility] [employer] [facility] (see other listings)   diclofenac Sodium (VOLTAREN) 1 % GEL 4 g 4 (four) times daily. To left knee   ELIQUIS 5 MG TABS tablet Take 5 mg by mouth 2 (two) times daily.   famotidine (PEPCID) 10 MG tablet Take 10 mg by mouth at bedtime.   ferrous sulfate 325 (65 FE) MG tablet Take 1 tablet (325 mg total) by mouth daily with breakfast.   furosemide (LASIX) 20 MG tablet Take 20 mg by mouth daily.   HYDROcodone-acetaminophen (NORCO) 5-325 MG tablet Take 1 tablet by mouth in the morning and at bedtime.   HYDROcodone-acetaminophen (NORCO) 7.5-325 MG tablet Take 1 tablet by mouth. Every 24 hours as needed.   levothyroxine (SYNTHROID) 100 MCG tablet Take 100 mcg by mouth at bedtime.   Lidocaine-Collagen-Aloe Vera 2 % GEL Apply 1 Application topically every 2 (two) hours as needed (to lower lip for lip pain).   melatonin 5 MG TABS Take 5 mg by mouth at bedtime.   metoprolol succinate (TOPROL-XL) 25 MG 24 hr tablet Take 25 mg by mouth 2 (two) times daily.   Multiple Vitamin (MULTIVITAMIN) tablet Take 1 tablet by mouth daily.   nystatin (MYCOSTATIN/NYSTOP) powder Apply 1 Application topically every 12 (twelve) hours as needed (to groin area for skin breakdown).   ondansetron (ZOFRAN) 4 MG tablet Take 4 mg by mouth every 6 (six) hours as needed for nausea or vomiting.   polyethylene glycol powder (GLYCOLAX/MIRALAX) 17 GM/SCOOP powder  Take 1 Container by mouth every other day.   potassium chloride (KLOR-CON M) 10 MEQ tablet Take 10 mEq by mouth daily.   senna-docusate (SENOKOT-S) 8.6-50 MG tablet Take 2 tablets by mouth at bedtime.   triamcinolone cream (KENALOG) 0.1 % Apply 1 Application topically every 8 (eight) hours as needed (for itchy skin and legs).   No facility-administered encounter medications on file as of 09/12/2022.    Review of Systems   Unable to obtain due to dementia.    Immunization History  Administered Date(s) Administered   Influenza Inj Mdck Quad Pf 10/29/2015, 11/03/2017, 10/30/2018   Influenza-Unspecified 10/26/2014, 11/10/2016, 11/23/2021   Moderna Covid-19 Vaccine Bivalent Booster 51yrs & up 10/30/2020, 07/06/2021   Moderna SARS-COV2 Booster Vaccination 12/25/2019   Moderna Sars-Covid-2 Vaccination 02/20/2019, 03/20/2019, 03/18/2020, 06/26/2020, 12/17/2021   PFIZER(Purple Top)SARS-COV-2 Vaccination 08/07/2019, 08/30/2019   Pfizer Covid-19 Vaccine Bivalent Booster 87yrs &  up 10/30/2020   Pneumococcal Polysaccharide-23 04/24/2020   Zoster Recombinant(Shingrix) 10/20/2017, 12/21/2017   Pertinent  Health Maintenance Due  Topic Date Due   OPHTHALMOLOGY EXAM  Never done   INFLUENZA VACCINE  09/08/2022   HEMOGLOBIN A1C  12/17/2022   FOOT EXAM  04/19/2023   DEXA SCAN  Discontinued      10/12/2020   10:54 AM 10/12/2020    8:00 PM 10/13/2020    8:22 AM 07/21/2022    8:41 PM 09/01/2022   11:22 AM  Fall Risk  Falls in the past year?    0 0  Was there an injury with Fall?    0   Fall Risk Category Calculator    0   (RETIRED) Patient Fall Risk Level High fall risk High fall risk High fall risk       Vitals:   09/12/22 1709  BP: 96/61  Pulse: 69  Resp: 16  Temp: (!) 97.2 F (36.2 C)  Weight: 190 lb 9.6 oz (86.5 kg)   Body mass index is 31.72 kg/m.  Physical Exam Constitutional:      Appearance: Normal appearance.  HENT:     Head: Normocephalic and atraumatic.     Nose: Nose normal.      Mouth/Throat:     Mouth: Mucous membranes are moist.  Eyes:     Conjunctiva/sclera: Conjunctivae normal.  Cardiovascular:     Rate and Rhythm: Normal rate and regular rhythm.  Pulmonary:     Effort: Pulmonary effort is normal.     Breath sounds: Normal breath sounds.  Abdominal:     General: Bowel sounds are normal.     Palpations: Abdomen is soft.  Musculoskeletal:     Cervical back: Normal range of motion.  Skin:    General: Skin is warm and dry.  Neurological:     Mental Status: She is alert. Mental status is at baseline.     Comments: Slurred speech  Psychiatric:        Mood and Affect: Mood normal.        Behavior: Behavior normal.        Labs reviewed: Recent Labs    12/09/21 0000 04/21/22 0000 06/16/22 0000  NA 141 138 139  K 4.8 4.4 4.6  CL 108 106 105  CO2 26* 25* 26*  BUN 24* 21 28*  CREATININE 1.2* 1.2* 1.3*  CALCIUM 9.4 9.2 9.7   Recent Labs    11/18/21 0000 12/09/21 0000 04/21/22 0000 06/16/22 0000  AST 15 15 17   --   ALT 8 9 9   --   ALKPHOS 81  --  73  --   ALBUMIN 3.6 3.4* 3.4* 3.8   Recent Labs    12/09/21 0000 04/21/22 0000 06/08/22 0000  WBC 5.8 5.4 5.8  NEUTROABS 2,778.00 2,630.00 3,045.00  HGB 10.8* 10.5* 10.0*  HCT 33* 33* 31*  PLT 183 165 198   Lab Results  Component Value Date   TSH 0.83 11/18/2021   Lab Results  Component Value Date   HGBA1C 5.8 06/16/2022   Lab Results  Component Value Date   CHOL 131 12/09/2021   HDL 40 12/09/2021   LDLCALC 64 12/09/2021   TRIG 206 (A) 12/09/2021    Significant Diagnostic Results in last 30 days:  No results found.  Assessment/Plan  1. Oropharyngeal dysphagia -  daughter declined swallow study -  aspiration precautions  2. Vascular dementia without behavioral disturbance (HCC) -  continue supportive care -  palliative care  consult     Family/ staff Communication: Discussed plan of care with daughter and charge nurse  Labs/tests ordered: None    Kenard Gower, DNP, MSN, FNP-BC [facility] Adult [facility] [phone] (Monday-Friday 8:00 a.m. - 5:00 p.m.) [phone] (after hours)

## 2022-09-13 ENCOUNTER — Non-Acute Institutional Stay (SKILLED_NURSING_FACILITY): Payer: Medicare PPO | Admitting: Nurse Practitioner

## 2022-09-13 ENCOUNTER — Encounter: Payer: Self-pay | Admitting: Nurse Practitioner

## 2022-09-13 DIAGNOSIS — F0150 Vascular dementia without behavioral disturbance: Secondary | ICD-10-CM | POA: Diagnosis not present

## 2022-09-13 DIAGNOSIS — N390 Urinary tract infection, site not specified: Secondary | ICD-10-CM | POA: Diagnosis not present

## 2022-09-13 DIAGNOSIS — R4182 Altered mental status, unspecified: Secondary | ICD-10-CM | POA: Diagnosis not present

## 2022-09-13 DIAGNOSIS — R5383 Other fatigue: Secondary | ICD-10-CM

## 2022-09-13 LAB — BASIC METABOLIC PANEL
BUN: 24 — AB (ref 4–21)
CO2: 23 — AB (ref 13–22)
Chloride: 106 (ref 99–108)
Creatinine: 1.3 — AB (ref 0.5–1.1)
Glucose: 104
Potassium: 4.2 mEq/L (ref 3.5–5.1)
Sodium: 139 (ref 137–147)

## 2022-09-13 LAB — COMPREHENSIVE METABOLIC PANEL
Calcium: 9.5 (ref 8.7–10.7)
eGFR: 39

## 2022-09-13 LAB — CBC AND DIFFERENTIAL
HCT: 29 — AB (ref 36–46)
Hemoglobin: 9.1 — AB (ref 12.0–16.0)
Neutrophils Absolute: 3234
Platelets: 166 10*3/uL (ref 150–400)
WBC: 5.5

## 2022-09-13 LAB — CBC: RBC: 3.09 — AB (ref 3.87–5.11)

## 2022-09-13 NOTE — Progress Notes (Signed)
Location:  Other [facility] [place].  Nursing Home Room Number: [facility] [address]A Place of Service:  SNF [phone]) Abbey Chatters, NP  PCP: Earnestine Mealing, MD  Patient Care Team: Earnestine Mealing, MD as PCP - General [facility] Medicine)  Extended Emergency Contact Information Primary Emergency Contact: Okey Dupre Address: [address]          [place], [place] [address] [place] of [place] Home Phone: [phone] Work Phone: [phone] Mobile Phone: [phone] Relation: Daughter Secondary Emergency Contact: Donata Duff Mobile Phone: [phone] Relation: Son  Goals of care: Advanced Directive information    09/13/2022    3:21 PM  Advanced Directives  Does Patient Have a Medical Advance Directive? Yes  Type of Advance Directive Out of facility DNR (pink MOST or yellow form)  Does patient want to make changes to medical advance directive? No - Patient declined     Chief Complaint  Patient presents with   Acute Visit    Tremor and Fatigue.     HPI:  Pt is a 89 y.o. female seen today for an acute visit for increase in fatigue and slurred speech.  Daughter at bedside during visit and reports Ms dvorak speech has progressively worsened over the last 3-4 weeks. Today she is more fatigued and worsening tremor. She required staff to feed her during breakfast and has not eaten or drank much fluids today She has a slight droop of her left side of face.  Noted she was having some coughing when eating but daughter did not want swallow evaluation.  No fevers or chills.    Past Medical History:  Diagnosis Date   Anemia    iron def.anemia   Aortic stenosis, severe    Asthma    Chest pain    Colon cancer (HCC)    Complication of anesthesia    nausea   Diabetes mellitus without complication (HCC)    Dyslipidemia    Dyspnea    Gout, joint    H/O postmenopausal osteoporosis    Hematuria    History of palpitations    Hx of colonic polyps    Hypertension     Hypothyroidism    IBS (irritable bowel syndrome)    Schatzki's ring    Sleep apnea    Past Surgical History:  Procedure Laterality Date   AORTIC VALVE REPLACEMENT     CARDIAC PACEMAKER PLACEMENT     CHOLECYSTECTOMY     COLON SURGERY  1997   RIGHT HEMICOLECTOMY   COLONOSCOPY     COLONOSCOPY WITH PROPOFOL N/A 09/07/2016   Procedure: COLONOSCOPY WITH PROPOFOL;  Surgeon: Scot Jun, MD;  Location: [facility] ENDOSCOPY;  Service: Endoscopy;  Laterality: N/A;   COLONOSCOPY WITH PROPOFOL N/A 10/11/2020   Procedure: COLONOSCOPY WITH PROPOFOL;  Surgeon: Toney Reil, MD;  Location: [facility] ENDOSCOPY;  Service: Gastroenterology;  Laterality: N/A;   ESOPHAGOGASTRODUODENOSCOPY     ESOPHAGOGASTRODUODENOSCOPY (EGD) WITH PROPOFOL N/A 10/11/2020   Procedure: ESOPHAGOGASTRODUODENOSCOPY (EGD) WITH PROPOFOL;  Surgeon: Toney Reil, MD;  Location: [facility] ENDOSCOPY;  Service: Gastroenterology;  Laterality: N/A;   GIVENS CAPSULE STUDY  10/11/2020   Procedure: GIVENS CAPSULE STUDY;  Surgeon: Toney Reil, MD;  Location: [facility] ENDOSCOPY;  Service: Gastroenterology;;   HIP ARTHROPLASTY Left 09/05/2019   Procedure: ARTHROPLASTY BIPOLAR HIP (HEMIARTHROPLASTY);  Surgeon: Christena Flake, MD;  Location: [facility] ORS;  Service: Orthopedics;  Laterality: Left;   TEE WITHOUT CARDIOVERSION  03/08/2013    Allergies  Allergen Reactions   Amoxicillin Itching and Rash  Breaks out in a rash On hands On hands    Erythromycin Nausea And Vomiting and Other (See Comments)   Clindamycin/Lincomycin    Tramadol     Outpatient Encounter Medications as of 09/13/2022  Medication Sig   acetaminophen (TYLENOL) 500 MG tablet Take 500 mg by mouth 3 (three) times daily.   albuterol (VENTOLIN HFA) 108 (90 Base) MCG/ACT inhaler Inhale 2 puffs into the lungs every 6 (six) hours as needed for wheezing or shortness of breath.   atorvastatin (LIPITOR) 10 MG tablet Take 10 mg by mouth daily.   bisacodyl (DULCOLAX) 10 MG  suppository Place 10 mg rectally daily as needed for moderate constipation.   bismuth subsalicylate (PEPTO BISMOL) 262 MG/15ML suspension Take 30 mLs by mouth every 4 (four) hours as needed.   cetirizine (ZYRTEC) 10 MG tablet Take 10 mg by mouth daily.   Cholecalciferol (VITAMIN D3) 50 MCG (2000 UT) TABS Take 2,000 Units by mouth daily.   citalopram (CELEXA) 10 MG tablet Take 10 mg by mouth daily. Time of day not specified per [facility] EMR system Point Click [facility] (see other listings)   diclofenac Sodium (VOLTAREN) 1 % GEL 4 g 4 (four) times daily. To left knee   ELIQUIS 5 MG TABS tablet Take 5 mg by mouth 2 (two) times daily.   famotidine (PEPCID) 10 MG tablet Take 10 mg by mouth at bedtime.   ferrous sulfate 325 (65 FE) MG tablet Take 1 tablet (325 mg total) by mouth daily with breakfast.   furosemide (LASIX) 20 MG tablet Take 20 mg by mouth daily.   HYDROcodone-acetaminophen (NORCO) 5-325 MG tablet Take 1 tablet by mouth in the morning and at bedtime.   HYDROcodone-acetaminophen (NORCO) 7.5-325 MG tablet Take 1 tablet by mouth. Every 24 hours as needed.   levothyroxine (SYNTHROID) 100 MCG tablet Take 100 mcg by mouth at bedtime.   Lidocaine-Collagen-Aloe Vera 2 % GEL Apply 1 Application topically every 2 (two) hours as needed (to lower lip for lip pain).   melatonin 5 MG TABS Take 5 mg by mouth at bedtime.   metoprolol succinate (TOPROL-XL) 25 MG 24 hr tablet Take 25 mg by mouth 2 (two) times daily.   Multiple Vitamin (MULTIVITAMIN) tablet Take 1 tablet by mouth daily.   nystatin (MYCOSTATIN/NYSTOP) powder Apply 1 Application topically every 12 (twelve) hours as needed (to groin area for skin breakdown).   ondansetron (ZOFRAN) 4 MG tablet Take 4 mg by mouth every 6 (six) hours as needed for nausea or vomiting.   polyethylene glycol powder (GLYCOLAX/MIRALAX) 17 GM/SCOOP powder Take 1 Container by mouth every other day.   potassium chloride (KLOR-CON M) 10 MEQ tablet Take 10 mEq by mouth daily.    senna-docusate (SENOKOT-S) 8.6-50 MG tablet Take 2 tablets by mouth at bedtime.   triamcinolone cream (KENALOG) 0.1 % Apply 1 Application topically every 8 (eight) hours as needed (for itchy skin and legs).   No facility-administered encounter medications on file as of 09/13/2022.    Review of Systems  Unable to perform ROS: Dementia    Immunization History  Administered Date(s) Administered   Influenza Inj Mdck Quad Pf 10/29/2015, 11/03/2017, 10/30/2018   Influenza-Unspecified 10/26/2014, 11/10/2016, 11/23/2021   Moderna Covid-19 Vaccine Bivalent Booster 63yrs & up 10/30/2020, 07/06/2021   Moderna SARS-COV2 Booster Vaccination 12/25/2019   Moderna Sars-Covid-2 Vaccination 02/20/2019, 03/20/2019, 03/18/2020, 06/26/2020, 12/17/2021   PFIZER(Purple Top)SARS-COV-2 Vaccination 08/07/2019, 08/30/2019   PNEUMOCOCCAL CONJUGATE-20 09/01/2022   Pfizer Covid-19 Vaccine Bivalent Booster 74yrs & up  10/30/2020   Pneumococcal Polysaccharide-23 04/24/2020   Zoster Recombinant(Shingrix) 10/20/2017, 12/21/2017   Pertinent  Health Maintenance Due  Topic Date Due   OPHTHALMOLOGY EXAM  Never done   INFLUENZA VACCINE  09/08/2022   HEMOGLOBIN A1C  12/17/2022   FOOT EXAM  04/19/2023   DEXA SCAN  Discontinued      10/12/2020   10:54 AM 10/12/2020    8:00 PM 10/13/2020    8:22 AM 07/21/2022    8:41 PM 09/01/2022   11:22 AM  Fall Risk  Falls in the past year?    0 0  Was there an injury with Fall?    0   Fall Risk Category Calculator    0   (RETIRED) Patient Fall Risk Level High fall risk High fall risk High fall risk     Functional Status Survey:    Vitals:   09/13/22 1514  BP: 96/61  Pulse: 69  Resp: 16  Temp: (!) 97.2 F (36.2 C)  SpO2: 96%  Weight: 190 lb 9.6 oz (86.5 kg)  Height: 5\' 5"  (1.651 m)   Body mass index is 31.72 kg/m. Physical Exam Constitutional:      General: She is not in acute distress.    Appearance: She is well-developed. She is not diaphoretic.  HENT:     Head:  Normocephalic and atraumatic.     Mouth/Throat:     Pharynx: No oropharyngeal exudate.  Eyes:     Conjunctiva/sclera: Conjunctivae normal.     Pupils: Pupils are equal, round, and reactive to light.  Cardiovascular:     Rate and Rhythm: Normal rate and regular rhythm.     Heart sounds: Normal heart sounds.  Pulmonary:     Effort: Pulmonary effort is normal.     Breath sounds: Normal breath sounds.  Abdominal:     General: Bowel sounds are normal.     Palpations: Abdomen is soft.  Musculoskeletal:     Cervical back: Normal range of motion and neck supple.     Right lower leg: No edema.     Left lower leg: No edema.  Skin:    General: Skin is warm and dry.  Neurological:     Mental Status: She is lethargic and disoriented.     Motor: Weakness present.  Psychiatric:        Cognition and Memory: Cognition is impaired. Memory is impaired. She exhibits impaired recent memory and impaired remote memory.    Labs reviewed: Recent Labs    12/09/21 0000 04/21/22 0000 06/16/22 0000  NA 141 138 139  K 4.8 4.4 4.6  CL 108 106 105  CO2 26* 25* 26*  BUN 24* 21 28*  CREATININE 1.2* 1.2* 1.3*  CALCIUM 9.4 9.2 9.7   Recent Labs    11/18/21 0000 12/09/21 0000 04/21/22 0000 06/16/22 0000  AST 15 15 17   --   ALT 8 9 9   --   ALKPHOS 81  --  73  --   ALBUMIN 3.6 3.4* 3.4* 3.8   Recent Labs    12/09/21 0000 04/21/22 0000 06/08/22 0000  WBC 5.8 5.4 5.8  NEUTROABS 2,778.00 2,630.00 3,045.00  HGB 10.8* 10.5* 10.0*  HCT 33* 33* 31*  PLT 183 165 198   Lab Results  Component Value Date   TSH 0.83 11/18/2021   Lab Results  Component Value Date   HGBA1C 5.8 06/16/2022   Lab Results  Component Value Date   CHOL 131 12/09/2021   HDL 40 12/09/2021  LDLCALC 64 12/09/2021   TRIG 206 (A) 12/09/2021    Significant Diagnostic Results in last 30 days:  No results found.  Assessment/Plan 1. Vascular dementia without behavioral disturbance (HCC) Progressive decline but acute  changes in the last 2 days.  Daughter does not want her hospitalized due to progression of dementia.  She request palliative care referral for more of a comfort approach.   2. Other fatigue -increase fatigue with tremor and slurred speech.  -appears pale as well Will get CBC with diff, bmp  -UA C&S to rule out infection, anemia, or electrolyte distrubance      K. Biagio [facility] [phone]

## 2022-09-19 DIAGNOSIS — Z515 Encounter for palliative care: Secondary | ICD-10-CM | POA: Diagnosis not present

## 2022-09-22 ENCOUNTER — Other Ambulatory Visit: Payer: Self-pay | Admitting: Nurse Practitioner

## 2022-09-22 DIAGNOSIS — M179 Osteoarthritis of knee, unspecified: Secondary | ICD-10-CM

## 2022-09-22 MED ORDER — HYDROCODONE-ACETAMINOPHEN 5-325 MG PO TABS
1.0000 | ORAL_TABLET | Freq: Two times a day (BID) | ORAL | 0 refills | Status: DC
Start: 2022-09-22 — End: 2022-10-25

## 2022-10-25 ENCOUNTER — Other Ambulatory Visit: Payer: Self-pay | Admitting: Nurse Practitioner

## 2022-10-25 DIAGNOSIS — M179 Osteoarthritis of knee, unspecified: Secondary | ICD-10-CM

## 2022-10-25 NOTE — Telephone Encounter (Signed)
Pharmacy requested refill.  Epic LR: 09/22/2022 No Contract on File.   Pended Rx and sent to Dr. Sydnee Cabal for approval.

## 2022-10-27 ENCOUNTER — Encounter: Payer: Self-pay | Admitting: Nurse Practitioner

## 2022-10-27 ENCOUNTER — Non-Acute Institutional Stay (SKILLED_NURSING_FACILITY): Payer: Medicare PPO | Admitting: Nurse Practitioner

## 2022-10-27 DIAGNOSIS — I5032 Chronic diastolic (congestive) heart failure: Secondary | ICD-10-CM | POA: Diagnosis not present

## 2022-10-27 DIAGNOSIS — R1312 Dysphagia, oropharyngeal phase: Secondary | ICD-10-CM | POA: Diagnosis not present

## 2022-10-27 DIAGNOSIS — R54 Age-related physical debility: Secondary | ICD-10-CM | POA: Diagnosis not present

## 2022-10-27 DIAGNOSIS — F0150 Vascular dementia without behavioral disturbance: Secondary | ICD-10-CM | POA: Diagnosis not present

## 2022-10-27 DIAGNOSIS — I480 Paroxysmal atrial fibrillation: Secondary | ICD-10-CM

## 2022-10-27 DIAGNOSIS — Z515 Encounter for palliative care: Secondary | ICD-10-CM | POA: Diagnosis not present

## 2022-10-27 DIAGNOSIS — E119 Type 2 diabetes mellitus without complications: Secondary | ICD-10-CM | POA: Diagnosis not present

## 2022-10-27 DIAGNOSIS — N1832 Chronic kidney disease, stage 3b: Secondary | ICD-10-CM | POA: Diagnosis not present

## 2022-10-27 DIAGNOSIS — E039 Hypothyroidism, unspecified: Secondary | ICD-10-CM | POA: Diagnosis not present

## 2022-10-27 DIAGNOSIS — M170 Bilateral primary osteoarthritis of knee: Secondary | ICD-10-CM | POA: Diagnosis not present

## 2022-10-27 DIAGNOSIS — F325 Major depressive disorder, single episode, in full remission: Secondary | ICD-10-CM | POA: Diagnosis not present

## 2022-10-27 DIAGNOSIS — I10 Essential (primary) hypertension: Secondary | ICD-10-CM | POA: Diagnosis not present

## 2022-10-27 DIAGNOSIS — M179 Osteoarthritis of knee, unspecified: Secondary | ICD-10-CM

## 2022-10-27 DIAGNOSIS — R634 Abnormal weight loss: Secondary | ICD-10-CM | POA: Diagnosis not present

## 2022-10-27 NOTE — Progress Notes (Signed)
Location:  [facility] [place].  Nursing Home Room Number: [facility] [address] Place of Service:  SNF [phone]) Krystal Goodwin  PCP: Earnestine Mealing, MD  Patient Care Team: Earnestine Mealing, MD as PCP - General [facility] Medicine)  Extended Emergency Contact Information Primary Emergency Contact: Okey Dupre Address: [address]          [place], [place] [address] [place] Home Phone: [phone] Work Phone: [phone] Mobile Phone: [phone] Relation: Daughter Secondary Emergency Contact: Donata Duff Mobile Phone: [phone] Relation: Son  Goals of care: Advanced Directive information    10/27/2022   10:19 AM  Advanced Directives  Does Patient Have a Medical Advance Directive? Yes  Type of Advance Directive Out of facility DNR (pink MOST or yellow form)  Does patient want to make changes to medical advance directive? No - Patient declined     Chief Complaint  Patient presents with   Medical Management of Chronic Issues    Medical Management of Chronic Issues.     HPI:  Pt is a 89 y.o. female seen today for medical management of chronic disease.  Pt with hx of dementia, dysphagia, a fib, depression, DM, hypothyroid, CKD, and others.   Pt has had gradual decline due to progressive dementia but recently has been stable.  Nursing has had no acute concerns are complaints.  No complaints or concerns of pain.  She has had gradual weight loss over the last few months but she eats at meals.  Followed by onsite dentist and audiology over the last few months.     Past Medical History:  Diagnosis Date   Anemia    iron def.anemia   Aortic stenosis, severe    Asthma    Chest pain    Colon cancer (HCC)    Complication of anesthesia    nausea   Diabetes mellitus without complication (HCC)    Dyslipidemia    Dyspnea    Gout, joint    H/O postmenopausal osteoporosis    Hematuria    History of palpitations    Hx of colonic polyps    Hypertension     Hypothyroidism    IBS (irritable bowel syndrome)    Schatzki's ring    Sleep apnea    Past Surgical History:  Procedure Laterality Date   AORTIC VALVE REPLACEMENT     CARDIAC PACEMAKER PLACEMENT     CHOLECYSTECTOMY     COLON SURGERY  1997   RIGHT HEMICOLECTOMY   COLONOSCOPY     COLONOSCOPY WITH PROPOFOL N/A 09/07/2016   Procedure: COLONOSCOPY WITH PROPOFOL;  Surgeon: Scot Jun, MD;  Location: [facility] ENDOSCOPY;  Service: Endoscopy;  Laterality: N/A;   COLONOSCOPY WITH PROPOFOL N/A 10/11/2020   Procedure: COLONOSCOPY WITH PROPOFOL;  Surgeon: Toney Reil, MD;  Location: [facility] ENDOSCOPY;  Service: Gastroenterology;  Laterality: N/A;   ESOPHAGOGASTRODUODENOSCOPY     ESOPHAGOGASTRODUODENOSCOPY (EGD) WITH PROPOFOL N/A 10/11/2020   Procedure: ESOPHAGOGASTRODUODENOSCOPY (EGD) WITH PROPOFOL;  Surgeon: Toney Reil, MD;  Location: [facility] ENDOSCOPY;  Service: Gastroenterology;  Laterality: N/A;   GIVENS CAPSULE STUDY  10/11/2020   Procedure: GIVENS CAPSULE STUDY;  Surgeon: Toney Reil, MD;  Location: [facility] ENDOSCOPY;  Service: Gastroenterology;;   HIP ARTHROPLASTY Left 09/05/2019   Procedure: ARTHROPLASTY BIPOLAR HIP (HEMIARTHROPLASTY);  Surgeon: Christena Flake, MD;  Location: [facility] ORS;  Service: Orthopedics;  Laterality: Left;   TEE WITHOUT CARDIOVERSION  03/08/2013    Allergies  Allergen Reactions   Amoxicillin Itching and Rash    Breaks  out in a rash On hands On hands    Erythromycin Nausea And Vomiting and Other (See Comments)   Clindamycin/Lincomycin    Tramadol     Outpatient Encounter Medications as of 10/27/2022  Medication Sig   acetaminophen (TYLENOL) 500 MG tablet Take 500 mg by mouth 3 (three) times daily.   albuterol (VENTOLIN HFA) 108 (90 Base) MCG/ACT inhaler Inhale 2 puffs into the lungs every 6 (six) hours as needed for wheezing or shortness of breath.   atorvastatin (LIPITOR) 10 MG tablet Take 10 mg by mouth daily.   bisacodyl (DULCOLAX) 10 MG  suppository Place 10 mg rectally daily as needed for moderate constipation.   bismuth subsalicylate (PEPTO BISMOL) 262 MG/15ML suspension Take 30 mLs by mouth every 4 (four) hours as needed.   cetirizine (ZYRTEC) 10 MG tablet Take 10 mg by mouth daily.   Cholecalciferol (VITAMIN D3) 50 MCG (2000 UT) TABS Take 2,000 Units by mouth daily.   citalopram (CELEXA) 10 MG tablet Take 10 mg by mouth daily. Time of day not specified per [facility] EMR system [employer] [facility] (see other listings)   diclofenac Sodium (VOLTAREN) 1 % GEL 4 g 4 (four) times daily. To left knee   ELIQUIS 5 MG TABS tablet Take 5 mg by mouth 2 (two) times daily.   famotidine (PEPCID) 10 MG tablet Take 10 mg by mouth at bedtime.   ferrous sulfate 325 (65 FE) MG tablet Take 1 tablet (325 mg total) by mouth daily with breakfast.   furosemide (LASIX) 20 MG tablet Take 20 mg by mouth daily.   HYDROcodone-acetaminophen (NORCO) 7.5-325 MG tablet Take 1 tablet by mouth. Every 24 hours as needed.   HYDROcodone-acetaminophen (NORCO/VICODIN) 5-325 MG tablet TAKE 1 TABLET BY MOUTH TWICE DAILY   levothyroxine (SYNTHROID) 100 MCG tablet Take 100 mcg by mouth at bedtime.   Lidocaine-Collagen-Aloe Vera 2 % GEL Apply 1 Application topically every 2 (two) hours as needed (to lower lip for lip pain).   melatonin 5 MG TABS Take 5 mg by mouth at bedtime.   metoprolol succinate (TOPROL-XL) 25 MG 24 hr tablet Take 25 mg by mouth 2 (two) times daily.   Multiple Vitamin (MULTIVITAMIN) tablet Take 1 tablet by mouth daily.   nystatin (MYCOSTATIN/NYSTOP) powder Apply 1 Application topically every 12 (twelve) hours as needed (to groin area for skin breakdown).   ondansetron (ZOFRAN) 4 MG tablet Take 4 mg by mouth every 6 (six) hours as needed for nausea or vomiting.   polyethylene glycol powder (GLYCOLAX/MIRALAX) 17 GM/SCOOP powder Take 1 Container by mouth every other day.   potassium chloride (KLOR-CON M) 10 MEQ tablet Take 10 mEq by mouth daily.    senna-docusate (SENOKOT-S) 8.6-50 MG tablet Take 2 tablets by mouth at bedtime.   triamcinolone cream (KENALOG) 0.1 % Apply 1 Application topically every 8 (eight) hours as needed (for itchy skin and legs).   No facility-administered encounter medications on file as of 10/27/2022.    Review of Systems  Unable to perform ROS: Dementia     Immunization History  Administered Date(s) Administered   Influenza Inj Mdck Quad Pf 10/29/2015, 11/03/2017, 10/30/2018   Influenza-Unspecified 10/26/2014, 11/10/2016, 11/23/2021   Moderna Covid-19 Vaccine Bivalent Booster 37yrs & up 10/30/2020, 07/06/2021   Moderna SARS-COV2 Booster Vaccination 12/25/2019   Moderna Sars-Covid-2 Vaccination 02/20/2019, 03/20/2019, 03/18/2020, 06/26/2020, 12/17/2021   PFIZER(Purple Top)SARS-COV-2 Vaccination 08/07/2019, 08/30/2019   PNEUMOCOCCAL CONJUGATE-20 09/01/2022   Pfizer Covid-19 Vaccine Bivalent Booster 26yrs & up 10/30/2020   Pneumococcal  Polysaccharide-23 04/24/2020   Tdap 10/03/2022   Zoster Recombinant(Shingrix) 10/20/2017, 12/21/2017   Pertinent  Health Maintenance Due  Topic Date Due   OPHTHALMOLOGY EXAM  Never done   INFLUENZA VACCINE  09/08/2022   HEMOGLOBIN A1C  12/17/2022   FOOT EXAM  04/19/2023   DEXA SCAN  Discontinued      10/12/2020   10:54 AM 10/12/2020    8:00 PM 10/13/2020    8:22 AM 07/21/2022    8:41 PM 09/01/2022   11:22 AM  Fall Risk  Falls in the past year?    0 0  Was there an injury with Fall?    0   Fall Risk Category Calculator    0   (RETIRED) Patient Fall Risk Level High fall risk High fall risk High fall risk     Functional Status Survey:    Vitals:   10/27/22 0951  BP: (!) 110/54  Pulse: 69  Resp: 16  Temp: (!) 97.5 F (36.4 C)  SpO2: 96%  Weight: 183 lb 3.2 oz (83.1 kg)  Height: 5\' 5"  (1.651 m)   Body mass index is 30.49 kg/m. Physical Exam Constitutional:      General: She is not in acute distress.    Appearance: She is well-developed. She is not  diaphoretic.  HENT:     Head: Normocephalic and atraumatic.     Mouth/Throat:     Pharynx: No oropharyngeal exudate.  Eyes:     Conjunctiva/sclera: Conjunctivae normal.     Pupils: Pupils are equal, round, and reactive to light.  Cardiovascular:     Rate and Rhythm: Normal rate and regular rhythm.     Heart sounds: Normal heart sounds.  Pulmonary:     Effort: Pulmonary effort is normal.     Breath sounds: Normal breath sounds.  Abdominal:     General: Bowel sounds are normal.     Palpations: Abdomen is soft.  Musculoskeletal:     Cervical back: Normal range of motion and neck supple.     Right lower leg: No edema.     Left lower leg: No edema.  Skin:    General: Skin is warm and dry.  Neurological:     Mental Status: She is alert. Mental status is at baseline. She is disoriented.     Motor: Weakness present.     Gait: Gait abnormal.  Psychiatric:        Mood and Affect: Mood normal.     Labs reviewed: Recent Labs    04/21/22 0000 06/16/22 0000 09/13/22 0000  NA 138 139 139  K 4.4 4.6 4.2  CL 106 105 106  CO2 25* 26* 23*  BUN 21 28* 24*  CREATININE 1.2* 1.3* 1.3*  CALCIUM 9.2 9.7 9.5   Recent Labs    11/18/21 0000 12/09/21 0000 04/21/22 0000 06/16/22 0000  AST 15 15 17   --   ALT 8 9 9   --   ALKPHOS 81  --  73  --   ALBUMIN 3.6 3.4* 3.4* 3.8   Recent Labs    04/21/22 0000 06/08/22 0000 09/13/22 0000  WBC 5.4 5.8 5.5  NEUTROABS 2,630.00 3,045.00 3,234.00  HGB 10.5* 10.0* 9.1*  HCT 33* 31* 29*  PLT 165 198 166   Lab Results  Component Value Date   TSH 0.83 11/18/2021   Lab Results  Component Value Date   HGBA1C 5.8 06/16/2022   Lab Results  Component Value Date   CHOL 131 12/09/2021   HDL 40  12/09/2021   LDLCALC 64 12/09/2021   TRIG 206 (A) 12/09/2021    Significant Diagnostic Results in last 30 days:  No results found.  Assessment/Plan 1. Osteoarthritis of knee, unspecified laterality, unspecified osteoarthritis type -stable  continues on hydrocodone 5/325 mg BID, will dc PRN as she is not using  Also has scheduled tylenol 500 mg TID.   2. Vascular dementia without behavioral disturbance (HCC) -Stable, no acute changes in cognitive or functional status, continue supportive care.   3. PAF (paroxysmal atrial fibrillation) (HCC) Rate controlled, continue on metoprolol  Continues on eliquis for anticoagulation  4. Depression, major, in remission (HCC) -stable, maintained on   5. Hypothyroidism, unspecified type TSH at goal on synthroid 100 mcg  6. Stage 3b chronic kidney disease (HCC) -Chronic and stable Encourage proper hydration Follow metabolic panel Avoid nephrotoxic meds (NSAIDS)  7. Benign essential hypertension -Blood pressure well controlled, goal bp <140/90 Continue current medications and dietary modifications follow metabolic panel  8. Diabetes mellitus type II, non insulin dependent (HCC) A1c at goal with dietary modification   9. Chronic diastolic (congestive) heart failure (HCC) Euvolemic, continues on metoprolol and lasix   Blair Lundeen K. Biagio Borg [facility] [phone]

## 2022-11-11 ENCOUNTER — Encounter: Payer: Self-pay | Admitting: Student

## 2022-11-11 ENCOUNTER — Non-Acute Institutional Stay (SKILLED_NURSING_FACILITY): Payer: Self-pay | Admitting: Student

## 2022-11-11 DIAGNOSIS — R300 Dysuria: Secondary | ICD-10-CM | POA: Diagnosis not present

## 2022-11-11 NOTE — Progress Notes (Unsigned)
Location:  [facility] [place].  Nursing Home Room Number: [place]. [address] Place of Service:  SNF (31) Provider:  Earnestine Mealing, MD  Patient Care Team: Earnestine Mealing, MD as PCP - General (Family Medicine)  Extended Emergency Contact Information Primary Emergency Contact: Okey Dupre Address: [address]          [place], [place] [address] [place] Home Phone: [phone] Work Phone: [phone] Mobile Phone: [phone] Relation: Daughter Secondary Emergency Contact: Donata Duff Mobile Phone: [phone] Relation: Son  Code Status:  DNR Goals of care: Advanced Directive information    11/11/2022    4:53 PM  Advanced Directives  Does Patient Have a Medical Advance Directive? Yes  Type of Advance Directive Out of facility DNR (pink MOST or yellow form)  Does patient want to make changes to medical advance directive? No - Patient declined     Chief Complaint  Patient presents with   Acute Visit    Dysuria    HPI:  Pt is a 89 y.o. female seen today for an acute visit for Dysuria.   Patient has had noticeable decline in the last few months.  She is sitting up eating breakfast, however sleeping intermittently between bites.  She says hello but does not answer any additional questions.  Look straight forward.  Per nursing, patient's daughter concern for urinary tract infection.  Patient endorsed dysuria to her yesterday.   Past Medical History:  Diagnosis Date   Anemia    iron def.anemia   Aortic stenosis, severe    Asthma    Chest pain    Colon cancer (HCC)    Complication of anesthesia    nausea   Diabetes mellitus without complication (HCC)    Dyslipidemia    Dyspnea    Gout, joint    H/O postmenopausal osteoporosis    Hematuria    History of palpitations    Hx of colonic polyps    Hypertension    Hypothyroidism    IBS (irritable bowel syndrome)    Schatzki's ring    Sleep apnea    Past Surgical History:  Procedure  Laterality Date   AORTIC VALVE REPLACEMENT     CARDIAC PACEMAKER PLACEMENT     CHOLECYSTECTOMY     COLON SURGERY  1997   RIGHT HEMICOLECTOMY   COLONOSCOPY     COLONOSCOPY WITH PROPOFOL N/A 09/07/2016   Procedure: COLONOSCOPY WITH PROPOFOL;  Surgeon: Scot Jun, MD;  Location: [facility] ENDOSCOPY;  Service: Endoscopy;  Laterality: N/A;   COLONOSCOPY WITH PROPOFOL N/A 10/11/2020   Procedure: COLONOSCOPY WITH PROPOFOL;  Surgeon: Toney Reil, MD;  Location: [facility] ENDOSCOPY;  Service: Gastroenterology;  Laterality: N/A;   ESOPHAGOGASTRODUODENOSCOPY     ESOPHAGOGASTRODUODENOSCOPY (EGD) WITH PROPOFOL N/A 10/11/2020   Procedure: ESOPHAGOGASTRODUODENOSCOPY (EGD) WITH PROPOFOL;  Surgeon: Toney Reil, MD;  Location[facility] ENDOSCOPY;  Service: Gastroenterology;  Laterality: N/A;   GIVENS CAPSULE STUDY  10/11/2020   Procedure: GIVENS CAPSULE STUDY;  Surgeon: Toney Reil, MD;  Location: [facility] ENDOSCOPY;  Service: Gastroenterology;;   HIP ARTHROPLASTY Left 09/05/2019   Procedure: ARTHROPLASTY BIPOLAR HIP (HEMIARTHROPLASTY);  Surgeon: Christena Flake, MD;  Location: [facility] ORS;  Service: Orthopedics;  Laterality: Left;   TEE WITHOUT CARDIOVERSION  03/08/2013    Allergies  Allergen Reactions   Amoxicillin Itching and Rash    Breaks out in a rash On hands On hands    Erythromycin Nausea And Vomiting and Other (See Comments)   Clindamycin/Lincomycin    Tramadol  Outpatient Encounter Medications as of 11/11/2022  Medication Sig   acetaminophen (TYLENOL) 500 MG tablet Take 500 mg by mouth 3 (three) times daily.   albuterol (VENTOLIN HFA) 108 (90 Base) MCG/ACT inhaler Inhale 2 puffs into the lungs every 6 (six) hours as needed for wheezing or shortness of breath.   atorvastatin (LIPITOR) 10 MG tablet Take 10 mg by mouth daily.   bisacodyl (DULCOLAX) 10 MG suppository Place 10 mg rectally daily as needed for moderate constipation.   bismuth subsalicylate (PEPTO BISMOL) 262 MG/15ML  suspension Take 30 mLs by mouth every 4 (four) hours as needed.   cetirizine (ZYRTEC) 10 MG tablet Take 10 mg by mouth daily.   Cholecalciferol (VITAMIN D3) 50 MCG (2000 UT) TABS Take 2,000 Units by mouth daily.   citalopram (CELEXA) 10 MG tablet Take 10 mg by mouth daily. Time of day not specified per [facility] EMR system [employer] [facility] (see other listings)   diclofenac Sodium (VOLTAREN) 1 % GEL 4 g 4 (four) times daily. To left knee   ELIQUIS 5 MG TABS tablet Take 5 mg by mouth 2 (two) times daily.   famotidine (PEPCID) 10 MG tablet Take 10 mg by mouth at bedtime.   ferrous sulfate 325 (65 FE) MG tablet Take 1 tablet (325 mg total) by mouth daily with breakfast.   furosemide (LASIX) 20 MG tablet Take 20 mg by mouth daily.   HYDROcodone-acetaminophen (NORCO/VICODIN) 5-325 MG tablet TAKE 1 TABLET BY MOUTH TWICE DAILY   levothyroxine (SYNTHROID) 100 MCG tablet Take 100 mcg by mouth at bedtime.   Lidocaine-Collagen-Aloe Vera 2 % GEL Apply 1 Application topically every 2 (two) hours as needed (to lower lip for lip pain).   melatonin 5 MG TABS Take 5 mg by mouth at bedtime.   metoprolol succinate (TOPROL-XL) 25 MG 24 hr tablet Take 25 mg by mouth 2 (two) times daily.   Multiple Vitamin (MULTIVITAMIN) tablet Take 1 tablet by mouth daily.   nystatin (MYCOSTATIN/NYSTOP) powder Apply 1 Application topically every 12 (twelve) hours as needed (to groin area for skin breakdown).   ondansetron (ZOFRAN) 4 MG tablet Take 4 mg by mouth every 6 (six) hours as needed for nausea or vomiting.   polyethylene glycol powder (GLYCOLAX/MIRALAX) 17 GM/SCOOP powder Take 1 Container by mouth every other day.   potassium chloride (KLOR-CON M) 10 MEQ tablet Take 10 mEq by mouth daily.   senna-docusate (SENOKOT-S) 8.6-50 MG tablet Take 2 tablets by mouth at bedtime.   triamcinolone cream (KENALOG) 0.1 % Apply 1 Application topically every 8 (eight) hours as needed (for itchy skin and legs).   No facility-administered  encounter medications on file as of 11/11/2022.    Review of Systems  Immunization History  Administered Date(s) Administered   Influenza Inj Mdck Quad Pf 10/29/2015, 11/03/2017, 10/30/2018   Influenza-Unspecified 10/26/2014, 11/10/2016, 11/23/2021   Moderna Covid-19 Vaccine Bivalent Booster 82yrs & up 10/30/2020, 07/06/2021   Moderna SARS-COV2 Booster Vaccination 12/25/2019   Moderna Sars-Covid-2 Vaccination 02/20/2019, 03/20/2019, 03/18/2020, 06/26/2020, 12/17/2021   PFIZER(Purple Top)SARS-COV-2 Vaccination 08/07/2019, 08/30/2019   PNEUMOCOCCAL CONJUGATE-20 09/01/2022   Pfizer Covid-19 Vaccine Bivalent Booster 10yrs & up 10/30/2020   Pneumococcal Polysaccharide-23 04/24/2020   Tdap 10/03/2022   Zoster Recombinant(Shingrix) 10/20/2017, 12/21/2017   Pertinent  Health Maintenance Due  Topic Date Due   OPHTHALMOLOGY EXAM  Never done   INFLUENZA VACCINE  09/08/2022   HEMOGLOBIN A1C  12/17/2022   FOOT EXAM  04/19/2023   DEXA SCAN  Discontinued  10/12/2020   10:54 AM 10/12/2020    8:00 PM 10/13/2020    8:22 AM 07/21/2022    8:41 PM 09/01/2022   11:22 AM  Fall Risk  Falls in the past year?    0 0  Was there an injury with Fall?    0   Fall Risk Category Calculator    0   (RETIRED) Patient Fall Risk Level High fall risk High fall risk High fall risk     Functional Status Survey:    Vitals:   11/11/22 1639  BP: 120/60  Pulse: 70  Resp: 18  Temp: (!) 97.4 F (36.3 C)  SpO2: 97%  Weight: 186 lb 3.2 oz (84.5 kg)  Height: 5\' 5"  (1.651 m)   Body mass index is 30.99 kg/m. Physical Exam Constitutional:      Appearance: Normal appearance.  Cardiovascular:     Rate and Rhythm: Normal rate.     Pulses: Normal pulses.  Pulmonary:     Effort: Pulmonary effort is normal.  Abdominal:     General: Abdomen is flat.     Palpations: Abdomen is soft.  Neurological:     Mental Status: She is alert. She is disoriented.     Labs reviewed: Recent Labs    04/21/22 0000  06/16/22 0000 09/13/22 0000  NA 138 139 139  K 4.4 4.6 4.2  CL 106 105 106  CO2 25* 26* 23*  BUN 21 28* 24*  CREATININE 1.2* 1.3* 1.3*  CALCIUM 9.2 9.7 9.5   Recent Labs    11/18/21 0000 12/09/21 0000 04/21/22 0000 06/16/22 0000  AST 15 15 17   --   ALT 8 9 9   --   ALKPHOS 81  --  73  --   ALBUMIN 3.6 3.4* 3.4* 3.8   Recent Labs    04/21/22 0000 06/08/22 0000 09/13/22 0000  WBC 5.4 5.8 5.5  NEUTROABS 2,630.00 3,045.00 3,234.00  HGB 10.5* 10.0* 9.1*  HCT 33* 31* 29*  PLT 165 198 166   Lab Results  Component Value Date   TSH 0.83 11/18/2021   Lab Results  Component Value Date   HGBA1C 5.8 06/16/2022   Lab Results  Component Value Date   CHOL 131 12/09/2021   HDL 40 12/09/2021   LDLCALC 64 12/09/2021   TRIG 206 (A) 12/09/2021    Significant Diagnostic Results in last 30 days:  No results found.  Assessment/Plan Dysuria Patient with dysuria for the last couple of days.  Unable to clarify any additional symptoms.  Some increase in frequency.  Will collect urinalysis and culture at this time. Of note patient's most recent urinary study negative for infection August 2024.   Family/ staff Communication: Nursing  Labs/tests ordered:  UA/UC

## 2022-11-13 DIAGNOSIS — N390 Urinary tract infection, site not specified: Secondary | ICD-10-CM | POA: Diagnosis not present

## 2022-11-17 DIAGNOSIS — R634 Abnormal weight loss: Secondary | ICD-10-CM | POA: Diagnosis not present

## 2022-11-17 DIAGNOSIS — F0150 Vascular dementia without behavioral disturbance: Secondary | ICD-10-CM | POA: Diagnosis not present

## 2022-11-17 DIAGNOSIS — R54 Age-related physical debility: Secondary | ICD-10-CM | POA: Diagnosis not present

## 2022-11-17 DIAGNOSIS — Z515 Encounter for palliative care: Secondary | ICD-10-CM | POA: Diagnosis not present

## 2022-11-17 DIAGNOSIS — M170 Bilateral primary osteoarthritis of knee: Secondary | ICD-10-CM | POA: Diagnosis not present

## 2022-11-17 DIAGNOSIS — R1312 Dysphagia, oropharyngeal phase: Secondary | ICD-10-CM | POA: Diagnosis not present

## 2022-11-28 ENCOUNTER — Other Ambulatory Visit: Payer: Self-pay | Admitting: Student

## 2022-11-28 DIAGNOSIS — Z515 Encounter for palliative care: Secondary | ICD-10-CM | POA: Diagnosis not present

## 2022-11-28 DIAGNOSIS — M179 Osteoarthritis of knee, unspecified: Secondary | ICD-10-CM

## 2022-11-28 DIAGNOSIS — M170 Bilateral primary osteoarthritis of knee: Secondary | ICD-10-CM | POA: Diagnosis not present

## 2022-11-28 DIAGNOSIS — R54 Age-related physical debility: Secondary | ICD-10-CM | POA: Diagnosis not present

## 2022-11-28 DIAGNOSIS — R1312 Dysphagia, oropharyngeal phase: Secondary | ICD-10-CM | POA: Diagnosis not present

## 2022-11-28 DIAGNOSIS — R634 Abnormal weight loss: Secondary | ICD-10-CM | POA: Diagnosis not present

## 2022-11-28 DIAGNOSIS — F0150 Vascular dementia without behavioral disturbance: Secondary | ICD-10-CM | POA: Diagnosis not present

## 2022-11-28 MED ORDER — HYDROCODONE-ACETAMINOPHEN 5-325 MG PO TABS
1.0000 | ORAL_TABLET | Freq: Two times a day (BID) | ORAL | 0 refills | Status: DC
Start: 2022-11-28 — End: 2022-12-26

## 2022-11-28 NOTE — Progress Notes (Signed)
Refill longs standing pain medication

## 2022-12-05 DIAGNOSIS — E1159 Type 2 diabetes mellitus with other circulatory complications: Secondary | ICD-10-CM | POA: Diagnosis not present

## 2022-12-14 DIAGNOSIS — I10 Essential (primary) hypertension: Secondary | ICD-10-CM | POA: Diagnosis not present

## 2022-12-14 DIAGNOSIS — E1159 Type 2 diabetes mellitus with other circulatory complications: Secondary | ICD-10-CM | POA: Diagnosis not present

## 2022-12-14 DIAGNOSIS — E039 Hypothyroidism, unspecified: Secondary | ICD-10-CM | POA: Diagnosis not present

## 2022-12-14 LAB — COMPREHENSIVE METABOLIC PANEL
Albumin: 3.3 — AB (ref 3.5–5.0)
Calcium: 9 (ref 8.7–10.7)
Globulin: 2.6

## 2022-12-14 LAB — LIPID PANEL
Cholesterol: 86 (ref 0–200)
HDL: 30 — AB (ref 35–70)
LDL Cholesterol: 29
Triglycerides: 207 — AB (ref 40–160)

## 2022-12-14 LAB — BASIC METABOLIC PANEL
BUN: 23 — AB (ref 4–21)
CO2: 27 — AB (ref 13–22)
Chloride: 108 (ref 99–108)
Creatinine: 1.1 (ref 0.5–1.1)
Glucose: 77
Potassium: 4.1 meq/L (ref 3.5–5.1)
Sodium: 140 (ref 137–147)

## 2022-12-14 LAB — HEPATIC FUNCTION PANEL
ALT: 9 U/L (ref 7–35)
AST: 15 (ref 13–35)
Alkaline Phosphatase: 59 (ref 25–125)
Bilirubin, Total: 0.5

## 2022-12-14 LAB — CBC AND DIFFERENTIAL
HCT: 31 — AB (ref 36–46)
Hemoglobin: 9.7 — AB (ref 12.0–16.0)
Neutrophils Absolute: 3408
Platelets: 180 10*3/uL (ref 150–400)
WBC: 6

## 2022-12-14 LAB — CBC: RBC: 3.39 — AB (ref 3.87–5.11)

## 2022-12-22 DIAGNOSIS — M170 Bilateral primary osteoarthritis of knee: Secondary | ICD-10-CM | POA: Diagnosis not present

## 2022-12-22 DIAGNOSIS — F0150 Vascular dementia without behavioral disturbance: Secondary | ICD-10-CM | POA: Diagnosis not present

## 2022-12-22 DIAGNOSIS — R1312 Dysphagia, oropharyngeal phase: Secondary | ICD-10-CM | POA: Diagnosis not present

## 2022-12-22 DIAGNOSIS — R634 Abnormal weight loss: Secondary | ICD-10-CM | POA: Diagnosis not present

## 2022-12-22 DIAGNOSIS — R54 Age-related physical debility: Secondary | ICD-10-CM | POA: Diagnosis not present

## 2022-12-22 DIAGNOSIS — Z515 Encounter for palliative care: Secondary | ICD-10-CM | POA: Diagnosis not present

## 2022-12-26 ENCOUNTER — Encounter: Payer: Self-pay | Admitting: Student

## 2022-12-26 ENCOUNTER — Non-Acute Institutional Stay (SKILLED_NURSING_FACILITY): Payer: Self-pay | Admitting: Student

## 2022-12-26 ENCOUNTER — Other Ambulatory Visit: Payer: Self-pay | Admitting: Student

## 2022-12-26 DIAGNOSIS — E119 Type 2 diabetes mellitus without complications: Secondary | ICD-10-CM

## 2022-12-26 DIAGNOSIS — F0150 Vascular dementia without behavioral disturbance: Secondary | ICD-10-CM | POA: Diagnosis not present

## 2022-12-26 DIAGNOSIS — I10 Essential (primary) hypertension: Secondary | ICD-10-CM | POA: Diagnosis not present

## 2022-12-26 DIAGNOSIS — I5032 Chronic diastolic (congestive) heart failure: Secondary | ICD-10-CM

## 2022-12-26 DIAGNOSIS — F325 Major depressive disorder, single episode, in full remission: Secondary | ICD-10-CM | POA: Diagnosis not present

## 2022-12-26 DIAGNOSIS — M179 Osteoarthritis of knee, unspecified: Secondary | ICD-10-CM

## 2022-12-26 DIAGNOSIS — I480 Paroxysmal atrial fibrillation: Secondary | ICD-10-CM

## 2022-12-26 MED ORDER — HYDROCODONE-ACETAMINOPHEN 5-325 MG PO TABS: 1.0000 | ORAL_TABLET | Freq: Two times a day (BID) | ORAL | 0 refills | Status: DC

## 2022-12-26 NOTE — Progress Notes (Signed)
Refill of chronic pain medications.

## 2022-12-26 NOTE — Progress Notes (Signed)
Location:  Other Nursing Home Room Number: [facility] [address] of Service:  SNF [phone]) Provider:  Ander Gaster, Benetta Spar, MD  Patient Care Team: Earnestine Mealing, MD as PCP - General [facility] Medicine)  Extended Emergency Contact Information Primary Emergency Contact: Okey Dupre Address: [address]          [place], [place] [address] D[place] of [place] Home Phone: [phone] Work Phone: [phone] Mobile Phone: [phone] Relation: Daughter Secondary Emergency Contact: Donata Duff Mobile Phone: [phone] Relation: Son  Code Status:  DNR Goals of care: Advanced Directive information    11/11/2022    4:53 PM  Advanced Directives  Does Patient Have a Medical Advance Directive? Yes  Type of Advance Directive Out of facility DNR (pink MOST or yellow form)  Does patient want to make changes to medical advance directive? No - Patient declined     Chief Complaint  Patient presents with   Medical Management of Chronic Conditions     HPI:  Pt is a 89 y.o. female seen today for medical management of chronic diseases.    Patient with continued decline per nursing. Slower speech. Total dependence except with feeding. Lost numerous teeth in the last few months.   Past Medical History:  Diagnosis Date   Anemia    iron def.anemia   Aortic stenosis, severe    Asthma    Chest pain    Colon cancer (HCC)    Complication of anesthesia    nausea   Diabetes mellitus without complication (HCC)    Dyslipidemia    Dyspnea    Gout, joint    H/O postmenopausal osteoporosis    Hematuria    History of palpitations    Hx of colonic polyps    Hypertension    Hypothyroidism    IBS (irritable bowel syndrome)    Schatzki's ring    Sleep apnea    Past Surgical History:  Procedure Laterality Date   AORTIC VALVE REPLACEMENT     CARDIAC PACEMAKER PLACEMENT     CHOLECYSTECTOMY     COLON SURGERY  1997   RIGHT HEMICOLECTOMY   COLONOSCOPY     COLONOSCOPY WITH  PROPOFOL N/A 09/07/2016   Procedure: COLONOSCOPY WITH PROPOFOL;  Surgeon: Scot Jun, MD;  Location: [facility] ENDOSCOPY;  Service: Endoscopy;  Laterality: N/A;   COLONOSCOPY WITH PROPOFOL N/A 10/11/2020   Procedure: COLONOSCOPY WITH PROPOFOL;  Surgeon: Toney Reil, MD;  Location: [facility] ENDOSCOPY;  Service: Gastroenterology;  Laterality: N/A;   ESOPHAGOGASTRODUODENOSCOPY     ESOPHAGOGASTRODUODENOSCOPY (EGD) WITH PROPOFOL N/A 10/11/2020   Procedure: ESOPHAGOGASTRODUODENOSCOPY (EGD) WITH PROPOFOL;  Surgeon: Toney Reil, MD;  Location: [facility] ENDOSCOPY;  Service: Gastroenterology;  Laterality: N/A;   GIVENS CAPSULE STUDY  10/11/2020   Procedure: GIVENS CAPSULE STUDY;  Surgeon: Toney Reil, MD;  Location: [facility] ENDOSCOPY;  Service: Gastroenterology;;   HIP ARTHROPLASTY Left 09/05/2019   Procedure: ARTHROPLASTY BIPOLAR HIP (HEMIARTHROPLASTY);  Surgeon: Christena Flake, MD;  Location: [facility] ORS;  Service: Orthopedics;  Laterality: Left;   TEE WITHOUT CARDIOVERSION  03/08/2013    Allergies  Allergen Reactions   Amoxicillin Itching and Rash    Breaks out in a rash On hands On hands    Erythromycin Nausea And Vomiting and Other (See Comments)   Clindamycin/Lincomycin    Tramadol     Outpatient Encounter Medications as of 12/26/2022  Medication Sig   acetaminophen (TYLENOL) 500 MG tablet Take 500 mg by mouth 3 (three) times daily.   albuterol (VENTOLIN HFA)  108 (90 Base) MCG/ACT inhaler Inhale 2 puffs into the lungs every 6 (six) hours as needed for wheezing or shortness of breath.   atorvastatin (LIPITOR) 10 MG tablet Take 10 mg by mouth daily.   bisacodyl (DULCOLAX) 10 MG suppository Place 10 mg rectally daily as needed for moderate constipation.   bismuth subsalicylate (PEPTO BISMOL) 262 MG/15ML suspension Take 30 mLs by mouth every 4 (four) hours as needed.   cetirizine (ZYRTEC) 10 MG tablet Take 10 mg by mouth daily.   Cholecalciferol (VITAMIN D3) 50 MCG (2000 UT) TABS Take  2,000 Units by mouth daily.   citalopram (CELEXA) 10 MG tablet Take 10 mg by mouth daily. Time of day not specified per [facility] EMR system [employer] (see other listings)   diclofenac Sodium (VOLTAREN) 1 % GEL 4 g 4 (four) times daily. To left knee   ELIQUIS 5 MG TABS tablet Take 5 mg by mouth 2 (two) times daily.   famotidine (PEPCID) 10 MG tablet Take 10 mg by mouth at bedtime.   ferrous sulfate 325 (65 FE) MG tablet Take 1 tablet (325 mg total) by mouth daily with breakfast.   furosemide (LASIX) 20 MG tablet Take 20 mg by mouth daily.   HYDROcodone-acetaminophen (NORCO/VICODIN) 5-325 MG tablet Take 1 tablet by mouth 2 (two) times daily.   levothyroxine (SYNTHROID) 100 MCG tablet Take 100 mcg by mouth at bedtime.   Lidocaine-Collagen-Aloe Vera 2 % GEL Apply 1 Application topically every 2 (two) hours as needed (to lower lip for lip pain).   melatonin 5 MG TABS Take 5 mg by mouth at bedtime.   metoprolol succinate (TOPROL-XL) 25 MG 24 hr tablet Take 25 mg by mouth 2 (two) times daily.   Multiple Vitamin (MULTIVITAMIN) tablet Take 1 tablet by mouth daily.   nystatin (MYCOSTATIN/NYSTOP) powder Apply 1 Application topically every 12 (twelve) hours as needed (to groin area for skin breakdown).   ondansetron (ZOFRAN) 4 MG tablet Take 4 mg by mouth every 6 (six) hours as needed for nausea or vomiting.   polyethylene glycol powder (GLYCOLAX/MIRALAX) 17 GM/SCOOP powder Take 1 Container by mouth every other day.   potassium chloride (KLOR-CON M) 10 MEQ tablet Take 10 mEq by mouth daily.   senna-docusate (SENOKOT-S) 8.6-50 MG tablet Take 2 tablets by mouth at bedtime.   triamcinolone cream (KENALOG) 0.1 % Apply 1 Application topically every 8 (eight) hours as needed (for itchy skin and legs).   No facility-administered encounter medications on file as of 12/26/2022.    Review of Systems  Immunization History  Administered Date(s) Administered   Influenza Inj Mdck Quad Pf 10/29/2015,  11/03/2017, 10/30/2018   Influenza-Unspecified 10/26/2014, 11/10/2016, 11/23/2021   Moderna Covid-19 Vaccine Bivalent Booster 37yrs & up 10/30/2020, 07/06/2021   Moderna SARS-COV2 Booster Vaccination 12/25/2019   Moderna Sars-Covid-2 Vaccination 02/20/2019, 03/20/2019, 03/18/2020, 06/26/2020, 12/17/2021   PFIZER(Purple Top)SARS-COV-2 Vaccination 08/07/2019, 08/30/2019   PNEUMOCOCCAL CONJUGATE-20 09/01/2022   Pfizer Covid-19 Vaccine Bivalent Booster 68yrs & up 10/30/2020   Pneumococcal Polysaccharide-23 04/24/2020   Tdap 10/03/2022   Zoster Recombinant(Shingrix) 10/20/2017, 12/21/2017   Pertinent  Health Maintenance Due  Topic Date Due   OPHTHALMOLOGY EXAM  Never done   INFLUENZA VACCINE  09/08/2022   HEMOGLOBIN A1C  12/17/2022   FOOT EXAM  04/19/2023   DEXA SCAN  Discontinued      10/12/2020   10:54 AM 10/12/2020    8:00 PM 10/13/2020    8:22 AM 07/21/2022    8:41 PM 09/01/2022  11:22 AM  Fall Risk  Falls in the past year?    0 0  Was there an injury with Fall?    0   Fall Risk Category Calculator    0   (RETIRED) Patient Fall Risk Level High fall risk High fall risk High fall risk     Functional Status Survey:    Vitals:   12/26/22 0938  BP: 122/61  Pulse: 70  Resp: 14  Temp: 97.8 F (36.6 C)  SpO2: 97%  Weight: 184 lb 12.8 oz (83.8 kg)   Body mass index is 30.75 kg/m. Physical Exam Constitutional:      Comments: Obese, in wheelchair  Cardiovascular:     Rate and Rhythm: Normal rate.     Pulses: Normal pulses.  Pulmonary:     Effort: Pulmonary effort is normal.  Abdominal:     General: Abdomen is flat.  Skin:    General: Skin is warm.  Neurological:     Mental Status: She is alert. She is disoriented.     Labs reviewed: Recent Labs    04/21/22 0000 06/16/22 0000 09/13/22 0000  NA 138 139 139  K 4.4 4.6 4.2  CL 106 105 106  CO2 25* 26* 23*  BUN 21 28* 24*  CREATININE 1.2* 1.3* 1.3*  CALCIUM 9.2 9.7 9.5   Recent Labs    04/21/22 0000  06/16/22 0000  AST 17  --   ALT 9  --   ALKPHOS 73  --   ALBUMIN 3.4* 3.8   Recent Labs    04/21/22 0000 06/08/22 0000 09/13/22 0000  WBC 5.4 5.8 5.5  NEUTROABS 2,630.00 3,045.00 3,234.00  HGB 10.5* 10.0* 9.1*  HCT 33* 31* 29*  PLT 165 198 166   Lab Results  Component Value Date   TSH 0.83 11/18/2021   Lab Results  Component Value Date   HGBA1C 5.8 06/16/2022   Lab Results  Component Value Date   CHOL 131 12/09/2021   HDL 40 12/09/2021   LDLCALC 64 12/09/2021   TRIG 206 (A) 12/09/2021    Significant Diagnostic Results in last 30 days:  No results found.  Assessment/Plan Vascular dementia without behavioral disturbance (HCC)  PAF (paroxysmal atrial fibrillation) (HCC)  Depression, major, in remission (HCC)  Chronic diastolic (congestive) heart failure (HCC)  Diabetes mellitus type II, non insulin dependent (HCC)  Benign essential hypertension Patient with progression of dementia. Slow speech. Continue GOC conversations with family. Rate well-controlled. No signs of bleeding at this time. Mood stable. Appears euvolemic on exam. DM well-controlled, repeat labs q6 mo. BP well=controlled.   Family/ staff Communication: nursing  Labs/tests ordered:  none

## 2022-12-30 ENCOUNTER — Encounter: Payer: Self-pay | Admitting: Student

## 2022-12-30 ENCOUNTER — Non-Acute Institutional Stay (SKILLED_NURSING_FACILITY): Payer: Self-pay | Admitting: Student

## 2022-12-30 DIAGNOSIS — F0150 Vascular dementia without behavioral disturbance: Secondary | ICD-10-CM | POA: Diagnosis not present

## 2022-12-30 DIAGNOSIS — N952 Postmenopausal atrophic vaginitis: Secondary | ICD-10-CM | POA: Diagnosis not present

## 2022-12-30 NOTE — Progress Notes (Unsigned)
Location:  [facility] Room Number: $01 A Place of Service:  SNF [phone]) Provider:  Earnestine Mealing, MD  Patient Care Team: Earnestine Mealing, MD as PCP - General (Family Medicine)  Extended Emergency Contact Information Primary Emergency Contact: Okey Dupre Address: [address]          [place], [place] [address] [place] of [place] Home Phone: [phone] Work Phone: [phone] Mobile Phone: [phone] Relation: Daughter Secondary Emergency Contact: Donata Duff Mobile Phone: [phone] Relation: Son  Code Status:  DNR Goals of care: Advanced Directive information    12/30/2022   10:05 AM  Advanced Directives  Does Patient Have a Medical Advance Directive? Yes  Type of Advance Directive Out of facility DNR (pink MOST or yellow form)  Does patient want to make changes to medical advance directive? No - Patient declined  Pre-existing out of facility DNR order (yellow form or pink MOST form) Yellow form placed in chart (order not valid for inpatient use);Pink MOST form placed in chart (order not valid for inpatient use)     Chief Complaint  Patient presents with   Acute Visit    Vaginal irritation     HPI:  Pt is a 89 y.o. female seen today for and routine acute visit for dysuria and itching.  Patient with recent urinary tract infection status post treatment.  She is very slow in her verbal responses and states it is itching.  Contacted patient's daughter to discuss current goals of care.  Of note patient has had significant decline in her verbal communication last few months.  She is still able to feed herself, however she is having increased confusion and multiple urinary tract infections.  Her daughter asked "how long this is, last" discussed concern the patient has potentially a 90-month prognosis given how significantly she has declined in the last year.  She becomes tearful yet relieved and states she would agree with a consultation to  hospice at this time. Of note patient is down 10 pounds in the last year.  She is also lost multiple teeth without injury.  Past Medical History:  Diagnosis Date   Anemia    iron def.anemia   Aortic stenosis, severe    Asthma    Chest pain    Colon cancer (HCC)    Complication of anesthesia    nausea   Diabetes mellitus without complication (HCC)    Dyslipidemia    Dyspnea    Gout, joint    H/O postmenopausal osteoporosis    Hematuria    History of palpitations    Hx of colonic polyps    Hypertension    Hypothyroidism    IBS (irritable bowel syndrome)    Schatzki's ring    Sleep apnea    Past Surgical History:  Procedure Laterality Date   AORTIC VALVE REPLACEMENT     CARDIAC PACEMAKER PLACEMENT     CHOLECYSTECTOMY     COLON SURGERY  1997   RIGHT HEMICOLECTOMY   COLONOSCOPY     COLONOSCOPY WITH PROPOFOL N/A 09/07/2016   Procedure: COLONOSCOPY WITH PROPOFOL;  Surgeon: Scot Jun, MD;  Location: [facility] ENDOSCOPY;  Service: Endoscopy;  Laterality: N/A;   COLONOSCOPY WITH PROPOFOL N/A 10/11/2020   Procedure: COLONOSCOPY WITH PROPOFOL;  Surgeon: Toney Reil, MD;  Location: [facility] ENDOSCOPY;  Service: Gastroenterology;  Laterality: N/A;   ESOPHAGOGASTRODUODENOSCOPY     ESOPHAGOGASTRODUODENOSCOPY (EGD) WITH PROPOFOL N/A 10/11/2020   Procedure: ESOPHAGOGASTRODUODENOSCOPY (EGD) WITH PROPOFOL;  Surgeon: Toney Reil, MD;  Location: [facility] ENDOSCOPY;  Service: Gastroenterology;  Laterality: N/A;   GIVENS CAPSULE STUDY  10/11/2020   Procedure: GIVENS CAPSULE STUDY;  Surgeon: Toney Reil, MD;  Location: [facility] ENDOSCOPY;  Service: Gastroenterology;;   HIP ARTHROPLASTY Left 09/05/2019   Procedure: ARTHROPLASTY BIPOLAR HIP (HEMIARTHROPLASTY);  Surgeon: Christena Flake, MD;  Location: [facility] ORS;  Service: Orthopedics;  Laterality: Left;   TEE WITHOUT CARDIOVERSION  03/08/2013    Allergies  Allergen Reactions   Amoxicillin Itching and Rash    Breaks out in a rash On  hands On hands    Erythromycin Nausea And Vomiting and Other (See Comments)   Clindamycin/Lincomycin    Tramadol     Outpatient Encounter Medications as of 12/30/2022  Medication Sig   acetaminophen (TYLENOL) 500 MG tablet Take 500 mg by mouth 3 (three) times daily.   albuterol (VENTOLIN HFA) 108 (90 Base) MCG/ACT inhaler Inhale 2 puffs into the lungs every 6 (six) hours as needed for wheezing or shortness of breath.   atorvastatin (LIPITOR) 10 MG tablet Take 10 mg by mouth daily.   bisacodyl (DULCOLAX) 10 MG suppository Place 10 mg rectally daily as needed for moderate constipation.   bismuth subsalicylate (PEPTO BISMOL) 262 MG/15ML suspension Take 30 mLs by mouth every 4 (four) hours as needed.   cetirizine (ZYRTEC) 10 MG tablet Take 10 mg by mouth daily.   Cholecalciferol (VITAMIN D3) 50 MCG (2000 UT) TABS Take 2,000 Units by mouth daily.   citalopram (CELEXA) 10 MG tablet Take 10 mg by mouth daily. Time of day not specified per [facility] EMR system [facility] [employer] [facility] (see other listings)   diclofenac Sodium (VOLTAREN) 1 % GEL 4 g 4 (four) times daily. To left knee   ELIQUIS 5 MG TABS tablet Take 5 mg by mouth 2 (two) times daily.   famotidine (PEPCID) 10 MG tablet Take 10 mg by mouth at bedtime.   ferrous sulfate 325 (65 FE) MG tablet Take 1 tablet (325 mg total) by mouth daily with breakfast.   furosemide (LASIX) 20 MG tablet Take 20 mg by mouth daily.   HYDROcodone-acetaminophen (NORCO/VICODIN) 5-325 MG tablet Take 1 tablet by mouth in the morning and at bedtime.   levothyroxine (SYNTHROID) 100 MCG tablet Take 100 mcg by mouth at bedtime.   Lidocaine-Collagen-Aloe Vera 2 % GEL Apply 1 Application topically every 2 (two) hours as needed (to lower lip for lip pain).   melatonin 5 MG TABS Take 5 mg by mouth at bedtime.   metoprolol succinate (TOPROL-XL) 25 MG 24 hr tablet Take 25 mg by mouth 2 (two) times daily.   Multiple Vitamin (MULTIVITAMIN) tablet Take 1 tablet by mouth daily.    nystatin (MYCOSTATIN/NYSTOP) powder Apply 1 Application topically every 12 (twelve) hours as needed (to groin area for skin breakdown).   ondansetron (ZOFRAN) 4 MG tablet Take 4 mg by mouth every 6 (six) hours as needed for nausea or vomiting.   polyethylene glycol powder (GLYCOLAX/MIRALAX) 17 GM/SCOOP powder Take 1 Container by mouth every other day.   potassium chloride (KLOR-CON M) 10 MEQ tablet Take 10 mEq by mouth daily.   senna-docusate (SENOKOT-S) 8.6-50 MG tablet Take 2 tablets by mouth at bedtime.   triamcinolone cream (KENALOG) 0.1 % Apply 1 Application topically every 8 (eight) hours as needed (for itchy skin and legs).   No facility-administered encounter medications on file as of 12/30/2022.    Review of Systems  Immunization History  Administered Date(s) Administered   Influenza Inj Mdck  Quad Pf 10/29/2015, 11/03/2017, 10/30/2018   Influenza, High Dose Seasonal PF 11/30/2022   Influenza-Unspecified 10/26/2014, 11/10/2016, 11/23/2021   Moderna Covid-19 Vaccine Bivalent Booster 75yrs & up 10/30/2020, 07/06/2021   Moderna SARS-COV2 Booster Vaccination 12/25/2019   Moderna Sars-Covid-2 Vaccination 02/20/2019, 03/20/2019, 03/18/2020, 06/26/2020, 12/17/2021   PFIZER(Purple Top)SARS-COV-2 Vaccination 08/07/2019, 08/30/2019   PNEUMOCOCCAL CONJUGATE-20 09/01/2022   Pfizer Covid-19 Vaccine Bivalent Booster 38yrs & up 10/30/2020   Pneumococcal Polysaccharide-23 04/24/2020   Tdap 10/03/2022   Zoster Recombinant(Shingrix) 10/20/2017, 12/21/2017   Pertinent  Health Maintenance Due  Topic Date Due   OPHTHALMOLOGY EXAM  Never done   HEMOGLOBIN A1C  12/17/2022   FOOT EXAM  04/19/2023   INFLUENZA VACCINE  Completed   DEXA SCAN  Discontinued      10/12/2020   10:54 AM 10/12/2020    8:00 PM 10/13/2020    8:22 AM 07/21/2022    8:41 PM 09/01/2022   11:22 AM  Fall Risk  Falls in the past year?    0 0  Was there an injury with Fall?    0   Fall Risk Category Calculator    0   (RETIRED)  Patient Fall Risk Level High fall risk High fall risk High fall risk     Functional Status Survey:    Vitals:   12/30/22 0953  BP: 129/69  Pulse: 60  Weight: 184 lb 12.8 oz (83.8 kg)  Height: 5\' 5"  (1.651 m)   Body mass index is 30.75 kg/m. Physical Exam Constitutional:      Appearance: She is obese.  Cardiovascular:     Pulses: Normal pulses.  Pulmonary:     Effort: Pulmonary effort is normal.  Genitourinary:    Comments: Hypotrophic vulva, erythema  Skin:    General: Skin is warm.  Neurological:     Mental Status: She is alert. She is disoriented.     Labs reviewed: Recent Labs    06/16/22 0000 09/13/22 0000 12/14/22 0000  NA 139 139 140  K 4.6 4.2 4.1  CL 105 106 108  CO2 26* 23* 27*  BUN 28* 24* 23*  CREATININE 1.3* 1.3* 1.1  CALCIUM 9.7 9.5 9.0   Recent Labs    04/21/22 0000 06/16/22 0000 12/14/22 0000  AST 17  --  15  ALT 9  --  9  ALKPHOS 73  --  59  ALBUMIN 3.4* 3.8 3.3*   Recent Labs    06/08/22 0000 09/13/22 0000 12/14/22 0000  WBC 5.8 5.5 6.0  NEUTROABS 3,045.00 3,234.00 3,408.00  HGB 10.0* 9.1* 9.7*  HCT 31* 29* 31*  PLT 198 166 180   Lab Results  Component Value Date   TSH 0.83 11/18/2021   Lab Results  Component Value Date   HGBA1C 5.8 06/16/2022   Lab Results  Component Value Date   CHOL 86 12/14/2022   HDL 30 (A) 12/14/2022   LDLCALC 29 12/14/2022   TRIG 207 (A) 12/14/2022    Significant Diagnostic Results in last 30 days:  No results found.  Assessment/Plan Vascular dementia without behavioral disturbance (HCC) - Plan: Ambulatory referral to Hospice  Vaginal atrophy Patient with history of dementia significant decline in the last year.  She is having difficulty with speech as well as has had a 10 pound weight loss in the last year.  She has poor dentition and has lost 6 teeth in the last 5 months.  She is primarily wheelchair dependent and requires assistance for dressing, activities of daily life.  Fast  stage  VIc.  Given concern for patient's decline and multiple urinary tract infections discussed prognosis has decreased to likely a 68-month timeframe.  Will refer to hospice at this time.  All questions answered.  Given some dysuria and bulge on par atrophy will start at topical estrogen.  Of note patient had recent urinary tract infection status post treatment with appropriate antibiotics, will give one-time dose of 150 mg fluconazole for possible yeast infection.  Follow-up on Monday for symptoms.  Family/ staff Communication: nursing, Allyson  Labs/tests ordered:  none

## 2022-12-31 ENCOUNTER — Encounter: Payer: Self-pay | Admitting: Student

## 2023-01-02 DIAGNOSIS — Z119 Encounter for screening for infectious and parasitic diseases, unspecified: Secondary | ICD-10-CM | POA: Diagnosis not present

## 2023-02-07 DIAGNOSIS — Z961 Presence of intraocular lens: Secondary | ICD-10-CM | POA: Diagnosis not present

## 2023-02-07 DIAGNOSIS — H31011 Macula scars of posterior pole (postinflammatory) (post-traumatic), right eye: Secondary | ICD-10-CM | POA: Diagnosis not present

## 2023-02-07 DIAGNOSIS — E119 Type 2 diabetes mellitus without complications: Secondary | ICD-10-CM | POA: Diagnosis not present

## 2023-02-07 DIAGNOSIS — H524 Presbyopia: Secondary | ICD-10-CM | POA: Diagnosis not present

## 2023-02-07 DIAGNOSIS — H16143 Punctate keratitis, bilateral: Secondary | ICD-10-CM | POA: Diagnosis not present

## 2023-02-07 LAB — HM DIABETES EYE EXAM

## 2023-02-08 DIAGNOSIS — R300 Dysuria: Secondary | ICD-10-CM | POA: Diagnosis not present

## 2023-02-09 ENCOUNTER — Encounter: Payer: Self-pay | Admitting: Student

## 2023-02-14 ENCOUNTER — Encounter: Payer: Self-pay | Admitting: Nurse Practitioner

## 2023-02-14 ENCOUNTER — Non-Acute Institutional Stay (SKILLED_NURSING_FACILITY): Payer: Self-pay | Admitting: Nurse Practitioner

## 2023-02-14 DIAGNOSIS — I5032 Chronic diastolic (congestive) heart failure: Secondary | ICD-10-CM | POA: Diagnosis not present

## 2023-02-14 DIAGNOSIS — F325 Major depressive disorder, single episode, in full remission: Secondary | ICD-10-CM | POA: Diagnosis not present

## 2023-02-14 DIAGNOSIS — N1832 Chronic kidney disease, stage 3b: Secondary | ICD-10-CM

## 2023-02-14 DIAGNOSIS — E039 Hypothyroidism, unspecified: Secondary | ICD-10-CM

## 2023-02-14 DIAGNOSIS — I480 Paroxysmal atrial fibrillation: Secondary | ICD-10-CM | POA: Diagnosis not present

## 2023-02-14 DIAGNOSIS — F0150 Vascular dementia without behavioral disturbance: Secondary | ICD-10-CM

## 2023-02-14 DIAGNOSIS — E119 Type 2 diabetes mellitus without complications: Secondary | ICD-10-CM | POA: Diagnosis not present

## 2023-02-14 DIAGNOSIS — M179 Osteoarthritis of knee, unspecified: Secondary | ICD-10-CM

## 2023-02-14 DIAGNOSIS — I10 Essential (primary) hypertension: Secondary | ICD-10-CM

## 2023-02-14 NOTE — Progress Notes (Signed)
 Location:  Other [place].  Nursing Home Room Number: [facility] 410A Place of Service:  SNF [phone]) Harlene An, NP  PCP: Abdul Fine, MD  Patient Care Team: Abdul Fine, MD as PCP - General [facility] Medicine)  Extended Emergency Contact Information Primary Emergency Contact: Erlene Peyton KIDD Address: [address]          [place], [place] [address] [place] Home Phone: [phone] Work Phone: [phone] Mobile Phone: [phone] Relation: Daughter Secondary Emergency Contact: renie rush Mobile Phone: [phone] Relation: Son  Goals of care: Advanced Directive information    02/14/2023   12:16 PM  Advanced Directives  Does Patient Have a Medical Advance Directive? Yes  Type of Advance Directive Out of facility DNR (pink MOST or yellow form)  Does patient want to make changes to medical advance directive? No - Patient declined     Chief Complaint  Patient presents with   Medical Management of Chronic Issues    Medical Management of Chronic Issues.     HPI:  Pt is a 89 y.o. female seen today for medical management of chronic disease.  Pt with hx of progressing dementia, weight loss, anemia, CKD, CHF, hypothryoid and others.  She has had a gradual decline over the last several months with progression of her dementia and weight loss.  She is now on hospice services.  She sits in her wheelchair and appears to be comfortable. Unable to answer question due to advanced dementia. Nursing without acute concerns at this time.    Past Medical History:  Diagnosis Date   Anemia    iron  def.anemia   Aortic stenosis, severe    Asthma    Chest pain    Colon cancer (HCC)    Complication of anesthesia    nausea   Diabetes mellitus without complication (HCC)    Dyslipidemia    Dyspnea    Gout, joint    H/O postmenopausal osteoporosis    Hematuria    History of palpitations    Hx of colonic polyps    Hypertension    Hypothyroidism     IBS (irritable bowel syndrome)    Schatzki's ring    Sleep apnea    Past Surgical History:  Procedure Laterality Date   AORTIC VALVE REPLACEMENT     CARDIAC PACEMAKER PLACEMENT     CHOLECYSTECTOMY     COLON SURGERY  1997   RIGHT HEMICOLECTOMY   COLONOSCOPY     COLONOSCOPY WITH PROPOFOL  N/A 09/07/2016   Procedure: COLONOSCOPY WITH PROPOFOL ;  Surgeon: Viktoria Lamar DASEN, MD;  Location: [facility] ENDOSCOPY;  Service: Endoscopy;  Laterality: N/A;   COLONOSCOPY WITH PROPOFOL  N/A 10/11/2020   Procedure: COLONOSCOPY WITH PROPOFOL ;  Surgeon: Unk Corinn Skiff, MD;  Location: [facility] ENDOSCOPY;  Service: Gastroenterology;  Laterality: N/A;   ESOPHAGOGASTRODUODENOSCOPY     ESOPHAGOGASTRODUODENOSCOPY (EGD) WITH PROPOFOL  N/A 10/11/2020   Procedure: ESOPHAGOGASTRODUODENOSCOPY (EGD) WITH PROPOFOL ;  Surgeon: Unk Corinn Skiff, MD;  Location: [facility] ENDOSCOPY;  Service: Gastroenterology;  Laterality: N/A;   GIVENS CAPSULE STUDY  10/11/2020   Procedure: GIVENS CAPSULE STUDY;  Surgeon: Unk Corinn Skiff, MD;  Location: [facility] ENDOSCOPY;  Service: Gastroenterology;;   HIP ARTHROPLASTY Left 09/05/2019   Procedure: ARTHROPLASTY BIPOLAR HIP (HEMIARTHROPLASTY);  Surgeon: Edie Rush PARAS, MD;  Location: [facility] ORS;  Service: Orthopedics;  Laterality: Left;   TEE WITHOUT CARDIOVERSION  03/08/2013    Allergies  Allergen Reactions   Amoxicillin Itching and Rash    Breaks out in a rash On hands On hands  Erythromycin Nausea And Vomiting and Other (See Comments)   Clindamycin/Lincomycin    Tramadol      Outpatient Encounter Medications as of 02/14/2023  Medication Sig   acetaminophen  (TYLENOL ) 500 MG tablet Take 500 mg by mouth 3 (three) times daily.   atorvastatin  (LIPITOR) 10 MG tablet Take 10 mg by mouth daily.   cephALEXin (KEFLEX) 250 MG capsule Take 250 mg by mouth 3 (three) times daily.   citalopram  (CELEXA ) 10 MG tablet Take 10 mg by mouth daily. Time of day not specified per [facility] EMR system [employer]  (see other listings)   diclofenac  Sodium (VOLTAREN ) 1 % GEL 4 g 4 (four) times daily. To left knee   estradiol (ESTRACE) 0.1 MG/GM vaginal cream Place 1 Applicatorful vaginally at bedtime. Every Tues and Fri   famotidine (PEPCID) 10 MG tablet Take 10 mg by mouth at bedtime.   furosemide  (LASIX ) 20 MG tablet Take 20 mg by mouth daily.   HYDROcodone -acetaminophen  (NORCO/VICODIN) 5-325 MG tablet Take 1 tablet by mouth in the morning and at bedtime.   levothyroxine  (SYNTHROID ) 100 MCG tablet Take 100 mcg by mouth at bedtime.   Lidocaine -Collagen-Aloe Vera 2 % GEL Apply 1 Application topically every 2 (two) hours as needed (to lower lip for lip pain).   melatonin 5 MG TABS Take 5 mg by mouth at bedtime.   metoprolol  succinate (TOPROL -XL) 25 MG 24 hr tablet Take 25 mg by mouth 2 (two) times daily.   senna-docusate (SENOKOT-S) 8.6-50 MG tablet Take 2 tablets by mouth at bedtime.   [DISCONTINUED] albuterol  (VENTOLIN  HFA) 108 (90 Base) MCG/ACT inhaler Inhale 2 puffs into the lungs every 6 (six) hours as needed for wheezing or shortness of breath. (Patient not taking: Reported on 02/14/2023)   [DISCONTINUED] bisacodyl  (DULCOLAX) 10 MG suppository Place 10 mg rectally daily as needed for moderate constipation. (Patient not taking: Reported on 02/14/2023)   [DISCONTINUED] bismuth subsalicylate (PEPTO BISMOL) 262 MG/15ML suspension Take 30 mLs by mouth every 4 (four) hours as needed. (Patient not taking: Reported on 02/14/2023)   [DISCONTINUED] cetirizine (ZYRTEC) 10 MG tablet Take 10 mg by mouth daily. (Patient not taking: Reported on 02/14/2023)   [DISCONTINUED] Cholecalciferol (VITAMIN D3) 50 MCG (2000 UT) TABS Take 2,000 Units by mouth daily. (Patient not taking: Reported on 02/14/2023)   [DISCONTINUED] ELIQUIS  5 MG TABS tablet Take 5 mg by mouth 2 (two) times daily. (Patient not taking: Reported on 02/14/2023)   [DISCONTINUED] ferrous sulfate  325 (65 FE) MG tablet Take 1 tablet (325 mg total) by mouth daily with  breakfast. (Patient not taking: Reported on 02/14/2023)   [DISCONTINUED] Multiple Vitamin (MULTIVITAMIN) tablet Take 1 tablet by mouth daily. (Patient not taking: Reported on 02/14/2023)   [DISCONTINUED] nystatin (MYCOSTATIN/NYSTOP) powder Apply 1 Application topically every 12 (twelve) hours as needed (to groin area for skin breakdown). (Patient not taking: Reported on 02/14/2023)   [DISCONTINUED] ondansetron  (ZOFRAN ) 4 MG tablet Take 4 mg by mouth every 6 (six) hours as needed for nausea or vomiting. (Patient not taking: Reported on 02/14/2023)   [DISCONTINUED] polyethylene glycol powder (GLYCOLAX /MIRALAX ) 17 GM/SCOOP powder Take 1 Container by mouth every other day. (Patient not taking: Reported on 02/14/2023)   [DISCONTINUED] potassium chloride  (KLOR-CON  M) 10 MEQ tablet Take 10 mEq by mouth daily. (Patient not taking: Reported on 02/14/2023)   [DISCONTINUED] triamcinolone cream (KENALOG) 0.1 % Apply 1 Application topically every 8 (eight) hours as needed (for itchy skin and legs). (Patient not taking: Reported on 02/14/2023)   No  facility-administered encounter medications on file as of 02/14/2023.    Review of Systems  Unable to perform ROS: Dementia     Immunization History  Administered Date(s) Administered   Influenza Inj Mdck Quad Pf 10/29/2015, 11/03/2017, 10/30/2018   Influenza, High Dose Seasonal PF 11/30/2022   Influenza-Unspecified 10/26/2014, 11/10/2016, 11/23/2021   Moderna Covid-19 Vaccine Bivalent Booster 78yrs & up 10/30/2020, 07/06/2021   Moderna SARS-COV2 Booster Vaccination 12/25/2019   Moderna Sars-Covid-2 Vaccination 02/20/2019, 03/20/2019, 03/18/2020, 06/26/2020, 12/17/2021   PFIZER(Purple Top)SARS-COV-2 Vaccination 08/07/2019, 08/30/2019   PNEUMOCOCCAL CONJUGATE-20 09/01/2022   Pfizer Covid-19 Vaccine Bivalent Booster 47yrs & up 10/30/2020   Pneumococcal Polysaccharide-23 04/24/2020   Tdap 10/03/2022   Zoster Recombinant(Shingrix) 10/20/2017, 12/21/2017   Pertinent  Health  Maintenance Due  Topic Date Due   HEMOGLOBIN A1C  12/17/2022   FOOT EXAM  04/19/2023   OPHTHALMOLOGY EXAM  02/07/2024   INFLUENZA VACCINE  Completed   DEXA SCAN  Discontinued      10/12/2020   10:54 AM 10/12/2020    8:00 PM 10/13/2020    8:22 AM 07/21/2022    8:41 PM 09/01/2022   11:22 AM  Fall Risk  Falls in the past year?    0 0  Was there an injury with Fall?    0   Fall Risk Category Calculator    0   (RETIRED) Patient Fall Risk Level High fall risk High fall risk High fall risk     Functional Status Survey:    Vitals:   02/14/23 1206  BP: 135/60  Pulse: 70  Resp: 17  Temp: (!) 97.5 F (36.4 C)  SpO2: 95%  Weight: 172 lb 6.4 oz (78.2 kg)  Height: 5' 5 (1.651 m)   Body mass index is 28.69 kg/m. Physical Exam Constitutional:      General: She is not in acute distress.    Appearance: She is well-developed. She is not diaphoretic.  HENT:     Head: Normocephalic and atraumatic.     Mouth/Throat:     Pharynx: No oropharyngeal exudate.  Eyes:     Conjunctiva/sclera: Conjunctivae normal.     Pupils: Pupils are equal, round, and reactive to light.  Cardiovascular:     Rate and Rhythm: Normal rate and regular rhythm.     Heart sounds: Normal heart sounds.  Pulmonary:     Effort: Pulmonary effort is normal.     Breath sounds: Normal breath sounds.  Abdominal:     General: Bowel sounds are normal.     Palpations: Abdomen is soft.  Musculoskeletal:     Cervical back: Normal range of motion and neck supple.     Right lower leg: No edema.     Left lower leg: No edema.  Skin:    General: Skin is warm and dry.  Neurological:     Mental Status: She is alert. She is disoriented.     Motor: Weakness present.     Gait: Gait abnormal.  Psychiatric:        Mood and Affect: Mood normal.     Labs reviewed: Recent Labs    06/16/22 0000 09/13/22 0000 12/14/22 0000  NA 139 139 140  K 4.6 4.2 4.1  CL 105 106 108  CO2 26* 23* 27*  BUN 28* 24* 23*  CREATININE 1.3*  1.3* 1.1  CALCIUM  9.7 9.5 9.0   Recent Labs    04/21/22 0000 06/16/22 0000 12/14/22 0000  AST 17  --  15  ALT 9  --  9  ALKPHOS 73  --  59  ALBUMIN 3.4* 3.8 3.3*   Recent Labs    06/08/22 0000 09/13/22 0000 12/14/22 0000  WBC 5.8 5.5 6.0  NEUTROABS 3,045.00 3,234.00 3,408.00  HGB 10.0* 9.1* 9.7*  HCT 31* 29* 31*  PLT 198 166 180   Lab Results  Component Value Date   TSH 0.83 11/18/2021   Lab Results  Component Value Date   HGBA1C 5.8 06/16/2022   Lab Results  Component Value Date   CHOL 86 12/14/2022   HDL 30 (A) 12/14/2022   LDLCALC 29 12/14/2022   TRIG 207 (A) 12/14/2022    Significant Diagnostic Results in last 30 days:  No results found.  Assessment/Plan 1. PAF (paroxysmal atrial fibrillation) (HCC) (Primary) Rate controlled on metoprolol   2. Depression, major, in remission (HCC) -stable at this time, continues on celexa   3. Chronic diastolic (congestive) heart failure (HCC) No signs of fluid overload. Continues on metoprolol   4. Diabetes mellitus type II, non insulin  dependent (HCC) Diet controlled.   5. Vascular dementia without behavioral disturbance (HCC) -Stable, progressive decline noted, comfort care only at this time Continue supportive care  6. Benign essential hypertension Controlled on metoprolol    7. Osteoarthritis of knee, unspecified laterality, unspecified osteoarthritis type -stable, continues on hydrocodone  BID  8. Hypothyroidism, unspecified type -tsh controlled on synthroid  100 mcg  9. Stage 3b chronic kidney disease (HCC) -stable, dose adjust medication as needed and continue proper hydration.     Rim Thatch K. Caro BODILY [facility] [phone]

## 2023-02-20 ENCOUNTER — Non-Acute Institutional Stay (SKILLED_NURSING_FACILITY): Payer: Self-pay | Admitting: Student

## 2023-02-20 DIAGNOSIS — H353210 Exudative age-related macular degeneration, right eye, stage unspecified: Secondary | ICD-10-CM | POA: Diagnosis not present

## 2023-02-20 DIAGNOSIS — Z515 Encounter for palliative care: Secondary | ICD-10-CM

## 2023-02-20 MED ORDER — LORAZEPAM 0.5 MG PO TABS: 0.5000 mg | ORAL_TABLET | Freq: Three times a day (TID) | ORAL | 0 refills | Status: AC

## 2023-02-20 MED ORDER — MORPHINE SULFATE (CONCENTRATE) 20 MG/ML PO SOLN: 5.0000 mg | ORAL | 0 refills | Status: AC | PRN

## 2023-02-20 NOTE — Progress Notes (Signed)
 Location:  Other Nursing Home Room Number: [facility] [address]A Place of Service:  SNF [phone]) Provider:  Abdul Abdul, Richerd, MD  Patient Care Team: Abdul Richerd, MD as PCP - General [facility] Medicine)  Extended Emergency Contact Information Primary Emergency Contact: Erlene Peyton KIDD Address: [address]          [place], [place] [address] [place] Home Phone: [phone] Work Phone: [phone] Mobile Phone: [phone] Relation: Daughter Secondary Emergency Contact: renie rush Mobile Phone: [phone] Relation: Son  Code Status:  DNR Goals of care: Advanced Directive information    02/14/2023   12:16 PM  Advanced Directives  Does Patient Have a Medical Advance Directive? Yes  Type of Advance Directive Out of facility DNR (pink MOST or yellow form)  Does patient want to make changes to medical advance directive? No - Patient declined     Chief Complaint  Patient presents with   hospice care    HPI:  Pt is a 89 y.o. female seen today for an acute visit for continued decline. Patient is not eating or drinking, and has not for the last few days. She is nonverbal, however, she is alert.  Spoke with her daughter Allyson for 20 minutes regarding her prognosis. Without eating and drinking she is in the days to week range of life expectancy. At this time it is appropriate to discontinue non-essential medications and change to medications she can take as a solution to continue adequate pain and symptom control. She is in agreement.    Past Medical History:  Diagnosis Date   Anemia    iron  def.anemia   Aortic stenosis, severe    Asthma    Chest pain    Colon cancer (HCC)    Complication of anesthesia    nausea   Diabetes mellitus without complication (HCC)    Dyslipidemia    Dyspnea    Gout, joint    H/O postmenopausal osteoporosis    Hematuria    History of palpitations    Hx of colonic polyps    Hypertension    Hypothyroidism    IBS  (irritable bowel syndrome)    Schatzki's ring    Sleep apnea    Past Surgical History:  Procedure Laterality Date   AORTIC VALVE REPLACEMENT     CARDIAC PACEMAKER PLACEMENT     CHOLECYSTECTOMY     COLON SURGERY  1997   RIGHT HEMICOLECTOMY   COLONOSCOPY     COLONOSCOPY WITH PROPOFOL  N/A 09/07/2016   Procedure: COLONOSCOPY WITH PROPOFOL ;  Surgeon: Viktoria Lamar DASEN, MD;  Location: [facility] ENDOSCOPY;  Service: Endoscopy;  Laterality: N/A;   COLONOSCOPY WITH PROPOFOL  N/A 10/11/2020   Procedure: COLONOSCOPY WITH PROPOFOL ;  Surgeon: Unk Corinn Skiff, MD;  Location: [facility] ENDOSCOPY;  Service: Gastroenterology;  Laterality: N/A;   ESOPHAGOGASTRODUODENOSCOPY     ESOPHAGOGASTRODUODENOSCOPY (EGD) WITH PROPOFOL  N/A 10/11/2020   Procedure: ESOPHAGOGASTRODUODENOSCOPY (EGD) WITH PROPOFOL ;  Surgeon: Unk Corinn Skiff, MD;  Location: [facility] ENDOSCOPY;  Service: Gastroenterology;  Laterality: N/A;   GIVENS CAPSULE STUDY  10/11/2020   Procedure: GIVENS CAPSULE STUDY;  Surgeon: Unk Corinn Skiff, MD;  Location: [facility] ENDOSCOPY;  Service: Gastroenterology;;   HIP ARTHROPLASTY Left 09/05/2019   Procedure: ARTHROPLASTY BIPOLAR HIP (HEMIARTHROPLASTY);  Surgeon: Edie Rush PARAS, MD;  Location: [facility] ORS;  Service: Orthopedics;  Laterality: Left;   TEE WITHOUT CARDIOVERSION  03/08/2013    Allergies  Allergen Reactions   Amoxicillin Itching and Rash    Breaks out in a rash On hands On hands  Erythromycin Nausea And Vomiting and Other (See Comments)   Clindamycin/Lincomycin    Tramadol      Outpatient Encounter Medications as of 02/20/2023  Medication Sig   LORazepam  (ATIVAN ) 0.5 MG tablet Take 1 tablet (0.5 mg total) by mouth every 8 (eight) hours.   morphine  (ROXANOL) 20 MG/ML concentrated solution Take 0.25 mLs (5 mg total) by mouth every 4 (four) hours as needed.   [DISCONTINUED] acetaminophen  (TYLENOL ) 500 MG tablet Take 500 mg by mouth 3 (three) times daily.   [DISCONTINUED] atorvastatin  (LIPITOR) 10 MG  tablet Take 10 mg by mouth daily.   [DISCONTINUED] cephALEXin (KEFLEX) 250 MG capsule Take 250 mg by mouth 3 (three) times daily.   [DISCONTINUED] citalopram  (CELEXA ) 10 MG tablet Take 10 mg by mouth daily. Time of day not specified per [facility] EMR system [employer] (see other listings)   [DISCONTINUED] diclofenac  Sodium (VOLTAREN ) 1 % GEL 4 g 4 (four) times daily. To left knee   [DISCONTINUED] estradiol (ESTRACE) 0.1 MG/GM vaginal cream Place 1 Applicatorful vaginally at bedtime. Every Tues and Fri   [DISCONTINUED] famotidine (PEPCID) 10 MG tablet Take 10 mg by mouth at bedtime.   [DISCONTINUED] furosemide  (LASIX ) 20 MG tablet Take 20 mg by mouth daily.   [DISCONTINUED] HYDROcodone -acetaminophen  (NORCO/VICODIN) 5-325 MG tablet Take 1 tablet by mouth in the morning and at bedtime.   [DISCONTINUED] levothyroxine  (SYNTHROID ) 100 MCG tablet Take 100 mcg by mouth at bedtime.   [DISCONTINUED] Lidocaine -Collagen-Aloe Vera 2 % GEL Apply 1 Application topically every 2 (two) hours as needed (to lower lip for lip pain).   [DISCONTINUED] melatonin 5 MG TABS Take 5 mg by mouth at bedtime.   [DISCONTINUED] metoprolol  succinate (TOPROL -XL) 25 MG 24 hr tablet Take 25 mg by mouth 2 (two) times daily.   [DISCONTINUED] senna-docusate (SENOKOT-S) 8.6-50 MG tablet Take 2 tablets by mouth at bedtime.   No facility-administered encounter medications on file as of 02/20/2023.    Review of Systems  Immunization History  Administered Date(s) Administered   Influenza Inj Mdck Quad Pf 10/29/2015, 11/03/2017, 10/30/2018   Influenza, High Dose Seasonal PF 11/30/2022   Influenza-Unspecified 10/26/2014, 11/10/2016, 11/23/2021   Moderna Covid-19 Vaccine Bivalent Booster 23yrs & up 10/30/2020, 07/06/2021   Moderna SARS-COV2 Booster Vaccination 12/25/2019   Moderna Sars-Covid-2 Vaccination 02/20/2019, 03/20/2019, 03/18/2020, 06/26/2020, 12/17/2021   PFIZER(Purple Top)SARS-COV-2 Vaccination 08/07/2019, 08/30/2019    PNEUMOCOCCAL CONJUGATE-20 09/01/2022   Pfizer Covid-19 Vaccine Bivalent Booster 65yrs & up 10/30/2020   Pneumococcal Polysaccharide-23 04/24/2020   Tdap 10/03/2022   Zoster Recombinant(Shingrix) 10/20/2017, 12/21/2017   Pertinent  Health Maintenance Due  Topic Date Due   HEMOGLOBIN A1C  12/17/2022   FOOT EXAM  04/19/2023   OPHTHALMOLOGY EXAM  02/07/2024   INFLUENZA VACCINE  Completed   DEXA SCAN  Discontinued      10/12/2020   10:54 AM 10/12/2020    8:00 PM 10/13/2020    8:22 AM 07/21/2022    8:41 PM 09/01/2022   11:22 AM  Fall Risk  Falls in the past year?    0 0  Was there an injury with Fall?    0   Fall Risk Category Calculator    0   (RETIRED) Patient Fall Risk Level High fall risk High fall risk High fall risk     Functional Status Survey:    There were no vitals filed for this visit. There is no height or weight on file to calculate BMI. Physical Exam Cardiovascular:     Rate and  Rhythm: Normal rate.     Pulses: Normal pulses.  Skin:    General: Skin is warm and dry.  Neurological:     Mental Status: She is alert.     Labs reviewed: Recent Labs    06/16/22 0000 09/13/22 0000 12/14/22 0000  NA 139 139 140  K 4.6 4.2 4.1  CL 105 106 108  CO2 26* 23* 27*  BUN 28* 24* 23*  CREATININE 1.3* 1.3* 1.1  CALCIUM  9.7 9.5 9.0   Recent Labs    04/21/22 0000 06/16/22 0000 12/14/22 0000  AST 17  --  15  ALT 9  --  9  ALKPHOS 73  --  59  ALBUMIN 3.4* 3.8 3.3*   Recent Labs    06/08/22 0000 09/13/22 0000 12/14/22 0000  WBC 5.8 5.5 6.0  NEUTROABS 3,045.00 3,234.00 3,408.00  HGB 10.0* 9.1* 9.7*  HCT 31* 29* 31*  PLT 198 166 180   Lab Results  Component Value Date   TSH 0.83 11/18/2021   Lab Results  Component Value Date   HGBA1C 5.8 06/16/2022   Lab Results  Component Value Date   CHOL 86 12/14/2022   HDL 30 (A) 12/14/2022   LDLCALC 29 12/14/2022   TRIG 207 (A) 12/14/2022    Significant Diagnostic Results in last 30 days:  No results  found.  Assessment/Plan Hospice care - Plan: morphine  (ROXANOL) 20 MG/ML concentrated solution, LORazepam  (ATIVAN ) 0.5 MG tablet  Exudative age-related macular degeneration of right eye, unspecified stage (HCC), Chronic Patient is showing signs of decline. Unable to take crushed medications, will discontinue all medications at this time that are not comfort medications. Ativan  and morphine  as outlined above.   Family/ staff Communication: nursing, Allyson  Labs/tests ordered:  none

## 2023-02-21 ENCOUNTER — Encounter: Payer: Self-pay | Admitting: Student

## 2023-03-11 DEATH — deceased
